# Patient Record
Sex: Male | Born: 1940 | Race: Black or African American | Hispanic: No | Marital: Single | Smoking: Former smoker
Health system: Southern US, Community
[De-identification: ages and names within clinical notes are randomized; demographics above are authoritative.]

## PROBLEM LIST (undated history)

## (undated) DIAGNOSIS — I10 Essential (primary) hypertension: Secondary | ICD-10-CM

## (undated) DIAGNOSIS — Z9049 Acquired absence of other specified parts of digestive tract: Secondary | ICD-10-CM

## (undated) DIAGNOSIS — F0280 Dementia in other diseases classified elsewhere without behavioral disturbance: Secondary | ICD-10-CM

## (undated) DIAGNOSIS — F70 Mild intellectual disabilities: Secondary | ICD-10-CM

## (undated) DIAGNOSIS — F1910 Other psychoactive substance abuse, uncomplicated: Secondary | ICD-10-CM

## (undated) DIAGNOSIS — Z8673 Personal history of transient ischemic attack (TIA), and cerebral infarction without residual deficits: Secondary | ICD-10-CM

## (undated) DIAGNOSIS — I509 Heart failure, unspecified: Secondary | ICD-10-CM

## (undated) DIAGNOSIS — C189 Malignant neoplasm of colon, unspecified: Secondary | ICD-10-CM

## (undated) DIAGNOSIS — G309 Alzheimer's disease, unspecified: Secondary | ICD-10-CM

## (undated) DIAGNOSIS — H409 Unspecified glaucoma: Secondary | ICD-10-CM

## (undated) DIAGNOSIS — F0390 Unspecified dementia without behavioral disturbance: Secondary | ICD-10-CM

## (undated) DIAGNOSIS — E785 Hyperlipidemia, unspecified: Secondary | ICD-10-CM

## (undated) DIAGNOSIS — F32A Depression, unspecified: Secondary | ICD-10-CM

## (undated) DIAGNOSIS — F329 Major depressive disorder, single episode, unspecified: Secondary | ICD-10-CM

## (undated) DIAGNOSIS — M109 Gout, unspecified: Secondary | ICD-10-CM

## (undated) HISTORY — DX: Unspecified dementia, unspecified severity, without behavioral disturbance, psychotic disturbance, mood disturbance, and anxiety: F03.90

## (undated) HISTORY — DX: Hyperlipidemia, unspecified: E78.5

## (undated) HISTORY — DX: Mild intellectual disabilities: F70

## (undated) HISTORY — DX: Malignant neoplasm of colon, unspecified: C18.9

## (undated) HISTORY — DX: Depression, unspecified: F32.A

## (undated) HISTORY — DX: Dementia in other diseases classified elsewhere, unspecified severity, without behavioral disturbance, psychotic disturbance, mood disturbance, and anxiety: F02.80

## (undated) HISTORY — DX: Acquired absence of other specified parts of digestive tract: Z90.49

## (undated) HISTORY — DX: Other psychoactive substance abuse, uncomplicated: F19.10

## (undated) HISTORY — DX: Gout, unspecified: M10.9

## (undated) HISTORY — DX: Essential (primary) hypertension: I10

## (undated) HISTORY — DX: Major depressive disorder, single episode, unspecified: F32.9

## (undated) HISTORY — DX: Unspecified glaucoma: H40.9

## (undated) HISTORY — DX: Alzheimer's disease, unspecified: G30.9

## (undated) HISTORY — DX: Heart failure, unspecified: I50.9

## (undated) HISTORY — DX: Personal history of transient ischemic attack (TIA), and cerebral infarction without residual deficits: Z86.73

---

## 1997-11-14 ENCOUNTER — Encounter (HOSPITAL_COMMUNITY): Admission: RE | Admit: 1997-11-14 | Discharge: 1998-02-12 | Payer: Self-pay | Admitting: Internal Medicine

## 1998-05-03 ENCOUNTER — Encounter: Payer: Self-pay | Admitting: Internal Medicine

## 1998-05-03 ENCOUNTER — Inpatient Hospital Stay (HOSPITAL_COMMUNITY): Admission: EM | Admit: 1998-05-03 | Discharge: 1998-05-07 | Payer: Self-pay

## 2003-10-14 ENCOUNTER — Encounter: Admission: RE | Admit: 2003-10-14 | Discharge: 2003-10-14 | Payer: Self-pay | Admitting: Internal Medicine

## 2004-02-03 ENCOUNTER — Ambulatory Visit: Payer: Self-pay | Admitting: Internal Medicine

## 2008-08-02 ENCOUNTER — Inpatient Hospital Stay (HOSPITAL_COMMUNITY): Admission: EM | Admit: 2008-08-02 | Discharge: 2008-08-18 | Payer: Self-pay | Admitting: Emergency Medicine

## 2008-08-02 ENCOUNTER — Ambulatory Visit: Payer: Self-pay | Admitting: Cardiology

## 2008-08-03 ENCOUNTER — Ambulatory Visit: Payer: Self-pay | Admitting: Gastroenterology

## 2008-08-04 ENCOUNTER — Encounter: Payer: Self-pay | Admitting: Gastroenterology

## 2008-08-05 ENCOUNTER — Encounter (INDEPENDENT_AMBULATORY_CARE_PROVIDER_SITE_OTHER): Payer: Self-pay | Admitting: Internal Medicine

## 2008-08-06 HISTORY — PX: COLECTOMY: SHX59

## 2008-08-07 ENCOUNTER — Encounter (INDEPENDENT_AMBULATORY_CARE_PROVIDER_SITE_OTHER): Payer: Self-pay | Admitting: General Surgery

## 2008-08-12 ENCOUNTER — Ambulatory Visit: Payer: Self-pay | Admitting: Oncology

## 2008-08-19 ENCOUNTER — Ambulatory Visit: Payer: Self-pay | Admitting: Oncology

## 2008-08-27 LAB — COMPREHENSIVE METABOLIC PANEL
Albumin: 3.6 g/dL (ref 3.5–5.2)
Alkaline Phosphatase: 97 U/L (ref 39–117)
CO2: 27 mEq/L (ref 19–32)
Calcium: 8.5 mg/dL (ref 8.4–10.5)
Chloride: 103 mEq/L (ref 96–112)
Glucose, Bld: 237 mg/dL — ABNORMAL HIGH (ref 70–99)
Potassium: 4 mEq/L (ref 3.5–5.3)
Sodium: 141 mEq/L (ref 135–145)
Total Protein: 7.1 g/dL (ref 6.0–8.3)

## 2008-08-27 LAB — CBC WITH DIFFERENTIAL/PLATELET
Eosinophils Absolute: 0.5 10*3/uL (ref 0.0–0.5)
HGB: 11.5 g/dL — ABNORMAL LOW (ref 13.0–17.1)
MONO#: 0.6 10*3/uL (ref 0.1–0.9)
NEUT#: 4.7 10*3/uL (ref 1.5–6.5)
RBC: 3.83 10*6/uL — ABNORMAL LOW (ref 4.20–5.82)
RDW: 16.3 % — ABNORMAL HIGH (ref 11.0–14.6)
WBC: 8.6 10*3/uL (ref 4.0–10.3)
lymph#: 2.8 10*3/uL (ref 0.9–3.3)

## 2008-09-10 ENCOUNTER — Ambulatory Visit (HOSPITAL_COMMUNITY): Admission: RE | Admit: 2008-09-10 | Discharge: 2008-09-10 | Payer: Self-pay | Admitting: Oncology

## 2008-11-19 ENCOUNTER — Emergency Department (HOSPITAL_COMMUNITY): Admission: EM | Admit: 2008-11-19 | Discharge: 2008-11-20 | Payer: Self-pay | Admitting: Emergency Medicine

## 2008-12-21 ENCOUNTER — Ambulatory Visit: Payer: Self-pay | Admitting: Oncology

## 2008-12-23 LAB — CBC WITH DIFFERENTIAL/PLATELET
BASO%: 0.5 % (ref 0.0–2.0)
Basophils Absolute: 0 10*3/uL (ref 0.0–0.1)
EOS%: 8.6 % — ABNORMAL HIGH (ref 0.0–7.0)
Eosinophils Absolute: 0.6 10*3/uL — ABNORMAL HIGH (ref 0.0–0.5)
HCT: 43.4 % (ref 38.4–49.9)
HGB: 14.1 g/dL (ref 13.0–17.1)
LYMPH%: 34.8 % (ref 14.0–49.0)
MCH: 29.7 pg (ref 27.2–33.4)
MCHC: 32.5 g/dL (ref 32.0–36.0)
MCV: 91.4 fL (ref 79.3–98.0)
MONO#: 0.5 10*3/uL (ref 0.1–0.9)
MONO%: 7.1 % (ref 0.0–14.0)
NEUT#: 3.6 10*3/uL (ref 1.5–6.5)
NEUT%: 49 % (ref 39.0–75.0)
Platelets: 325 10*3/uL (ref 140–400)
RBC: 4.75 10*6/uL (ref 4.20–5.82)
RDW: 15.7 % — ABNORMAL HIGH (ref 11.0–14.6)
WBC: 7.4 10*3/uL (ref 4.0–10.3)
lymph#: 2.6 10*3/uL (ref 0.9–3.3)

## 2008-12-23 LAB — COMPREHENSIVE METABOLIC PANEL
BUN: 11 mg/dL (ref 6–23)
CO2: 28 mEq/L (ref 19–32)
Calcium: 8.7 mg/dL (ref 8.4–10.5)
Chloride: 102 mEq/L (ref 96–112)
Creatinine, Ser: 1.01 mg/dL (ref 0.40–1.50)
Total Bilirubin: 0.4 mg/dL (ref 0.3–1.2)

## 2008-12-23 LAB — LACTATE DEHYDROGENASE: LDH: 191 U/L (ref 94–250)

## 2009-04-26 ENCOUNTER — Ambulatory Visit: Payer: Self-pay | Admitting: Oncology

## 2009-04-28 LAB — CBC WITH DIFFERENTIAL/PLATELET
BASO%: 1.2 % (ref 0.0–2.0)
EOS%: 5.7 % (ref 0.0–7.0)
HCT: 45.3 % (ref 38.4–49.9)
LYMPH%: 39.4 % (ref 14.0–49.0)
MCH: 31.3 pg (ref 27.2–33.4)
MCHC: 33.1 g/dL (ref 32.0–36.0)
MCV: 94.5 fL (ref 79.3–98.0)
MONO%: 7.3 % (ref 0.0–14.0)
NEUT%: 46.4 % (ref 39.0–75.0)
Platelets: 298 10*3/uL (ref 140–400)
RBC: 4.79 10*6/uL (ref 4.20–5.82)
WBC: 7.2 10*3/uL (ref 4.0–10.3)

## 2009-04-28 LAB — COMPREHENSIVE METABOLIC PANEL
ALT: 18 U/L (ref 0–53)
Alkaline Phosphatase: 100 U/L (ref 39–117)
CO2: 24 mEq/L (ref 19–32)
Creatinine, Ser: 1.12 mg/dL (ref 0.40–1.50)
Sodium: 142 mEq/L (ref 135–145)
Total Bilirubin: 0.5 mg/dL (ref 0.3–1.2)
Total Protein: 8 g/dL (ref 6.0–8.3)

## 2009-04-28 LAB — CEA: CEA: 1.6 ng/mL (ref 0.0–5.0)

## 2009-07-09 ENCOUNTER — Encounter: Payer: Self-pay | Admitting: Gastroenterology

## 2009-07-14 ENCOUNTER — Telehealth (INDEPENDENT_AMBULATORY_CARE_PROVIDER_SITE_OTHER): Payer: Self-pay | Admitting: *Deleted

## 2009-07-16 ENCOUNTER — Encounter (INDEPENDENT_AMBULATORY_CARE_PROVIDER_SITE_OTHER): Payer: Self-pay | Admitting: *Deleted

## 2009-09-06 ENCOUNTER — Ambulatory Visit (HOSPITAL_COMMUNITY): Admission: RE | Admit: 2009-09-06 | Discharge: 2009-09-06 | Payer: Self-pay | Admitting: Oncology

## 2009-09-13 ENCOUNTER — Ambulatory Visit: Payer: Self-pay | Admitting: Oncology

## 2009-09-14 LAB — CBC WITH DIFFERENTIAL/PLATELET
Basophils Absolute: 0 10*3/uL (ref 0.0–0.1)
EOS%: 5.7 % (ref 0.0–7.0)
Eosinophils Absolute: 0.4 10*3/uL (ref 0.0–0.5)
HCT: 42.3 % (ref 38.4–49.9)
HGB: 14.2 g/dL (ref 13.0–17.1)
MCH: 31.2 pg (ref 27.2–33.4)
MCV: 93 fL (ref 79.3–98.0)
MONO%: 8 % (ref 0.0–14.0)
NEUT#: 3.9 10*3/uL (ref 1.5–6.5)
NEUT%: 50 % (ref 39.0–75.0)
Platelets: 335 10*3/uL (ref 140–400)

## 2009-09-14 LAB — COMPREHENSIVE METABOLIC PANEL
AST: 18 U/L (ref 0–37)
Albumin: 3.8 g/dL (ref 3.5–5.2)
Alkaline Phosphatase: 120 U/L — ABNORMAL HIGH (ref 39–117)
BUN: 16 mg/dL (ref 6–23)
Calcium: 8.8 mg/dL (ref 8.4–10.5)
Chloride: 101 mEq/L (ref 96–112)
Creatinine, Ser: 1.15 mg/dL (ref 0.40–1.50)
Glucose, Bld: 138 mg/dL — ABNORMAL HIGH (ref 70–99)

## 2010-01-06 ENCOUNTER — Ambulatory Visit: Payer: Self-pay | Admitting: Oncology

## 2010-01-11 LAB — CBC WITH DIFFERENTIAL/PLATELET
BASO%: 0.5 % (ref 0.0–2.0)
EOS%: 6.7 % (ref 0.0–7.0)
HCT: 40.3 % (ref 38.4–49.9)
LYMPH%: 34.5 % (ref 14.0–49.0)
MCH: 31.1 pg (ref 27.2–33.4)
MCHC: 32.9 g/dL (ref 32.0–36.0)
MCV: 94.4 fL (ref 79.3–98.0)
MONO%: 8 % (ref 0.0–14.0)
NEUT%: 50.3 % (ref 39.0–75.0)
lymph#: 2.7 10*3/uL (ref 0.9–3.3)

## 2010-01-11 LAB — COMPREHENSIVE METABOLIC PANEL
ALT: 20 U/L (ref 0–53)
AST: 18 U/L (ref 0–37)
Alkaline Phosphatase: 123 U/L — ABNORMAL HIGH (ref 39–117)
Chloride: 99 mEq/L (ref 96–112)
Creatinine, Ser: 1.32 mg/dL (ref 0.40–1.50)
Total Bilirubin: 0.2 mg/dL — ABNORMAL LOW (ref 0.3–1.2)

## 2010-05-06 ENCOUNTER — Ambulatory Visit: Payer: Self-pay | Admitting: Oncology

## 2010-05-13 LAB — COMPREHENSIVE METABOLIC PANEL
ALT: 26 U/L (ref 0–53)
AST: 29 U/L (ref 0–37)
Albumin: 3.8 g/dL (ref 3.5–5.2)
Alkaline Phosphatase: 135 U/L — ABNORMAL HIGH (ref 39–117)
BUN: 17 mg/dL (ref 6–23)
CO2: 27 mEq/L (ref 19–32)
Calcium: 8.6 mg/dL (ref 8.4–10.5)
Chloride: 100 mEq/L (ref 96–112)
Creatinine, Ser: 1.52 mg/dL — ABNORMAL HIGH (ref 0.40–1.50)
Glucose, Bld: 322 mg/dL — ABNORMAL HIGH (ref 70–99)
Potassium: 4.3 mEq/L (ref 3.5–5.3)
Sodium: 137 mEq/L (ref 135–145)
Total Bilirubin: 0.3 mg/dL (ref 0.3–1.2)
Total Protein: 7.8 g/dL (ref 6.0–8.3)

## 2010-05-13 LAB — CBC WITH DIFFERENTIAL/PLATELET
BASO%: 0.5 % (ref 0.0–2.0)
Basophils Absolute: 0 10*3/uL (ref 0.0–0.1)
EOS%: 5.2 % (ref 0.0–7.0)
Eosinophils Absolute: 0.4 10*3/uL (ref 0.0–0.5)
HCT: 40 % (ref 38.4–49.9)
HGB: 13.2 g/dL (ref 13.0–17.1)
LYMPH%: 29.4 % (ref 14.0–49.0)
MCH: 31.5 pg (ref 27.2–33.4)
MCHC: 33 g/dL (ref 32.0–36.0)
MCV: 95.2 fL (ref 79.3–98.0)
MONO#: 0.6 10*3/uL (ref 0.1–0.9)
MONO%: 8.1 % (ref 0.0–14.0)
NEUT#: 4.2 10*3/uL (ref 1.5–6.5)
NEUT%: 56.8 % (ref 39.0–75.0)
Platelets: 296 10*3/uL (ref 140–400)
RBC: 4.21 10*6/uL (ref 4.20–5.82)
RDW: 14.1 % (ref 11.0–14.6)
WBC: 7.4 10*3/uL (ref 4.0–10.3)
lymph#: 2.2 10*3/uL (ref 0.9–3.3)

## 2010-05-13 LAB — CEA: CEA: 1.5 ng/mL (ref 0.0–5.0)

## 2010-05-28 ENCOUNTER — Other Ambulatory Visit: Payer: Self-pay | Admitting: Oncology

## 2010-05-28 DIAGNOSIS — C189 Malignant neoplasm of colon, unspecified: Secondary | ICD-10-CM

## 2010-06-07 NOTE — Progress Notes (Signed)
Summary: Colon  Phone Note Outgoing Call Call back at Home Phone [phone]   Call placed by: Chales Abrahams CMA Duncan Dull),  July 14, 2009 9:08 AM Summary of Call: called to schedule pt for repeat colon left message on machine to call back  Initial call taken by: Chales Abrahams CMA Duncan Dull),  July 14, 2009 9:08 AM  Follow-up for Phone Call        left message on machine to call back Chales Abrahams CMA Duncan Dull)  July 15, 2009 9:17 AM   left message on machine to call back letter mailed  Follow-up by: Chales Abrahams CMA Duncan Dull),  July 16, 2009 10:37 AM

## 2010-06-07 NOTE — Procedures (Signed)
Summary: Recall / [facility]  Recall / [facility]   Imported By: Lennie Odor 10/07/2009 15:42:29  _____________________________________________________________________  External Attachment:    Type:   Image     Comment:   External Document

## 2010-06-07 NOTE — Letter (Signed)
Summary: Appointment Reminder  [facility]  [address] [place], [place] [address]   Phone: [phone]  Fax: [phone]        July 16, 2009 MRN: [id number]    [name] [address] [place], [place]  [address]    Dear [name],   We have been unable to reach you by phone to schedule a follow up   Hospital Colonoscopy that was recommended for you by Dr. Christella Hartigan.  It is   important that we reach you to schedule an appointment. We hope that you  allow [place] to participate in your health care needs. Please contact us at  [phone] at your earliest convenience to schedule your appointment.     Sincerely,    Chales Abrahams CMA (AAMA)  Appended Document: Appointment Reminder letter mailed

## 2010-07-26 LAB — GLUCOSE, CAPILLARY: Glucose-Capillary: 220 mg/dL — ABNORMAL HIGH (ref 70–99)

## 2010-08-16 LAB — GLUCOSE, CAPILLARY: Glucose-Capillary: 111 mg/dL — ABNORMAL HIGH (ref 70–99)

## 2010-08-16 IMAGING — PT NM PET TUM IMG RESTAG (PS) SKULL BASE T - THIGH
6 series · 25 of 25 positions shown · non-contrast
Comparison: 09/10/2008

CLINICAL DATA: Colon cancer. Subsequent treatment strategy.

NUCLEAR MEDICINE PET/CT
TECHNIQUE: 17.9 mCi F-18 FDG was injected intravenously.  Full-
ring PET imaging was performed from the skull base through the mid-
thighs.  CT data was obtained and used for attenuation correction
and anatomic localization only.  (This was not acquired as a
diagnostic CT examination.)  Data regarding site of injection,
patient weight, post injection waiting period, and fasting blood
sugar is available in the PACS documentation.

[Series 1: pet ac · axial · 3.3mm · 4.69mm/px · z∈[-890,-20]mm · 5 of 267 slices shown]
[im 1/267]
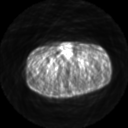
[im 67/267]
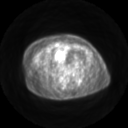
[im 134/267]
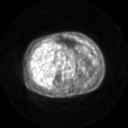
[im 200/267]
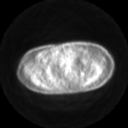
[im 267/267]
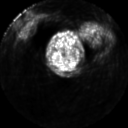

[Series 2: ct images · axial · 3.8mm · 0.98mm/px · z∈[-890,-20]mm · 5 of 263 slices shown]
[im 1/263]
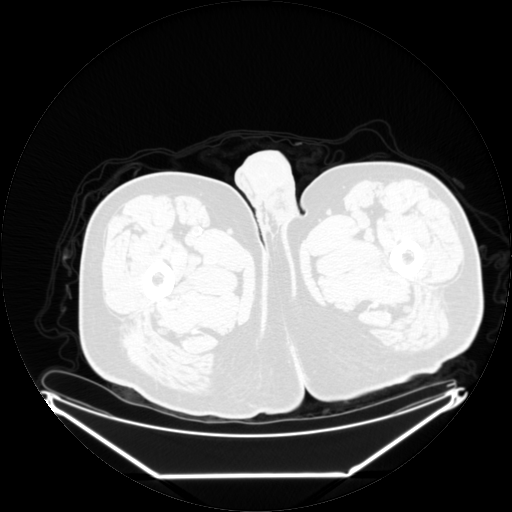
[im 66/263]
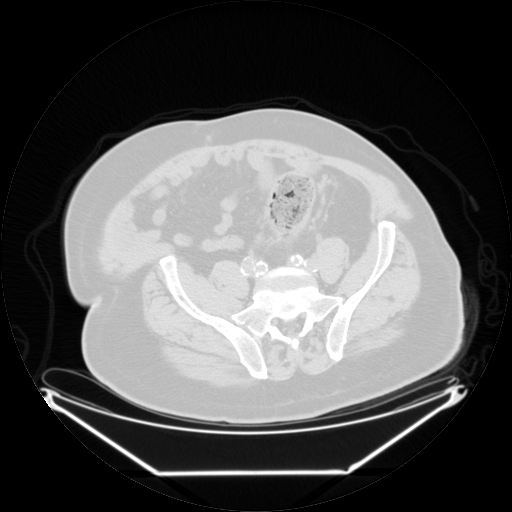
[im 132/263]
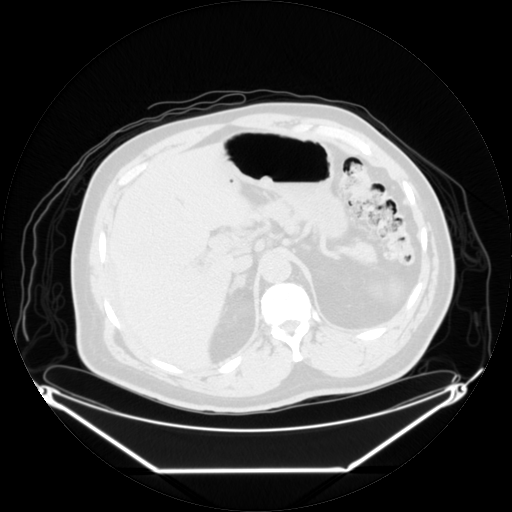
[im 197/263]
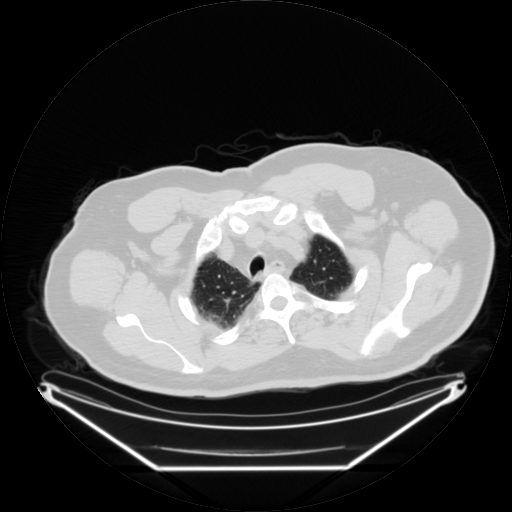
[im 263/263  brain]
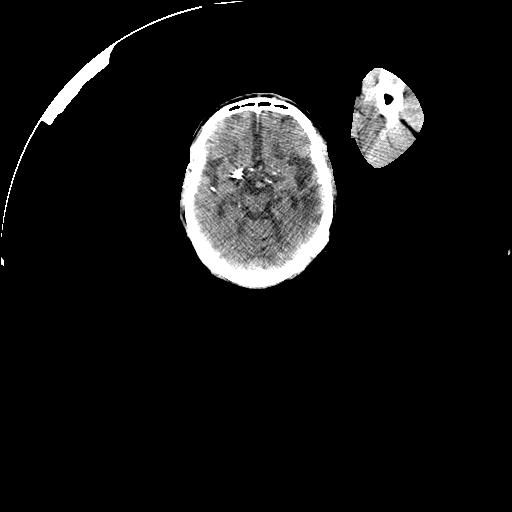

[Series 2: pet nac · axial · 3.3mm · 4.69mm/px · z∈[-890,-20]mm · 6 of 267 slices shown]
[im 1/267]
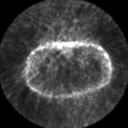
[im 54/267]
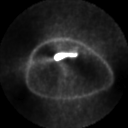
[im 107/267]
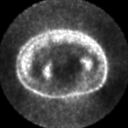
[im 160/267]
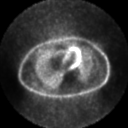
[im 213/267]
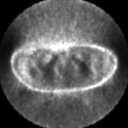
[im 267/267]
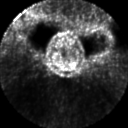

[Series 123: mip · coronal · 3.3mm · 4.69mm/px · 1 of 30 slices shown]
[im 1/30]
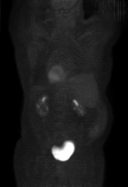

[Series 151: reformatted · axial · 3.3mm · 3.89mm/px · z∈[-890,-20]mm · 6 of 265 slices shown (1 of 2)]
[im 1/265]
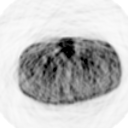
[im 53/265]
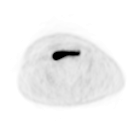
[im 106/265]
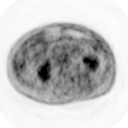
[im 159/265]
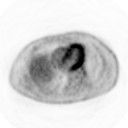
[im 212/265]
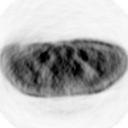
[im 265/265]
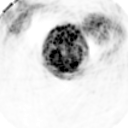

[Series 153: reformatted · coronal · 4.7mm · 6.98mm/px · 2 of 81 slices shown (2 of 2)]
[im 1/81]
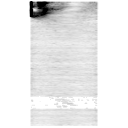
[im 81/81]
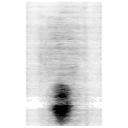

[25 of 25 positions shown; findings below may reference images not displayed]

FINDINGS: The small focus of hypermetabolic activity previously
along the patient's skin of the left back has resolved.

No suspicious hypermetabolic activity is identified in the neck,
chest, abdomen, or pelvis to suggest malignancy.  No significant
abnormal hepatic lesion is noted.  There is atherosclerosis of the
abdominal aorta.

There is evidence of chronic sphenoid and right maxillary
sinusitis.

The gallbladder appears thickened and has multiple internal large
gallstones with nitrogen gas phenomenon.  The stones measure up to
2.3 cm in diameter.

There is an unusual appearance of peripheral layered low density
along the right hepatic lobe on images 132-151 of the CT data.
Density measurement of this vicinity is approximately 20 HU as
compared to 58 HU in the adjacent normal-appearing liver.  This
probably represents geographic focal fatty infiltration of the
liver.

Small juxtadiaphragmatic lymph nodes are present but do not appear
hypermetabolic.

There is prominence of stool in the rectum, such that mild
impaction is not excluded.
IMPRESSION: 1.  No specific hypermetabolic activity to suggest metastatic
disease is identified in the neck, chest, abdomen, or pelvis.
2.  Geographic fatty infiltration of the liver peripherally in the
right hepatic lobe.
3.  Prominent gallbladder appears distended with gallstones.
4.  Prominence of stool in the rectum - cannot exclude mild
impaction.

## 2010-08-17 LAB — CBC
HCT: 31.3 % — ABNORMAL LOW (ref 39.0–52.0)
HCT: 32.9 % — ABNORMAL LOW (ref 39.0–52.0)
HCT: 34.3 % — ABNORMAL LOW (ref 39.0–52.0)
HCT: 34.4 % — ABNORMAL LOW (ref 39.0–52.0)
HCT: 34.8 % — ABNORMAL LOW (ref 39.0–52.0)
Hemoglobin: 10.6 g/dL — ABNORMAL LOW (ref 13.0–17.0)
Hemoglobin: 10.7 g/dL — ABNORMAL LOW (ref 13.0–17.0)
Hemoglobin: 11.2 g/dL — ABNORMAL LOW (ref 13.0–17.0)
Hemoglobin: 11.3 g/dL — ABNORMAL LOW (ref 13.0–17.0)
MCHC: 32.6 g/dL (ref 30.0–36.0)
MCHC: 33 g/dL (ref 30.0–36.0)
MCHC: 33.1 g/dL (ref 30.0–36.0)
MCV: 92.2 fL (ref 78.0–100.0)
MCV: 93.5 fL (ref 78.0–100.0)
MCV: 93.7 fL (ref 78.0–100.0)
MCV: 93.8 fL (ref 78.0–100.0)
Platelets: 362 10*3/uL (ref 150–400)
Platelets: 366 10*3/uL (ref 150–400)
Platelets: 399 10*3/uL (ref 150–400)
RBC: 3.41 MIL/uL — ABNORMAL LOW (ref 4.22–5.81)
RBC: 3.42 MIL/uL — ABNORMAL LOW (ref 4.22–5.81)
RBC: 3.53 MIL/uL — ABNORMAL LOW (ref 4.22–5.81)
RBC: 3.66 MIL/uL — ABNORMAL LOW (ref 4.22–5.81)
RBC: 3.67 MIL/uL — ABNORMAL LOW (ref 4.22–5.81)
RBC: 3.82 MIL/uL — ABNORMAL LOW (ref 4.22–5.81)
RDW: 16 % — ABNORMAL HIGH (ref 11.5–15.5)
RDW: 16.1 % — ABNORMAL HIGH (ref 11.5–15.5)
RDW: 16.2 % — ABNORMAL HIGH (ref 11.5–15.5)
RDW: 16.6 % — ABNORMAL HIGH (ref 11.5–15.5)
WBC: 10 10*3/uL (ref 4.0–10.5)
WBC: 7.3 10*3/uL (ref 4.0–10.5)
WBC: 9.1 10*3/uL (ref 4.0–10.5)
WBC: 9.2 10*3/uL (ref 4.0–10.5)

## 2010-08-17 LAB — COMPREHENSIVE METABOLIC PANEL
ALT: 18 U/L (ref 0–53)
ALT: 48 U/L (ref 0–53)
AST: 18 U/L (ref 0–37)
AST: 27 U/L (ref 0–37)
Albumin: 2.7 g/dL — ABNORMAL LOW (ref 3.5–5.2)
Alkaline Phosphatase: 112 U/L (ref 39–117)
BUN: 4 mg/dL — ABNORMAL LOW (ref 6–23)
CO2: 29 mEq/L (ref 19–32)
CO2: 29 mEq/L (ref 19–32)
Calcium: 8.6 mg/dL (ref 8.4–10.5)
Chloride: 103 mEq/L (ref 96–112)
Chloride: 106 mEq/L (ref 96–112)
Creatinine, Ser: 0.86 mg/dL (ref 0.4–1.5)
Creatinine, Ser: 0.97 mg/dL (ref 0.4–1.5)
GFR calc Af Amer: >60 mL/min (ref >60)
GFR calc Af Amer: >60 mL/min (ref >60)
GFR calc non Af Amer: >60 mL/min (ref >60)
GFR calc non Af Amer: >60 mL/min (ref >60)
Potassium: 4 mEq/L (ref 3.5–5.1)
Sodium: 135 mEq/L (ref 135–145)
Sodium: 140 mEq/L (ref 135–145)
Total Bilirubin: 0.4 mg/dL (ref 0.3–1.2)
Total Bilirubin: 0.6 mg/dL (ref 0.3–1.2)
Total Protein: 6.4 g/dL (ref 6.0–8.3)
Total Protein: 7.3 g/dL (ref 6.0–8.3)

## 2010-08-17 LAB — GLUCOSE, CAPILLARY
Glucose-Capillary: 128 mg/dL — ABNORMAL HIGH (ref 70–99)
Glucose-Capillary: 138 mg/dL — ABNORMAL HIGH (ref 70–99)
Glucose-Capillary: 143 mg/dL — ABNORMAL HIGH (ref 70–99)
Glucose-Capillary: 146 mg/dL — ABNORMAL HIGH (ref 70–99)
Glucose-Capillary: 149 mg/dL — ABNORMAL HIGH (ref 70–99)
Glucose-Capillary: 170 mg/dL — ABNORMAL HIGH (ref 70–99)
Glucose-Capillary: 174 mg/dL — ABNORMAL HIGH (ref 70–99)
Glucose-Capillary: 176 mg/dL — ABNORMAL HIGH (ref 70–99)
Glucose-Capillary: 176 mg/dL — ABNORMAL HIGH (ref 70–99)
Glucose-Capillary: 177 mg/dL — ABNORMAL HIGH (ref 70–99)
Glucose-Capillary: 177 mg/dL — ABNORMAL HIGH (ref 70–99)
Glucose-Capillary: 184 mg/dL — ABNORMAL HIGH (ref 70–99)
Glucose-Capillary: 189 mg/dL — ABNORMAL HIGH (ref 70–99)
Glucose-Capillary: 191 mg/dL — ABNORMAL HIGH (ref 70–99)
Glucose-Capillary: 194 mg/dL — ABNORMAL HIGH (ref 70–99)
Glucose-Capillary: 199 mg/dL — ABNORMAL HIGH (ref 70–99)
Glucose-Capillary: 202 mg/dL — ABNORMAL HIGH (ref 70–99)
Glucose-Capillary: 203 mg/dL — ABNORMAL HIGH (ref 70–99)
Glucose-Capillary: 208 mg/dL — ABNORMAL HIGH (ref 70–99)
Glucose-Capillary: 210 mg/dL — ABNORMAL HIGH (ref 70–99)
Glucose-Capillary: 211 mg/dL — ABNORMAL HIGH (ref 70–99)
Glucose-Capillary: 216 mg/dL — ABNORMAL HIGH (ref 70–99)
Glucose-Capillary: 230 mg/dL — ABNORMAL HIGH (ref 70–99)
Glucose-Capillary: 231 mg/dL — ABNORMAL HIGH (ref 70–99)
Glucose-Capillary: 237 mg/dL — ABNORMAL HIGH (ref 70–99)
Glucose-Capillary: 247 mg/dL — ABNORMAL HIGH (ref 70–99)
Glucose-Capillary: 248 mg/dL — ABNORMAL HIGH (ref 70–99)
Glucose-Capillary: 252 mg/dL — ABNORMAL HIGH (ref 70–99)
Glucose-Capillary: 255 mg/dL — ABNORMAL HIGH (ref 70–99)
Glucose-Capillary: 279 mg/dL — ABNORMAL HIGH (ref 70–99)
Glucose-Capillary: 284 mg/dL — ABNORMAL HIGH (ref 70–99)
Glucose-Capillary: 296 mg/dL — ABNORMAL HIGH (ref 70–99)
Glucose-Capillary: 305 mg/dL — ABNORMAL HIGH (ref 70–99)
Glucose-Capillary: 308 mg/dL — ABNORMAL HIGH (ref 70–99)
Glucose-Capillary: 324 mg/dL — ABNORMAL HIGH (ref 70–99)
Glucose-Capillary: 325 mg/dL — ABNORMAL HIGH (ref 70–99)
Glucose-Capillary: 364 mg/dL — ABNORMAL HIGH (ref 70–99)

## 2010-08-17 LAB — BASIC METABOLIC PANEL
BUN: 2 mg/dL — ABNORMAL LOW (ref 6–23)
BUN: 3 mg/dL — ABNORMAL LOW (ref 6–23)
BUN: 3 mg/dL — ABNORMAL LOW (ref 6–23)
CO2: 26 mEq/L (ref 19–32)
CO2: 26 mEq/L (ref 19–32)
CO2: 27 mEq/L (ref 19–32)
CO2: 29 mEq/L (ref 19–32)
CO2: 29 mEq/L (ref 19–32)
CO2: 30 mEq/L (ref 19–32)
CO2: 31 mEq/L (ref 19–32)
Calcium: 8.1 mg/dL — ABNORMAL LOW (ref 8.4–10.5)
Calcium: 8.6 mg/dL (ref 8.4–10.5)
Calcium: 8.6 mg/dL (ref 8.4–10.5)
Chloride: 101 mEq/L (ref 96–112)
Chloride: 103 mEq/L (ref 96–112)
Chloride: 103 mEq/L (ref 96–112)
Chloride: 103 mEq/L (ref 96–112)
Chloride: 104 mEq/L (ref 96–112)
Chloride: 105 mEq/L (ref 96–112)
Creatinine, Ser: 0.89 mg/dL (ref 0.4–1.5)
Creatinine, Ser: 1.06 mg/dL (ref 0.4–1.5)
GFR calc Af Amer: >60 mL/min (ref >60)
GFR calc Af Amer: >60 mL/min (ref >60)
GFR calc Af Amer: >60 mL/min (ref >60)
GFR calc Af Amer: >60 mL/min (ref >60)
GFR calc Af Amer: >60 mL/min (ref >60)
GFR calc Af Amer: >60 mL/min (ref >60)
GFR calc non Af Amer: >60 mL/min (ref >60)
GFR calc non Af Amer: >60 mL/min (ref >60)
GFR calc non Af Amer: >60 mL/min (ref >60)
GFR calc non Af Amer: >60 mL/min (ref >60)
GFR calc non Af Amer: >60 mL/min (ref >60)
Glucose, Bld: 112 mg/dL — ABNORMAL HIGH (ref 70–99)
Glucose, Bld: 182 mg/dL — ABNORMAL HIGH (ref 70–99)
Glucose, Bld: 185 mg/dL — ABNORMAL HIGH (ref 70–99)
Glucose, Bld: 234 mg/dL — ABNORMAL HIGH (ref 70–99)
Glucose, Bld: 299 mg/dL — ABNORMAL HIGH (ref 70–99)
Glucose, Bld: 323 mg/dL — ABNORMAL HIGH (ref 70–99)
Potassium: 3.4 mEq/L — ABNORMAL LOW (ref 3.5–5.1)
Potassium: 3.4 mEq/L — ABNORMAL LOW (ref 3.5–5.1)
Potassium: 3.8 mEq/L (ref 3.5–5.1)
Potassium: 3.8 mEq/L (ref 3.5–5.1)
Potassium: 3.9 mEq/L (ref 3.5–5.1)
Potassium: 4 mEq/L (ref 3.5–5.1)
Potassium: 4.2 mEq/L (ref 3.5–5.1)
Sodium: 133 mEq/L — ABNORMAL LOW (ref 135–145)
Sodium: 135 mEq/L (ref 135–145)
Sodium: 137 mEq/L (ref 135–145)
Sodium: 138 mEq/L (ref 135–145)
Sodium: 138 mEq/L (ref 135–145)
Sodium: 139 mEq/L (ref 135–145)
Sodium: 139 mEq/L (ref 135–145)

## 2010-08-17 LAB — DIFFERENTIAL
Basophils Absolute: 0 10*3/uL (ref 0.0–0.1)
Basophils Relative: 1 % (ref 0–1)
Eosinophils Absolute: 0.7 10*3/uL (ref 0.0–0.7)
Eosinophils Relative: 7 % — ABNORMAL HIGH (ref 0–5)
Lymphocytes Relative: 25 % (ref 12–46)
Lymphs Abs: 2.7 10*3/uL (ref 0.7–4.0)
Monocytes Absolute: 0.7 10*3/uL (ref 0.1–1.0)
Monocytes Relative: 9 % (ref 3–12)
Neutro Abs: 4.8 10*3/uL (ref 1.7–7.7)

## 2010-08-17 LAB — URINALYSIS, ROUTINE W REFLEX MICROSCOPIC
Protein, ur: 100 mg/dL — AB
Specific Gravity, Urine: 1.017 (ref 1.005–1.030)
Urobilinogen, UA: 0.2 mg/dL (ref 0.0–1.0)

## 2010-08-17 LAB — URINE MICROSCOPIC-ADD ON

## 2010-08-17 LAB — PHOSPHORUS: Phosphorus: 3.5 mg/dL (ref 2.3–4.6)

## 2010-08-17 LAB — CHOLESTEROL, TOTAL: Cholesterol: 116 mg/dL (ref 0–200)

## 2010-08-17 LAB — TRIGLYCERIDES: Triglycerides: 96 mg/dL (ref <150)

## 2010-08-17 LAB — PREALBUMIN: Prealbumin: 13.3 mg/dL — ABNORMAL LOW (ref 18.0–45.0)

## 2010-08-18 LAB — CBC
HCT: 32.2 % — ABNORMAL LOW (ref 39.0–52.0)
HCT: 32.8 % — ABNORMAL LOW (ref 39.0–52.0)
HCT: 37.2 % — ABNORMAL LOW (ref 39.0–52.0)
Hemoglobin: 11.8 g/dL — ABNORMAL LOW (ref 13.0–17.0)
MCHC: 31.8 g/dL (ref 30.0–36.0)
MCHC: 32.5 g/dL (ref 30.0–36.0)
MCV: 92.6 fL (ref 78.0–100.0)
Platelets: 329 10*3/uL (ref 150–400)
Platelets: 336 10*3/uL (ref 150–400)
Platelets: ADEQUATE 10*3/uL (ref 150–400)
RDW: 16 % — ABNORMAL HIGH (ref 11.5–15.5)
RDW: 16.5 % — ABNORMAL HIGH (ref 11.5–15.5)
RDW: 16.7 % — ABNORMAL HIGH (ref 11.5–15.5)
WBC: 8.6 10*3/uL (ref 4.0–10.5)

## 2010-08-18 LAB — LIPID PANEL
Triglycerides: 58 mg/dL (ref <150)
VLDL: 12 mg/dL (ref 0–40)

## 2010-08-18 LAB — PROTIME-INR
INR: 1.1 (ref 0.00–1.49)
Prothrombin Time: 14.6 seconds (ref 11.6–15.2)

## 2010-08-18 LAB — DIFFERENTIAL
Basophils Absolute: 0.1 10*3/uL (ref 0.0–0.1)
Eosinophils Absolute: 0.6 10*3/uL (ref 0.0–0.7)
Lymphs Abs: 2.6 10*3/uL (ref 0.7–4.0)
Neutro Abs: 5.6 10*3/uL (ref 1.7–7.7)

## 2010-08-18 LAB — BASIC METABOLIC PANEL
BUN: 12 mg/dL (ref 6–23)
BUN: 3 mg/dL — ABNORMAL LOW (ref 6–23)
CO2: 27 mEq/L (ref 19–32)
CO2: 27 mEq/L (ref 19–32)
CO2: 30 mEq/L (ref 19–32)
Calcium: 8.3 mg/dL — ABNORMAL LOW (ref 8.4–10.5)
Chloride: 104 mEq/L (ref 96–112)
Creatinine, Ser: 0.94 mg/dL (ref 0.4–1.5)
GFR calc Af Amer: >60 mL/min (ref >60)
Glucose, Bld: 143 mg/dL — ABNORMAL HIGH (ref 70–99)
Glucose, Bld: 173 mg/dL — ABNORMAL HIGH (ref 70–99)
Glucose, Bld: 357 mg/dL — ABNORMAL HIGH (ref 70–99)
Potassium: 3.8 mEq/L (ref 3.5–5.1)
Potassium: 4.2 mEq/L (ref 3.5–5.1)
Sodium: 135 mEq/L (ref 135–145)

## 2010-08-18 LAB — IRON AND TIBC
Iron: 40 ug/dL — ABNORMAL LOW (ref 42–135)
TIBC: 272 ug/dL (ref 215–435)

## 2010-08-18 LAB — HEPATIC FUNCTION PANEL
ALT: 14 U/L (ref 0–53)
AST: 15 U/L (ref 0–37)
Alkaline Phosphatase: 90 U/L (ref 39–117)
Bilirubin, Direct: 0.1 mg/dL (ref 0.0–0.3)
Total Bilirubin: 0.5 mg/dL (ref 0.3–1.2)

## 2010-08-18 LAB — CEA: CEA: 35.5 ng/mL — ABNORMAL HIGH (ref 0.0–5.0)

## 2010-08-18 LAB — VITAMIN B12: Vitamin B-12: 223 pg/mL (ref 211–911)

## 2010-08-18 LAB — GLUCOSE, CAPILLARY
Glucose-Capillary: 167 mg/dL — ABNORMAL HIGH (ref 70–99)
Glucose-Capillary: 174 mg/dL — ABNORMAL HIGH (ref 70–99)
Glucose-Capillary: 189 mg/dL — ABNORMAL HIGH (ref 70–99)
Glucose-Capillary: 224 mg/dL — ABNORMAL HIGH (ref 70–99)
Glucose-Capillary: 242 mg/dL — ABNORMAL HIGH (ref 70–99)

## 2010-08-18 LAB — RETICULOCYTES: Retic Count, Absolute: 64.6 10*3/uL (ref 19.0–186.0)

## 2010-08-18 LAB — TSH: TSH: 1.897 u[IU]/mL (ref 0.350–4.500)

## 2010-09-19 ENCOUNTER — Inpatient Hospital Stay (HOSPITAL_COMMUNITY): Admission: RE | Admit: 2010-09-19 | Payer: Self-pay | Source: Ambulatory Visit

## 2010-09-20 NOTE — Discharge Summary (Signed)
[name], [name]             ACCOUNT NO.:  [id number]   MEDICAL RECORD NO.:  [id number]          PATIENT TYPE:  INP   LOCATION:  1336                         FACILITY:  [facility]   PHYSICIAN:  Isidor Holts, M.D.  DATE OF BIRTH:  Jun 03, 1940   DATE OF ADMISSION:  08/02/2008  DATE OF DISCHARGE:                               DISCHARGE SUMMARY   ADDENDUM:   DATE OF DISCHARGE:  To be determined.   DISCHARGE DIAGNOSES:  1. T3 N1 sigmoid colon carcinoma, status post sigmoidectomy with      primary anastomosis August 07, 2008.  2. Postoperative ileus.  3. Hypertension.  4. History of congenital heart disease.  5. History of cerebrovascular disease.  6. Type 2 diabetes mellitus, insulin-requiring.  7. Gout.  8. Dyslipidemia.  9. Dementia/depression.  10.Mental retardation.  11.Glaucoma.  12.History of polycystic kidneys.  13.Urinary tract infection.   DISCHARGE MEDICATIONS:  1. Aspirin 81 mg p.o. daily.  2. Zyloprim 100 mg p.o. daily (was on 100 mg p.o. alternate days).  3. Altace 2.5 mg p.o. daily.  4. Namenda 10 mg p.o. b.i.d.  5. Aricept 10 mg p.o. nightly.  6. Travatan 0.004% ophthalmic solution 1 drop each eye nightly.  7. Betoptic S 0.25% ophthalmic drops 1 drop each eye 2 times daily.  8. Zocor 40 mg p.o. nightly.  9. Levaquin 500 mg p.o. daily, to be completed on August 23, 2008.  10.Lantus insulin 50 units subcutaneously every q.12 hourly (was on 36      units subcutaneously every morning and 66 units subcutaneously      every evening).  11.NovoLog insulin per slining scale(was on NovoLog 5 units      subcutaneously b.i.d).  ie CBG 70-120, No insulin; CBG 121-150, 3 units; CBG 151-200, 4 units;  CBG 201-250, 7 units; CBG 251-300, 11 units; 301-350, 15 units; 351-400,  20 units.   Note:  Potassium chloride, Maxzide, Iron sulfate and Adalat have been  discontinued, until reviewed by Dr. Frederik Pear, primary MD.   This medication list may, of course, be  updated/modified at the time of  actual discharge.   CLINICAL COURSE:  For details of admission history, refer to admission  notes of August 02, 2008, dictated by Dr. Vania Rea.  Also refer  to consultation notes of  August 04, 2008, dictated by Dr. Claud Kelp  and of August 06, 2008, dictated by Dr. Antonietta Breach.  For details of  surgical procedure, refer to operative note of August 07, 2008, dictated  by Dr. Claud Kelp.  For details of subsequent clinical course, refer  to interim summary dictated August 11, 2008, by Dr. Theodosia Paling.  For  the period, however, from August 12, 2008 to August 16, 2008, i.e. the date  of this dictation, the following are pertinent:   The patient did develop postoperative ileus following his surgery.  He  was, therefore, continued on NG tube suction, intravenous fluids and  parenteral TNA.  By August 14, 2008, however, postoperative ileus had  resolved and patient was able to tolerate diet, which was subsequently  advanced and TNA was discontinued on  August 15, 2008.  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  Oncology consultation was kindly provided by Dr. Eli Hose.  For  details of that consultation, refer to consultation notes of August 12, 2008.  Dr. Clelia Croft recommended continued supportive care, but will see  the patient on an outpatient basis and will arrange PET/CT scan for  restaging workup.  The decision as to whether or not patient will  benefit from chemotherapy will depend on his staging CT scans, as well  as discussion with his family regarding risks and benefits, although he  is felt not to be a great candidate for systemic chemotherapy, because  of his poor performance status and multiple comorbidities, as well as  diminished capacity and limited quality of life.  Hypertension remained  controlled during the course of patient's hospitalization.  There were  no problems  referable to gout.  He continues on pre-admission doses of  statin and has remained stable from the point of view of his dementia.  Diabetes mellitus has proven somewhat difficult to control, particularly  during the period of time with patient was on parenteral TNA.  However,  with discontinuation of TNA and titration of patient's scheduled Lantus  insulin as well as sliding-scale insulin coverage, it is anticipated  that over the next few days this will come satisfactorily under control.  The patient was noted to have a positive urinary sediment following  urinalysis on August 15, 2008, with many bacteria.  He, however, has  urinary tract symptoms.  Foley catheter was discontinued on August 15, 2008.  This is, therefore, asymptomatic bacteriuria.  However, given the  fact that patient is diabetic, we have elected to treat with a 7-day  course of Levaquin.   PROCEDURES:  1. Abdominal x-ray dated August 10, 2008.  This showed postoperative      ileus without evidence for free intraperitoneal air.  2. Chest x-ray dated August 11, 2008.  This showed right upper extremity      PICC line in satisfactory position, tip in distal SVC, no      pneumothorax identified.  3. Chest x-ray dated August 15, 2008.  This showed PICC line now      terminating in mid right subclavian vein, low lung volumes without      focal airspace disease.   DISPOSITION:  The patient on August 16, 2008, was clearly nearing  discharge.  Ileus had resolved.  The patient was asymptomatic and was  ambulating with assistance.  The only issue is of uncontrolled diabetes  mellitus, but as mentioned above, insulin regimen is being titrated  accordingly.  It is anticipated that in the next few days the patient  will recover sufficiently clinically and provided he is cleared by the  surgical team, he will be discharged.  This is anticipated to occur on  or about August 18, 2008.   DIET:  Heart-healthy/carbohydrate modified.    ACTIVITY:  As tolerated, otherwise per PT/OT.   WOUND CARE:  Per surgical recommendations.   FOLLOW-UP INSTRUCTIONS:  The patient is to follow up with his primary  MD, Dr. Frederik Pear, [facility], per prior scheduled  appointment, routinely.  He is to follow up with Dr. Eli Hose,  oncologist, on a date to be scheduled, at the [facility],  telephone number [phone].  In addition, he is to follow up with Dr.  Claud Kelp, general surgeon, on a date to be  determined.  Appropriate information has been provided.      Isidor Holts, M.D.  Electronically Signed     CO/MEDQ  D:  08/16/2008  T:  08/16/2008  Job:  [id number]   cc:   [name]. [name], M.D.  Fax: [phone]   Blenda Nicely. [place]  Fax: [phone]   Angelia Mould. Derrell Lolling, M.D.  [address], [address]  [place]  [place] [address]

## 2010-09-20 NOTE — H&P (Signed)
NAMEHURLEY, SOBEL NO.:  [id number]   MEDICAL RECORD NO.:  [id number]          PATIENT TYPE:  EMS   LOCATION:  ED                           [facility]ACILITY:  [facility]   PHYSICIAN:  Vania Rea, M.D. DATE OF BIRTH:  Oct 13, 1940   DATE OF ADMISSION:  08/02/2008  DATE OF DISCHARGE:                              HISTORY & PHYSICAL   CHIEF COMPLAINT:  Bloody stool.   HISTORY OF PRESENT ILLNESS:  This is a 70 year old gentleman with a  history of Alzheimer's dementia, diabetes, stroke, polycystic kidney  among other multiple medical problems who lives at [facility] assisted  living facility and was brought to the emergency room because of an  incidental finding of maroon-colored stool and blood clots in his  diapers.   The patient has no complaints.  There is no history of chest pains or  shortness breath.  There is no history of headaches.  There is no  history of syncope.  He does ambulate with the assistance of a walker.  The patient's code status is do not resuscitate, but the a nursing home  and family indicate that they want everything done to evaluate the  patient as to the cause of his bleeding.   PAST MEDICAL HISTORY:  1. Hypertension.  2. Diabetes.  3. Cerebrovascular disease.  4. History of congenital heart disease.  5. Alzheimer's dementia.  6. Depression.  7. Glaucoma.  8. Gout.  9. Hyperlipidemia.  10.History of polycystic kidney.   MEDICATIONS:  1. Lantus 36 units each morning and 66 units each afternoon.  2. NovoLog 5 units twice daily.  3. Zyloprim 100 mg every other day.  4. Aspirin 81 mg daily.  5. Potassium chloride 10 mEq daily.  6. Maxzide 25/325 daily.  7. Iron sulfate 325 mg daily.  8. Altace 2.5 mg daily.  9. Adalat 60 mg daily.  10.Namenda 10 mg twice daily.  11.Aricept 10 mg at bedtime.  12.Zocor 40 mg at bedtime.  13.Travatan 0.004% eye drops 1 drop in each eye at bedtime.  14.Betoptic 0.25% eye drops 1 drop each eye twice  daily.   ALLERGIES:  NO KNOWN DRUG ALLERGIES.   SOCIAL HISTORY:  He is a nursing home resident.  Otherwise unable to  obtain because of the patient's status and this information is not  listed in the patient's medical records.   FAMILY HISTORY:  Unable to obtain.   REVIEW OF SYSTEMS:  Other than noted above, unable to obtain.   PHYSICAL EXAMINATION:  GENERAL:  This is a pleasant, elderly African  American gentleman, reclining in the office stretcher, in no acute  distress.  He is not oriented to place or time but he is oriented to  person.  He has no idea why he is here or where he is.  VITAL SIGNS:  His temperature is 97.  His pulse 90, respirations 20,  blood pressure 123/74.  He is saturating 99% room air.  HEENT:  His pupils are round and equal.  Mucous membranes pink.  Anicteric.  NECK:  No cervical lymphadenopathy or thyromegaly.  No jugular venous  distention.  No oral lesions.  No carotid bruit.  CHEST:  Clear to auscultation bilaterally.  CARDIOVASCULAR: Distant heart sounds but regular rhythm.  ABDOMEN:  Quite obese and distended by fat.  No tenderness.  No masses  felt.  EXTREMITIES:  Without edema, has 2+ dorsalis pedis pulses bilaterally  and they are equal.  CENTRAL NERVOUS SYSTEM:  At rest he has a slight left facial droop but  it disappears with smiling.  No cranial nerve deficits noted.  His power  is grade 5 throughout.  No focal neurologic signs noted.   LABORATORY DATA:  His white count is 10.2, hemoglobin 11.8, MCV 92.6,  platelets 194,000.  His sodium is 135, potassium 4.2, chloride 100, CO2  27, BUN 18, creatinine 1.31, calcium 8.7.  His glucose is 357.   ASSESSMENT:  1. Acute gastrointestinal bleeding, lower versus upper.  2. Diabetes type 2, uncontrolled.  3. Chronic renal insufficiency.  4. History of hypertension, controlled.  5. History of gout.  6. History of hyperlipidemia.   PLAN:  Will admit this gentleman for monitoring  serial hemoglobins  and  will request evaluation by gastrointestinal service with a view to  eliciting the source of his bleeding and applying the appropriate  remedy.  Will continue general medical support for the patient's other  medical problems.  Other plans as per orders.      Vania Rea, M.D.  Electronically Signed    LC/MEDQ  D:  08/02/2008  T:  08/03/2008  Job:  [id number]   cc:   [name] [name], M.D.  Fax: [phone]

## 2010-09-20 NOTE — Consult Note (Signed)
NAMEJARIS, [name]             ACCOUNT NO.:  [id number]   MEDICAL RECORD NO.:  [id number]          PATIENT TYPE:  INP   LOCATION:  1427                         FACILITY:  [facility]   PHYSICIAN:  Angelia Mould. Derrell Lolling, M.D.DATE OF BIRTH:  17-Nov-1940   DATE OF CONSULTATION:  08/04/2008  DATE OF DISCHARGE:                                 CONSULTATION   REASON FOR CONSULTATION:  Evaluate malignant-appearing sigmoid colon  mass.   HISTORY:  This is a 70 year old African American gentleman with a  history of disabling Alzheimer's dementia, insulin-dependent diabetes,  stroke, polycystic disease, hypertension, and some type of congenital  heart disease.  He lives at [facility] facility.  He  has been in a nursing facility of some type for 15 years since his  stroke. He was brought to the emergency room because of a finding of  maroon-colored stool and blood clots in his diapers.  Apparently there  was not any abdominal pain, nausea or vomiting.  He does take aspirin.   He was admitted on August 02, 2008, by the [facility] hospitalist team.  They noted no history of chest pain or shortness of breath or syncope.  He does apparently ambulate with a walker.  His code status is do not  resuscitate, but the nursing home and family indicated they wanted  everything done to evaluate the patient as to the cause of his bleeding.  He is stable at this time.   The patient was evaluated by Dr. Wendall Papa and underwent a colonoscopy  today.  The colonoscopy shows a bulky malignant-appearing mass in the  sigmoid colon starting at about 20 cm.  He also had some other colon  polyps which Dr. Christella Hartigan biopsied and feels are most likely benign.  The  patient is stable at this time post endoscopy and awake.   PAST MEDICAL HISTORY:  1. Alzheimer's dementia.  2. Hypertension.  3. Insulin-dependent diabetes.  4. Cerebrovascular disease with history of stroke.  5. History of congenital heart  disease, specifics unknown.  6. Depression.  7. Glaucoma.  8. Gout.  9. Hyperlipidemia.  10.History of polycystic kidney.   SURGERY:  I do not have any history of any surgical procedures in his  records.   CURRENT MEDICATIONS:  1. Lantus insulin 36 units in the morning and 66 units in the      afternoon.  2. NovoLog 5 units twice daily.  3. Zyloprim 100 mg every other day.  4. Aspirin 81 mg daily.  5. Potassium chloride 10 mEq daily.  6. Maxzide 25/325 mg daily.  7. Iron sulfate 325 mg daily.  8. Altace 2.5 mg daily.  9. Adalat 60 mg daily.  10.Namenda 10 mg twice daily.  11.Aricept 10 mg at bedtime.  12.Zocor 40 mg at bedtime.  13.Travatan 0.004% eye drops 1 drop each eye at bedtime.  14.Betoptic 0.25% eye drops in each eye 1 drop twice daily.   ALLERGIES:  NONE KNOWN.   SOCIAL HISTORY:  He is a nursing-home resident.  I do not have any  history of smoking or alcohol use.   FAMILY HISTORY:  Not able to obtain.  The patient has poor memory.   REVIEW OF SYSTEMS:  Other than noted above unable to obtain.   PHYSICAL EXAMINATION:  GENERAL:  A pleasant older African American  gentleman who is overweight, in bed and in no distress whatsoever.  MOST RECENT VITAL SIGNS:  Temperature 98.1, blood pressure 126/77, pulse  88, respiratory rate 20, oxygen saturation 96% on room air.  HEENT:  Eyes:  Sclerae are clear.  Extraocular movements intact.  Anicteric.  Ear, nose, mouth and throat, nose, lips, tongue and  oropharynx are without gross lesions.  NECK:  No adenopathy.  No thyromegaly.  No jugular venous distention.  No bruits.  LUNGS:  Clear to auscultation bilaterally.  No chest wall tenderness.  HEART:  Distant heart sounds, regular rhythm.  No ectopy.  ABDOMEN:  Obese, soft, nontender.  I do not see any scars.  I do not  feel any hernias.  I do not feel any masses.  Liver and spleen not  enlarged.  EXTREMITIES:  No edema.  Warm and dry.  NEUROLOGIC:  He has a slight left  facial droop which disappears with  smiling.  No cranial nerve defects.  No focal motor or sensory  abnormalities of the extremities.   ADMISSION DATA:  Hemoglobin 11.8, white blood cell count 10,200.  Sodium  135, creatinine 1.31, BUN 18, glucose 357.   ASSESSMENT:  1. Suspect locally advanced cancer of mid sigmoid colon.  He is at      risk for further bleeding and obstruction in the future.  2. Insulin-dependent diabetes mellitus, poorly controlled.  3. Chronic renal insufficiency.  4. History of polycystic kidney disease.  5. Alzheimer's dementia.  6. Hypertension.  7. History of cerebrovascular disease and stroke.  8. Gout.  9. Daughter states that the patient does not accept blood products.  10.Congestive heart failure   PLAN:  1. The patient will most likely require a sigmoid colectomy to      palliate bleeding and impending obstruction.  He is not symptomatic      at this moment, however.  He is obviously high risk for surgery.  2. I will have a conference with his family and power-of-attorney      sometime within the next 24 hours to outline      risks and indications for surgery.  3. He will go ahead with a CT scan of the abdomen and pelvis tomorrow      and will get a CEA at the same time.  4. We will need medical clearance for general anesthesia.      Angelia Mould. Derrell Lolling, M.D.  Electronically Signed     HMI/MEDQ  D:  08/04/2008  T:  08/04/2008  Job:  [id number]   cc:   [name]. [name], M.D.  Fax: [phone]   Rachael Fee, MD  [address]  [place], [place] [address]   Vania Rea, M.D.

## 2010-09-20 NOTE — Consult Note (Signed)
NAMEANJEL, [name] Gamble             ACCOUNT NO.:  [id number]   MEDICAL RECORD NO.:  [id number]          PATIENT TYPE:  INP   LOCATION:  1427                         FACILITY:  [facility]   PHYSICIAN:  Antonietta Breach, M.D.  DATE OF BIRTH:  1941/03/19   DATE OF CONSULTATION:  08/06/2008  DATE OF DISCHARGE:                                 CONSULTATION   REASON FOR CONSULTATION:  Mental status impairment, evaluate capacity  for informed consent.   HISTORY OF PRESENT ILLNESS:  [name] Gamble is a 70 year old male  admitted to the [facility] on March 28 with a gastrointestinal  bleed.   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  He will be entering surgery in order to palliate  bleeding and impending obstruction.   [name] Gamble has a pleasant mood.  He has impaired short-term recall.  He also has time impairment.  Please see the mental status exam.  He is  not agitated or combative.  There is no evidence of hallucinations or  delusions.  He is cooperative with bedside care.  He does have impaired  reasoning.  Please see the discussion below.   PAST PSYCHIATRIC HISTORY:  In review of the medical record, [name] does have dementia listed.  He has been treated with Aricept  10 mg daily and Namenda 10 mg b.i.d.   FAMILY PSYCHIATRIC HISTORY:  None known.   SOCIAL HISTORY:  [name] Gamble has a long-term history of intellectual  challenge.  He does have a healthcare power attorney.  He resides in a  nursing home.  He does not use alcohol or illegal drugs.  He is  medically disabled and unemployed.   PAST MEDICAL HISTORY:  Hypertension, diabetes, history of polycystic  kidney, hyperlipidemia, glaucoma, gout, history of congenital heart  disease, and cerebrovascular disease.  Please see the history of present  illness.   MEDICATIONS:  MAR is reviewed.  Psychotropics include Aricept 10 mg  q.h.s. and Namenda 10 mg  b.i.d.   He also has Ambien 5 mg q.h.s. p.r.n.   ALLERGIES:  NO KNOWN DRUG ALLERGIES.   LABORATORY DATA:  Sodium 140, BUN 3, creatinine 1.06, glucose 161.  WBC  9.1, hemoglobin 11.6, platelet count 366.  CEA was high at 35.5.  TSH  normal.   REVIEW OF SYSTEMS:  [name] Gamble cannot provide this.  The review of  systems is gleaned from the staff, the medical record, and the  electronic medical record.   REVIEW OF SYSTEMS:  Constitutional, head, eyes, ears, nose, throat,  mouth, neurologic, psychiatric, cardiovascular, respiratory,  gastrointestinal, genitourinary, skin, musculoskeletal,  hematologic/lymphatic, endocrine/metabolic all unremarkable.   EXAMINATION:  VITAL SIGNS:  Temperature 97.7, pulse 74, respiratory rate  20, blood pressure 131/84, O2 saturation on 2 L 100%.  GENERAL APPEARANCE:  [name] Gamble is an elderly male lying in a supine  position in his hospital bed with no abnormal involuntary movements.   MENTAL STATUS EXAM:  [name] Gamble is alert.  His eye contact is good.  His affect involves an occasional inappropriate smile.  His mood is  within normal limits.  His attention span is his normal, however, his  concentration is decreased and correlated with his impaired memory.  On  orientation testing, he does not know the year or the month.  He does  know that he is in a hospital in [place].  He is oriented to person.  Memory testing:  3/3 words immediate, 0/3 words on recall.  Also, he  will initially retain basic discussion of anatomy but then on recall  discussion, he is not able to react reiterate or recall the previous  discussion.  His fund of knowledge and intelligence are below average.  His speech involves slight dysarthria.  However, there is normal  prosody.  Thought process involves illogia.  He makes statements after  discussing a surgical procedure in very basic terms such as, the doctor  puts that end me and then I put that in him and smiles.   Thought  content:  No thoughts of harming himself or others.  No delusions or  hallucinations.  Insight is poor.  He does not have the ability to  appreciate his general medical condition nor his risk of morbidity and  mortality.  He cannot understand the potential risk of morbidity and  mortality that can arise with a surgical procedure.   His judgment is impaired.   ASSESSMENT:  AXIS I:  1. 294.9 unspecified persistent mental disorder NOS.  2. Dementia not otherwise specified.  AXIS II:  Deferred.  AXIS III:  See past medical history.  AXIS IV:  General medical.  AXIS V:  30.   [name] Gamble demonstrates critical impairments in memory, reasoning,  and the ability to appreciate risks of morbidity and mortality regarding  cancer, as well as surgery.   He does not have the capacity for informed consent.   Regarding his occasional inappropriate affect, this does not require  treatment and is assessed to be a manifestation of his level of mental  functioning due to his dementia and intellectually challenged condition.   Regarding his Aricept trial, would monitor for any loose stools or other  cholinergic adverse effects.      Antonietta Breach, M.D.  Electronically Signed     JW/MEDQ  D:  08/06/2008  T:  08/06/2008  Job:  [id number]

## 2010-09-20 NOTE — Discharge Summary (Signed)
[name], [name]             ACCOUNT NO.:  [id number]   MEDICAL RECORD NO.:  [id number]          PATIENT TYPE:  INP   LOCATION:  1336                         FACILITY:  [facility]   PHYSICIAN:  Theodosia Paling, MD    DATE OF BIRTH:  Sep 10, 1940   DATE OF ADMISSION:  08/02/2008  DATE OF DISCHARGE:                               DISCHARGE SUMMARY   Date of discharge is to be determined by the discharging physician.   PRIMARY CARE PHYSICIAN:  [name]. [name], M.D. at [facility].   ADMITTING HISTORY:  Please refer to the Dr. Blair Dolphin excellent  admission note under the history of present illness.   DISCHARGE DIAGNOSES:  1. Rectosigmoid mass.  2. Paralytic ileus.  3. Hypertension.  4. History of diabetes.  5. History of mental retardation and dementia.   DISCHARGE MEDICATIONS:  To be determined by the discharging physician.   CONSULTATIONS PERFORMED:  1. Angelia Mould. Derrell Lolling, M.D. dated August 04, 2008, for malignant      appearing sigmoid colon mass.  2. Consultation by Antonietta Breach, M.D. on August 06, 2008, for      capacity assessment.   PROCEDURE PERFORMED:  1. Exploratory laparotomy.  2. Sigmoid colectomy with primary anastomosis dated August 07, 2008.   IMAGING PERFORMED:  1. CT of the abdomen and pelvis with contrast showing sigmoid colon      mass, 7.1 x 22.7 cm with a single pathologically large mediastinal      lymph node.  2. Chest x-ray performed on August 03, 2008, showing no acute disease.  3. Chest x-ray done on August 07, 2008, showing placement of central      line.  4. On August 07, 2008, abdominal x-ray showing prececal ileus without      evidence of free intraperitoneal air.   HOSPITAL COURSE:  The following issues were addressed during the  hospitalization:  1. Rectal bleeding/rectal mass lesion.  The patient initially      presented with rectal bleeding.  The CT scan revealed a 7 cm rectal      mass.  Angelia Mould. Derrell Lolling, M.D. was consulted from  general surgery      who performed sigmoid colon resection and end-to-end anastomosis on      August 07, 2008.  The patient tolerated the procedure very well.      There was an issue regarding the blood transfusion, as the      patient's sister is a Scientist, product/process development.  However, because the      patient was incapacitated to make a decision, so according to the      sister, a blood transfusion if needed to be withheld.  However, the      patient did not have any postoperative anemia or any need of      hemoglobin so issue of blood transfusion did not arise.  The      patient tolerated the surgery very well.  2. Paralytic ileus.  Postoperatively the patient developed abdominal      distention and x-ray showed paralytic ileus.  Narcotics were  stopped.  The patient is receiving bowel rest and IV fluids at this      time.  TPN is getting started to enable to provide nutrition while      the patient is receiving bowel rest.  3. Diabetes.  The patient's glucose is currently uncontrolled.      However, the resumption of with the start of TPN the insulin will      need to be adjusted.  4. Hypertension.  The patient's blood pressure is currently fairly      controlled.  We will titrate medications accordingly.   DISPOSITION:  To be determined as the patient still has several acute  issues ongoing.      Theodosia Paling, MD  Electronically Signed     NP/MEDQ  D:  08/11/2008  T:  08/11/2008  Job:  [id number]

## 2010-09-20 NOTE — Consult Note (Signed)
[name], [name]             ACCOUNT NO.:  [id number]   MEDICAL RECORD NO.:  [id number]          PATIENT TYPE:  INP   LOCATION:  1336                         FACILITY:  [facility]   PHYSICIAN:  Firas N. Shadad        DATE OF BIRTH:  [birth year]   DATE OF CONSULTATION:  08/12/2008  DATE OF DISCHARGE:                                 CONSULTATION   REASON FOR CONSULTATION:  New diagnosis of colon cancer.   HISTORY OF PRESENT ILLNESS:  This 70 year old gentleman with history of  Alzheimer's dementia, diabetes as well as a few other medical conditions  including a question of mental retardation and diminished mental  capacity currently is currently residing at the [facility].  Patient presented on August 02, 2008, with blood in the stool and at that  time, he was felt to be mildly anemic with a hemoglobin of 11.8.  His  workup revealed a rectosigmoid mass.  He underwent a CT scan imaging of  chest, abdomen and pelvis which revealed as mentioned 7.1 x 3 cm mass in  the sigmoid colon with intraluminary filling and possibly calcified,  minimally enlarged lymph node.  Several hepatic lesions were noted, but  most likely fatty involvement.  There is also a 15 mm short-axis  diameter precarinal lymph node which at that time was suspicious for  metastasis, but for palliative purposes, patient underwent an operation  by Dr. Claud Kelp and underwent exploratory laparotomy, sigmoid  colectomy with primary anastomosis.  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  Of note, on  August 04, 2008, patient did develop a paralytic ileus postoperatively  and is currently on clears, also has TNA instituted.   REVIEW OF SYSTEMS:  Upon interviewing [name], he is relatively  asymptomatic, although he is  not able to give much history without any  abdominal pain, distention or any diarrhea.  Rest of review of systems  was unable to be obtained at this point.   PAST MEDICAL HISTORY:  Significant for Alzheimer's dementia,  hypertension, diabetes, CVA, history of congenital heart disease,  depression, glaucoma, gout, hyperlipidemia, also history of polycystic  kidney disease.   MEDICATIONS:  Include Lantus, NovoLog, __________, aspirin, potassium,  Maxzide, iron sulfate, Altace, Adalat, Namenda, Aricept, Zocor.   ALLERGIES:  None.   SOCIAL HISTORY:  Again, he lives in nursing home.  He ambulates with  some help, uses a walker, and is deemed to not have the capacity of  informed consent by Dr. Jeanie Sewer from psychiatry.   PHYSICAL EXAMINATION:  GENERAL:  Alert, awake gentleman appeared in no  distress.  VITAL SIGNS:  Blood pressure 137/94, pulse 97, respirations 22,  temperature 97.6.  He is saturating 95% on room air.  HEENT:  Head is normocephalic, atraumatic.  Pupils are equal, round and  reactive to light.  Oral mucosa is moist and pink.  NECK:  Supple, no lymphadenopathy.  HEART:  Regular rate and rhythm,  S1, S2.  LUNGS:  Clear.  ABDOMEN:  Soft.  EXTREMITIES:  Had no edema.   LABORATORY DATA:  Discussed and reviewed today.  This showed hemoglobin  of 10.4, white cell count of 9.2, platelet count of 402.  Chemistries  showed a potassium of 3.8, creatinine 0.97, AST of 18, ALT of 18, total  protein 6.5, albumin of 2.7, calcium of 8.3, bilirubin of 0.6, magnesium  1.8, phosphorus of 2.9.  As mentioned, he is currently on TNA.   IMPRESSION:  1. This is a 70 year old gentleman with a new diagnosis of at least T3      N1 colon cancer.  There are a couple of questionable findings on      his CT scan for staging purposes.  There is a slightly enlarged      precarinal mediastinal lymph node, but otherwise no clear cut      evidence of any metastasis.  His preoperative CEA was 35.5.   2. Postoperative paralytic ileus.  3. Dementia and diminished mental capacity, not a great candidate for      systemic chemotherapy.   RECOMMENDATIONS:  1. My recommendation will be at this point to continue supportive care      and postoperative recovery.  2. I will obtain a CEA with routine blood work to assess whether his      CEA has responded to his surgical operation and whether he has a      residually increased CEA.  In addition, I would like to obtain a      PET CT scan, will defer to that outpatient purposes for restaging      workup and followup at the [facility] for outpatient      purposes.  Again, discussion whether he needs chemotherapy would be      depending on his staging CT scans as well as discussion with his      family regarding risks and benefits.  Again, he is not a great      candidate for systemic chemotherapy because of his poor performance      status, multiple comorbid conditions as well as diminished capacity      and limited quality of life.  Nonetheless, we will have discussion      with patient and family as an outpatient purposes once he recovers      from this postoperative period.   Thank you for allowing me to participate in Mr. [name] care.           ______________________________  Blenda Nicely. [facility]  Electronically Signed     FNS/MEDQ  D:  08/12/2008  T:  08/12/2008  Job:  [id number]   cc:   [name]. [name], M.D.  Fax: [phone]

## 2010-09-20 NOTE — Op Note (Signed)
[name], [name]             ACCOUNT NO.:  [id number]   MEDICAL RECORD NO.:  [id number]          PATIENT TYPE:  INP   LOCATION:  1427                         FACILITY:  [facility]   PHYSICIAN:  Angelia Mould. 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   OPERATION PERFORMED:  Exploratory laparotomy, sigmoid colectomy, with  primary anastomosis.   SURGEON:  Angelia Mould. Derrell Lolling, M.D.   FIRST ASSISTANT:  Currie Paris, M.D.   OPERATIVE INDICATIONS:  This is a 70 year old African American gentleman  who has a 15-year history of stroke and dementia.  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  He ambulates with a walker but has  very poor recent and long-term memory and is incapable of the capacity  to understand his medical problems.  His family speaks for him.  He was  admitted with painless hematochezia.  Colonoscopy showed a bulky  malignant-appearing mass in the sigmoid colon starting at about 20 cm,  and biopsy showed adenocarcinoma.  CT scan shows a bulky tumor in the  sigmoid, and there is a question of some fatty density lesions in the  liver, but it is not clear whether this represents tumor or a benign  process.  There are just two or three of these.  CEA is elevated to 35.  I have counseled the patient as best as possible but primarily discussed  this with his family members.  They are in favor of him undergoing  sigmoid colon resection to palliate his bleeding and impending  obstruction.  He was able to tolerate a bowel prep and is brought to the  operating room electively.   OPERATIVE FINDINGS:  The patient had a bulky, softball-sized tumor in  the midsigmoid colon.  There were two or three and palpably  enlarged  lymph nodes in the mesentery, most of which were resected.  I did not  feel any mass elsewhere in the colon.  There was no ascites.  There were  no peritoneal implants that I could detect.  The right lobe and the left  lobe of the liver felt normal.  I could not feel any obvious malignant  masses anywhere.  He has gallstones.  There were no signs of  obstruction.   OPERATIVE TECHNIQUE:  Following the induction of general endotracheal  anesthesia, an orogastric tube was placed.  A Foley catheter had been  placed previously.  The abdomen was prepped and draped in a sterile  fashion.  Intravenous antibiotics were given.  The patient was  identified as the correct patient and correct procedure.  A lower  midline incision was made.  The fascia was incised in the midline and  the abdominal cavity entered and explored, with findings as described  above.  A self-retaining retractor was placed.   It was notable that the tumor was in the midsigmoid  colon, and the  sigmoid was quite mobile and had lots of redundancy, and fortunately I  did not have to mobilize the colon at all from the lateral peritoneal  attachments.   After thorough exploration, I decided to perform a resection and  anastomosis using a stapling technique.  I cleaned off the mesentery  about 4 cm proximal to the tumor and about 4 cm distal to the tumor, and  then transected the colon proximally and distally with a GIA-75 stapler.  I scored the mesentery with electrocautery.  I approached this case  mostly as a palliative procedure, but I still resected the mesentery to  get the  mesenteric lymph nodes.  The mesentery was divided using the  LigaSure device, and the specimen was removed, marking the proximal  margin with a silk suture.  I then oversewed the mesentery with a few  figure-of-eight sutures of 2-0 silk, and we had excellent hemostasis.  An anastomosis was created between the proximal and distal segments of   colon using a GIA stapling device.  The lumen of the colon was examined.  There was no active bleeding from the staple line anywhere.  There was a  little bit of dark old blood from previous bleeding.  The defect in the  bowel wall was closed with TA-60 stapling device.  There was a little  bit of bleeding from the TA-60 stapling line, which was oversewn with  figure-of-eight sutures of 3-0 silk.  We had excellent hemostasis at  this point.  We placed a few other sutures of 3-0 silk to reinforce the  staple line at critical points.  We closed the mesentery with multiple  interrupted sutures of 2-0 silk.   At this point, we changed our gloves and instruments.  We irrigated out  the abdomen and pelvis.  We inspected the anastomosis.  It looked  healthy.  There was good vascularity to both ends.  There was no sign of  any defect, and the mesentery was closed well.  The colon and small  bowel and omentum were returned to their anatomic positions.  The  midline fascia was closed with a running suture of #1 double-stranded  PDS, and the skin was closed with skin staples.  Clean bandages were  placed, and the patient was taken to the recovery room in stable  condition.   ESTIMATED BLOOD LOSS:  About 100 mL.   COMPLICATIONS:  None.   SPONGE, NEEDLE, AND INSTRUMENT COUNTS:  Correct.      Angelia Mould. Derrell Lolling, M.D.  Electronically Signed     HMI/MEDQ  D:  08/07/2008  T:  08/07/2008  Job:  [id number]   cc:   Rachael Fee, MD  [address]  [place], [place] [address]   [name]. [name], M.D.  Fax: [phone]

## 2010-09-22 ENCOUNTER — Other Ambulatory Visit: Payer: Self-pay | Admitting: Oncology

## 2010-09-22 ENCOUNTER — Encounter (HOSPITAL_BASED_OUTPATIENT_CLINIC_OR_DEPARTMENT_OTHER): Payer: Medicare Other | Admitting: Oncology

## 2010-09-22 DIAGNOSIS — C183 Malignant neoplasm of hepatic flexure: Secondary | ICD-10-CM

## 2010-09-22 DIAGNOSIS — C19 Malignant neoplasm of rectosigmoid junction: Secondary | ICD-10-CM

## 2010-09-22 LAB — CBC WITH DIFFERENTIAL/PLATELET
BASO%: 0.4 % (ref 0.0–2.0)
EOS%: 5.1 % (ref 0.0–7.0)
LYMPH%: 31.6 % (ref 14.0–49.0)
MCH: 31.7 pg (ref 27.2–33.4)
MCHC: 33.2 g/dL (ref 32.0–36.0)
MONO#: 0.7 10*3/uL (ref 0.1–0.9)
Platelets: 255 10*3/uL (ref 140–400)
RBC: 4.31 10*6/uL (ref 4.20–5.82)
WBC: 8.1 10*3/uL (ref 4.0–10.3)
lymph#: 2.6 10*3/uL (ref 0.9–3.3)

## 2010-09-22 LAB — COMPREHENSIVE METABOLIC PANEL
ALT: 22 U/L (ref 0–53)
AST: 25 U/L (ref 0–37)
CO2: 25 mEq/L (ref 19–32)
Creatinine, Ser: 1.16 mg/dL (ref 0.40–1.50)
Sodium: 139 mEq/L (ref 135–145)
Total Bilirubin: 0.3 mg/dL (ref 0.3–1.2)
Total Protein: 7.6 g/dL (ref 6.0–8.3)

## 2011-02-15 ENCOUNTER — Encounter: Payer: Self-pay | Admitting: *Deleted

## 2011-03-25 ENCOUNTER — Telehealth: Payer: Self-pay | Admitting: Oncology

## 2011-03-25 NOTE — Telephone Encounter (Signed)
S/w pt's sister Delray Alt, advised pt's new appt is 05/24/11 @ 10am. Delray Alt says she will inform the nursing home where pt lives and if they need to r/s the nursing home will call us.

## 2011-05-23 ENCOUNTER — Encounter: Payer: Self-pay | Admitting: *Deleted

## 2011-05-24 ENCOUNTER — Ambulatory Visit: Payer: Medicare Other | Admitting: Oncology

## 2011-05-24 ENCOUNTER — Other Ambulatory Visit: Payer: Medicare Other | Admitting: Lab

## 2011-07-28 ENCOUNTER — Inpatient Hospital Stay (HOSPITAL_COMMUNITY)
Admission: EM | Admit: 2011-07-28 | Discharge: 2011-08-07 | DRG: 640 | Disposition: A | Discharge status: Skilled Nursing Facility | Payer: Medicare Other | Attending: Internal Medicine | Admitting: Internal Medicine

## 2011-07-28 ENCOUNTER — Emergency Department (HOSPITAL_COMMUNITY): Payer: Medicare Other

## 2011-07-28 ENCOUNTER — Encounter (HOSPITAL_COMMUNITY): Payer: Self-pay | Admitting: *Deleted

## 2011-07-28 ENCOUNTER — Other Ambulatory Visit: Payer: Self-pay

## 2011-07-28 DIAGNOSIS — G9340 Encephalopathy, unspecified: Secondary | ICD-10-CM | POA: Diagnosis present

## 2011-07-28 DIAGNOSIS — R1310 Dysphagia, unspecified: Secondary | ICD-10-CM | POA: Diagnosis present

## 2011-07-28 DIAGNOSIS — J690 Pneumonitis due to inhalation of food and vomit: Secondary | ICD-10-CM

## 2011-07-28 DIAGNOSIS — Z66 Do not resuscitate: Secondary | ICD-10-CM | POA: Diagnosis present

## 2011-07-28 DIAGNOSIS — N182 Chronic kidney disease, stage 2 (mild): Secondary | ICD-10-CM

## 2011-07-28 DIAGNOSIS — I129 Hypertensive chronic kidney disease with stage 1 through stage 4 chronic kidney disease, or unspecified chronic kidney disease: Secondary | ICD-10-CM | POA: Diagnosis present

## 2011-07-28 DIAGNOSIS — E870 Hyperosmolality and hypernatremia: Principal | ICD-10-CM

## 2011-07-28 DIAGNOSIS — N179 Acute kidney failure, unspecified: Secondary | ICD-10-CM

## 2011-07-28 DIAGNOSIS — N289 Disorder of kidney and ureter, unspecified: Secondary | ICD-10-CM

## 2011-07-28 DIAGNOSIS — E119 Type 2 diabetes mellitus without complications: Secondary | ICD-10-CM | POA: Diagnosis present

## 2011-07-28 DIAGNOSIS — R4182 Altered mental status, unspecified: Secondary | ICD-10-CM

## 2011-07-28 DIAGNOSIS — F0390 Unspecified dementia without behavioral disturbance: Secondary | ICD-10-CM | POA: Diagnosis present

## 2011-07-28 LAB — DIFFERENTIAL
Basophils Absolute: 0 10*3/uL (ref 0.0–0.1)
Eosinophils Absolute: 0.1 10*3/uL (ref 0.0–0.7)
Eosinophils Relative: 2 % (ref 0–5)
Lymphocytes Relative: 33 % (ref 12–46)
Monocytes Absolute: 0.6 10*3/uL (ref 0.1–1.0)

## 2011-07-28 LAB — POCT I-STAT, CHEM 8
BUN: 40 mg/dL — ABNORMAL HIGH (ref 6–23)
Hemoglobin: 16.7 g/dL (ref 13.0–17.0)
Sodium: 162 mEq/L (ref 135–145)
TCO2: 27 mmol/L (ref 0–100)

## 2011-07-28 LAB — COMPREHENSIVE METABOLIC PANEL
ALT: 24 U/L (ref 0–53)
AST: 41 U/L — ABNORMAL HIGH (ref 0–37)
Albumin: 2.9 g/dL — ABNORMAL LOW (ref 3.5–5.2)
Alkaline Phosphatase: 106 U/L (ref 39–117)
Potassium: 4.5 mEq/L (ref 3.5–5.1)
Sodium: 156 mEq/L — ABNORMAL HIGH (ref 135–145)
Total Protein: 9.1 g/dL — ABNORMAL HIGH (ref 6.0–8.3)

## 2011-07-28 LAB — CBC
HCT: 50.4 % (ref 39.0–52.0)
MCH: 32 pg (ref 26.0–34.0)
MCHC: 32.1 g/dL (ref 30.0–36.0)
MCV: 99.6 fL (ref 78.0–100.0)
RDW: 14.6 % (ref 11.5–15.5)
WBC: 8 10*3/uL (ref 4.0–10.5)

## 2011-07-28 LAB — LACTIC ACID, PLASMA: Lactic Acid, Venous: 2.2 mmol/L (ref 0.5–2.2)

## 2011-07-28 LAB — TROPONIN I: Troponin I: <0.3 ng/mL (ref <0.30)

## 2011-07-28 MED ORDER — VANCOMYCIN HCL IN DEXTROSE 1-5 GM/200ML-% IV SOLN
1000.0000 mg | Freq: Once | INTRAVENOUS | Status: AC
  Administered 2011-07-28: 1000 mg via INTRAVENOUS
  Filled 2011-07-28: qty 200

## 2011-07-28 MED ORDER — SODIUM CHLORIDE 0.9 % IV BOLUS (SEPSIS)
2000.0000 mL | Freq: Once | INTRAVENOUS | Status: AC
  Administered 2011-07-28: 2000 mL via INTRAVENOUS

## 2011-07-28 MED ORDER — METHYLPREDNISOLONE SODIUM SUCC 125 MG IJ SOLR
125.0000 mg | Freq: Once | INTRAMUSCULAR | Status: AC
  Administered 2011-07-28: 125 mg via INTRAVENOUS
  Filled 2011-07-28: qty 2

## 2011-07-28 MED ORDER — SODIUM CHLORIDE 0.9 % IV BOLUS (SEPSIS)
1000.0000 mL | Freq: Once | INTRAVENOUS | Status: AC
  Administered 2011-07-28: 1000 mL via INTRAVENOUS

## 2011-07-28 MED ORDER — SODIUM CHLORIDE 0.9 % IV SOLN
INTRAVENOUS | Status: DC
  Administered 2011-07-28: 23:00:00 via INTRAVENOUS

## 2011-07-28 MED ORDER — PIPERACILLIN-TAZOBACTAM 3.375 G IVPB 30 MIN
3.3750 g | Freq: Once | INTRAVENOUS | Status: AC
  Administered 2011-07-28: 3.375 g via INTRAVENOUS
  Filled 2011-07-28: qty 50

## 2011-07-28 MED ORDER — IPRATROPIUM BROMIDE 0.02 % IN SOLN
0.5000 mg | Freq: Once | RESPIRATORY_TRACT | Status: AC
  Administered 2011-07-28: 0.5 mg via RESPIRATORY_TRACT
  Filled 2011-07-28: qty 2.5

## 2011-07-28 MED ORDER — ALBUTEROL SULFATE (5 MG/ML) 0.5% IN NEBU
5.0000 mg | INHALATION_SOLUTION | Freq: Once | RESPIRATORY_TRACT | Status: AC
  Administered 2011-07-28: 5 mg via RESPIRATORY_TRACT
  Filled 2011-07-28: qty 0.5

## 2011-07-28 NOTE — ED Provider Notes (Signed)
History     CSN: [id number]  Arrival date & time 07/28/11  2020   First MD Initiated Contact with Patient 07/28/11 2140      Chief Complaint  Patient presents with  . Altered Mental Status    (Consider location/radiation/quality/duration/timing/severity/associated sxs/prior treatment) HPI This 71 year old male is a nursing home patient with risk for aspiration pneumonia according to family who provides the history. The patient is nonverbal and cannot provide a history. The family states the patient has dementia and is usually pleasantly confused and able to carry on a conversation walk. The family states the last 2-3 weeks the patient has not been able to walk and over the last week has had fever apparently had a daily basis with coughing and food stuck in his mouth with suspected aspiration. He has now become essentially nonverbal and able to say his name but not follow simple commands or talk anymore or sit up or walk anymore. He has been coughing, possibly short of breath, more confused than usual and less responsive than usual and unable to walk for the last several days of fever apparently for several days. He has had what appears to be thrush with a white coated tongue for several days now. The family states the patient is a DO NOT RESUSCITATE, DO NOT INTUBATE, and no blood transfusions patient. Past Medical History  Diagnosis Date  . Colon cancer   . Dementia   . CHF (congestive heart failure)   . Hypertension   . Hyperlipemia   . Diabetes mellitus   . History of CVA (cerebrovascular accident)   . History of colectomy   . Mild mental retardation     Past Surgical History  Procedure Date  . Colectomy 08/2008    History reviewed. No pertinent family history.  History  Substance Use Topics  . Smoking status: Not on file  . Smokeless tobacco: Not on file  . Alcohol Use: No      Review of Systems  Unable to perform ROS: Mental status change    Allergies  Review of  patient's allergies indicates no known allergies.  Home Medications   No current outpatient prescriptions on file.  BP 174/95  Pulse 87  Temp(Src) 97.7 F (36.5 C) (Oral)  Resp 18  Ht 6' (1.829 m)  Wt 214 lb 8.1 oz (97.3 kg)  BMI 29.09 kg/m2  SpO2 92%  Physical Exam  Nursing note and vitals reviewed. Constitutional:       Awake, and will open eyes and attempt to give a one-word answer occasionally to questions which is essentially an incomprehensible word and does not follow simple commands  HENT:  Head: Atraumatic.  Eyes: Right eye exhibits no discharge. Left eye exhibits no discharge.  Neck: Neck supple.  Cardiovascular: Normal rate and regular rhythm.   No murmur heard. Pulmonary/Chest: Effort normal. No respiratory distress. He has wheezes. He has rales. He exhibits no tenderness.       Scattered mild diffuse wheezes or rhonchi and crackles at the bases  Abdominal: Soft. There is no tenderness. There is no rebound.  Musculoskeletal: He exhibits no tenderness.       Baseline ROM, no obvious new focal weakness.  Neurological:       Mental status and motor strength appears generally weaker and less responsive than usual and not following simple commands  Skin: No rash noted.  Psychiatric: He has a normal mood and affect.    ED Course  Procedures (including critical care time) ECG:  Sugar 85, normal axis, left ventricular hypertrophy, no significant change noted compared with March 2010 Labs Reviewed  URINALYSIS, ROUTINE W REFLEX MICROSCOPIC - Abnormal; Notable for the following:    APPearance CLOUDY (*)    Specific Gravity, Urine 1.032 (*)    Hgb urine dipstick MODERATE (*)    Protein, ur >300 (*)    All other components within normal limits  POCT I-STAT, CHEM 8 - Abnormal; Notable for the following:    Sodium 162 (*)    Chloride 123 (*)    BUN 40 (*)    Creatinine, Ser 1.70 (*)    Glucose, Bld 148 (*)    Calcium, Ion 1.08 (*)    All other components within  normal limits  COMPREHENSIVE METABOLIC PANEL - Abnormal; Notable for the following:    Sodium 156 (*)    Chloride 117 (*)    Glucose, Bld 141 (*)    BUN 37 (*)    Creatinine, Ser 1.73 (*)    Total Protein 9.1 (*)    Albumin 2.9 (*)    AST 41 (*) NO VISIBLE HEMOLYSIS   GFR calc non Af Amer 38 (*)    GFR calc Af Amer 44 (*)    All other components within normal limits  URINE MICROSCOPIC-ADD ON - Abnormal; Notable for the following:    Casts GRANULAR CAST (*)    All other components within normal limits  CBC - Abnormal; Notable for the following:    RBC 4.13 (*)    Hemoglobin 12.8 (*)    MCV 100.7 (*)    All other components within normal limits  COMPREHENSIVE METABOLIC PANEL - Abnormal; Notable for the following:    Sodium 156 (*)    Chloride 121 (*)    Glucose, Bld 165 (*)    BUN 35 (*)    Creatinine, Ser 1.67 (*)    Calcium 7.6 (*)    Albumin 2.5 (*)    GFR calc non Af Amer 40 (*)    GFR calc Af Amer 46 (*)    All other components within normal limits  DIFFERENTIAL - Abnormal; Notable for the following:    Neutrophils Relative 79 (*)    Monocytes Relative 2 (*)    All other components within normal limits  APTT - Abnormal; Notable for the following:    aPTT 40 (*)    All other components within normal limits  PROTIME-INR - Abnormal; Notable for the following:    Prothrombin Time 16.6 (*)    All other components within normal limits  PRO B NATRIURETIC PEPTIDE - Abnormal; Notable for the following:    Pro B Natriuretic peptide (BNP) 396.6 (*)    All other components within normal limits  CARDIAC PANEL(CRET KIN+CKTOT+MB+TROPI) - Abnormal; Notable for the following:    Total CK 514 (*)    All other components within normal limits  CARDIAC PANEL(CRET KIN+CKTOT+MB+TROPI) - Abnormal; Notable for the following:    Total CK 587 (*)    All other components within normal limits  HEMOGLOBIN A1C - Abnormal; Notable for the following:    Hemoglobin A1C 6.3 (*)    Mean Plasma  Glucose 134 (*)    All other components within normal limits  COMPREHENSIVE METABOLIC PANEL - Abnormal; Notable for the following:    Sodium 155 (*)    Chloride 121 (*)    Glucose, Bld 191 (*)    BUN 36 (*)    Creatinine, Ser 1.60 (*)  Calcium 7.8 (*)    Albumin 2.6 (*)    GFR calc non Af Amer 42 (*)    GFR calc Af Amer 48 (*)    All other components within normal limits  CBC - Abnormal; Notable for the following:    RBC 4.16 (*)    All other components within normal limits  GLUCOSE, CAPILLARY - Abnormal; Notable for the following:    Glucose-Capillary 169 (*)    All other components within normal limits  GLUCOSE, CAPILLARY - Abnormal; Notable for the following:    Glucose-Capillary 195 (*)    All other components within normal limits  GLUCOSE, CAPILLARY - Abnormal; Notable for the following:    Glucose-Capillary 166 (*)    All other components within normal limits  CBC  DIFFERENTIAL  LACTIC ACID, PLASMA  PROCALCITONIN  TROPONIN I  MAGNESIUM  PHOSPHORUS  TSH  MRSA PCR SCREENING  URINE CULTURE  CULTURE, BLOOD (ROUTINE X 2)  CULTURE, BLOOD (ROUTINE X 2)  CARDIAC PANEL(CRET KIN+CKTOT+MB+TROPI)  OSMOLALITY, URINE  SODIUM, URINE, RANDOM  CREATININE, URINE, 24 HOUR  BASIC METABOLIC PANEL  BASIC METABOLIC PANEL   Dg Chest 1 View  07/28/2011  *RADIOLOGY REPORT*  Clinical Data: Fever, altered mental status, possible fall, history carcinoma of the colon, dementia, hypertension, diabetes, CHF, stroke  CHEST - 1 VIEW  Comparison: 08/15/2008  Findings: Minimal enlargement of cardiac silhouette. Tortuous aorta. Prior vascular congestion. Minimal right basilar atelectasis. Slight accentuation of perihilar interstitial markings since previous exam could reflect minimal failure. No segmental consolidation or pleural effusion. No pneumothorax. Bones unremarkable.  IMPRESSION: Enlargement of cardiac silhouette with pulmonary vascular congestion. Minimal right basilar atelectasis. Cannot  exclude minimal failure.  Original Report Authenticated By: Lollie Marrow, M.D.   Dg Pelvis 1-2 Views  07/28/2011  *RADIOLOGY REPORT*  Clinical Data: Altered mental status  PELVIS - 1-2 VIEW  Comparison: None  Findings: Osseous demineralization. Minimal narrowing of hip joints bilaterally. SI joints symmetric preserved. No acute fracture, dislocation, or bone destruction. Scattered atherosclerotic calcifications of numerous pelvic phleboliths. Mild gaseous distention of colon through the rectum with a stool ball in rectum.  IMPRESSION: Osseous demineralization with minimal degenerative changes of the hip joints. No acute bony abnormalities.  Original Report Authenticated By: Lollie Marrow, M.D.   Dg Shoulder Right  07/28/2011  *RADIOLOGY REPORT*  Clinical Data: Fever, possible fall, altered mental status  RIGHT SHOULDER - 2+ VIEW  Comparison: None  Findings: Osseous demineralization. AC joint alignment normal. No acute fracture, dislocation, or bone destruction. Visualized right ribs intact.  IMPRESSION: No acute bony abnormalities.  Original Report Authenticated By: Lollie Marrow, M.D.   Ct Head Wo Contrast  07/28/2011  *RADIOLOGY REPORT*  Clinical Data:  Multiple falls.  Head and neck injury.  Altered mental status. Colon cancer.  CT HEAD WITHOUT CONTRAST CT CERVICAL SPINE WITHOUT CONTRAST  Technique:  Multidetector CT imaging of the head and cervical spine was performed following the standard protocol without intravenous contrast.  Multiplanar CT image reconstructions of the cervical spine were also generated.  Comparison:  Head CT on 11/19/2008  CT HEAD  Findings: There is no evidence of intracranial hemorrhage, brain edema or other signs of acute infarction.  There is no evidence of intracranial mass lesion or mass effect.  No abnormal extra-axial fluid collections are identified.  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  Multiple lacunar infarcts and  chronic small vessel disease are again demonstrated  as well as an old left cerebellar infarct.  Moderate cerebral atrophy is also stable. Heavy intracranial atherosclerotic calcification is noted.  No evidence of skull fracture or other bone abnormality.  IMPRESSION:  1.  No acute intracranial abnormality. 2.  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  CT CERVICAL SPINE  Findings: No evidence of cervical spine fracture or subluxation. Severe degenerative disc disease is seen at C4-5 and C6-7.  Mild facet DJD is seen bilaterally.  Atlantoaxial degenerative changes also noted.  IMPRESSION:  1.  No evidence of cervical spine fracture or subluxation. 2.  Degenerative cervical spondylosis.  Original Report Authenticated By: Danae Orleans, M.D.   Ct Cervical Spine Wo Contrast  07/28/2011  *RADIOLOGY REPORT*  Clinical Data:  Multiple falls.  Head and neck injury.  Altered mental status. Colon cancer.  CT HEAD WITHOUT CONTRAST CT CERVICAL SPINE WITHOUT CONTRAST  Technique:  Multidetector CT imaging of the head and cervical spine was performed following the standard protocol without intravenous contrast.  Multiplanar CT image reconstructions of the cervical spine were also generated.  Comparison:  Head CT on 11/19/2008  CT HEAD  Findings: There is no evidence of intracranial hemorrhage, brain edema or other signs of acute infarction.  There is no evidence of intracranial mass lesion or mass effect.  No abnormal extra-axial fluid collections are identified.  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  Multiple lacunar infarcts and chronic small vessel disease are again demonstrated as well as an old left cerebellar infarct.  Moderate cerebral atrophy is also stable. Heavy intracranial atherosclerotic calcification is noted.  No evidence of skull fracture or other bone abnormality.  IMPRESSION:   1.  No acute intracranial abnormality. 2.  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  CT CERVICAL SPINE  Findings: No evidence of cervical spine fracture or subluxation. Severe degenerative disc disease is seen at C4-5 and C6-7.  Mild facet DJD is seen bilaterally.  Atlantoaxial degenerative changes also noted.  IMPRESSION:  1.  No evidence of cervical spine fracture or subluxation. 2.  Degenerative cervical spondylosis.  Original Report Authenticated By: Danae Orleans, M.D.     1. Altered mental status   2. Hypernatremia   3. Aspiration pneumonia   4. Renal insufficiency       MDM  The patient appears reasonably stabilized for admission considering the current resources, flow, and capabilities available in the ED at this time, and I doubt any other [facility] requiring further screening and/or treatment in the ED prior to admission.        Hurman Horn, MD 07/29/11 [phone]

## 2011-07-28 NOTE — ED Notes (Signed)
Per EMS: pt coming from [facility] with c/o of ALOC. Pt c/o of right shoulder pain. Nursing staff reported thrush. Pt is hot to the touch. ems reports rhonchi in upper fields. Pt has a hx of UTI and dementia. Pt is lethargic.

## 2011-07-28 NOTE — ED Notes (Signed)
ZOX:[id number]<AV> Expected date:<BR> Expected time: 8:19 PM<BR> Means of arrival:Ambulance<BR> Comments:<BR> M140 -- Decreased LOC

## 2011-07-28 NOTE — ED Notes (Signed)
Completed In/Out Cath but had no urine return. RN Minerva Areola notified.

## 2011-07-29 ENCOUNTER — Encounter (HOSPITAL_COMMUNITY): Payer: Self-pay | Admitting: *Deleted

## 2011-07-29 DIAGNOSIS — N179 Acute kidney failure, unspecified: Secondary | ICD-10-CM | POA: Diagnosis present

## 2011-07-29 DIAGNOSIS — J690 Pneumonitis due to inhalation of food and vomit: Secondary | ICD-10-CM | POA: Diagnosis present

## 2011-07-29 DIAGNOSIS — R4182 Altered mental status, unspecified: Secondary | ICD-10-CM | POA: Diagnosis present

## 2011-07-29 DIAGNOSIS — E870 Hyperosmolality and hypernatremia: Secondary | ICD-10-CM | POA: Diagnosis present

## 2011-07-29 LAB — COMPREHENSIVE METABOLIC PANEL
ALT: 26 U/L (ref 0–53)
AST: 29 U/L (ref 0–37)
Albumin: 2.5 g/dL — ABNORMAL LOW (ref 3.5–5.2)
Alkaline Phosphatase: 93 U/L (ref 39–117)
BUN: 35 mg/dL — ABNORMAL HIGH (ref 6–23)
CO2: 25 mEq/L (ref 19–32)
Calcium: 7.6 mg/dL — ABNORMAL LOW (ref 8.4–10.5)
Calcium: 7.8 mg/dL — ABNORMAL LOW (ref 8.4–10.5)
GFR calc Af Amer: 46 mL/min — ABNORMAL LOW (ref >90)
GFR calc non Af Amer: 42 mL/min — ABNORMAL LOW (ref >90)
Glucose, Bld: 165 mg/dL — ABNORMAL HIGH (ref 70–99)
Potassium: 3.8 mEq/L (ref 3.5–5.1)
Potassium: 3.9 mEq/L (ref 3.5–5.1)
Sodium: 156 mEq/L — ABNORMAL HIGH (ref 135–145)
Sodium: 155 mEq/L — ABNORMAL HIGH (ref 135–145)
Total Protein: 8 g/dL (ref 6.0–8.3)

## 2011-07-29 LAB — CBC
HCT: 41.6 % (ref 39.0–52.0)
HCT: 41.6 % (ref 39.0–52.0)
Hemoglobin: 12.8 g/dL — ABNORMAL LOW (ref 13.0–17.0)
Hemoglobin: 13.1 g/dL (ref 13.0–17.0)
MCH: 31 pg (ref 26.0–34.0)
MCH: 31.5 pg (ref 26.0–34.0)
MCHC: 30.8 g/dL (ref 30.0–36.0)
MCHC: 31.5 g/dL (ref 30.0–36.0)
RDW: 14.6 % (ref 11.5–15.5)
RDW: 14.4 % (ref 11.5–15.5)

## 2011-07-29 LAB — BASIC METABOLIC PANEL
BUN: 34 mg/dL — ABNORMAL HIGH (ref 6–23)
BUN: 39 mg/dL — ABNORMAL HIGH (ref 6–23)
Calcium: 7.9 mg/dL — ABNORMAL LOW (ref 8.4–10.5)
Calcium: 7.8 mg/dL — ABNORMAL LOW (ref 8.4–10.5)
Creatinine, Ser: 1.75 mg/dL — ABNORMAL HIGH (ref 0.50–1.35)
GFR calc Af Amer: 43 mL/min — ABNORMAL LOW (ref >90)
GFR calc non Af Amer: 44 mL/min — ABNORMAL LOW (ref >90)
GFR calc non Af Amer: 37 mL/min — ABNORMAL LOW (ref >90)
Glucose, Bld: 196 mg/dL — ABNORMAL HIGH (ref 70–99)

## 2011-07-29 LAB — DIFFERENTIAL
Basophils Absolute: 0 10*3/uL (ref 0.0–0.1)
Eosinophils Absolute: 0.1 10*3/uL (ref 0.0–0.7)
Lymphocytes Relative: 18 % (ref 12–46)
Monocytes Relative: 2 % — ABNORMAL LOW (ref 3–12)
Neutrophils Relative %: 79 % — ABNORMAL HIGH (ref 43–77)

## 2011-07-29 LAB — GLUCOSE, CAPILLARY
Glucose-Capillary: 169 mg/dL — ABNORMAL HIGH (ref 70–99)
Glucose-Capillary: 195 mg/dL — ABNORMAL HIGH (ref 70–99)
Glucose-Capillary: 251 mg/dL — ABNORMAL HIGH (ref 70–99)

## 2011-07-29 LAB — MAGNESIUM: Magnesium: 2.1 mg/dL (ref 1.5–2.5)

## 2011-07-29 LAB — CARDIAC PANEL(CRET KIN+CKTOT+MB+TROPI)
CK, MB: 2.8 ng/mL (ref 0.3–4.0)
Relative Index: 0.4 (ref 0.0–2.5)
Relative Index: 0.5 (ref 0.0–2.5)
Total CK: 587 U/L — ABNORMAL HIGH (ref 7–232)
Troponin I: <0.3 ng/mL (ref <0.30)
Troponin I: <0.3 ng/mL (ref <0.30)
Troponin I: <0.3 ng/mL (ref <0.30)

## 2011-07-29 LAB — PROTIME-INR: Prothrombin Time: 16.6 seconds — ABNORMAL HIGH (ref 11.6–15.2)

## 2011-07-29 LAB — PRO B NATRIURETIC PEPTIDE: Pro B Natriuretic peptide (BNP): 396.6 pg/mL — ABNORMAL HIGH (ref 0–125)

## 2011-07-29 LAB — URINALYSIS, ROUTINE W REFLEX MICROSCOPIC
Bilirubin Urine: NEGATIVE
Ketones, ur: NEGATIVE mg/dL
Nitrite: NEGATIVE
Protein, ur: >300 mg/dL — AB
pH: 5.5 (ref 5.0–8.0)

## 2011-07-29 LAB — OSMOLALITY, URINE: Osmolality, Ur: 647 mOsm/kg (ref 390–1090)

## 2011-07-29 LAB — HEMOGLOBIN A1C: Mean Plasma Glucose: 134 mg/dL — ABNORMAL HIGH (ref <117)

## 2011-07-29 LAB — APTT: aPTT: 40 seconds — ABNORMAL HIGH (ref 24–37)

## 2011-07-29 LAB — URINE MICROSCOPIC-ADD ON

## 2011-07-29 MED ORDER — METHYLPREDNISOLONE SODIUM SUCC 125 MG IJ SOLR
60.0000 mg | Freq: Two times a day (BID) | INTRAMUSCULAR | Status: DC
  Administered 2011-07-29 – 2011-07-30 (×4): 60 mg via INTRAVENOUS
  Filled 2011-07-29 (×7): qty 0.96

## 2011-07-29 MED ORDER — FLUOXETINE HCL 20 MG PO CAPS
20.0000 mg | ORAL_CAPSULE | Freq: Every day | ORAL | Status: DC
  Administered 2011-07-29 – 2011-08-07 (×10): 20 mg via ORAL
  Filled 2011-07-29 (×11): qty 1

## 2011-07-29 MED ORDER — BETAXOLOL HCL 0.25 % OP SUSP
1.0000 [drp] | Freq: Two times a day (BID) | OPHTHALMIC | Status: DC
  Administered 2011-07-29 – 2011-08-07 (×19): 1 [drp] via OPHTHALMIC
  Filled 2011-07-29: qty 10

## 2011-07-29 MED ORDER — TOLTERODINE TARTRATE ER 4 MG PO CP24
4.0000 mg | ORAL_CAPSULE | Freq: Every day | ORAL | Status: DC
  Administered 2011-07-29 – 2011-08-07 (×10): 4 mg via ORAL
  Filled 2011-07-29 (×11): qty 1

## 2011-07-29 MED ORDER — CLONIDINE HCL 0.2 MG PO TABS
0.2000 mg | ORAL_TABLET | Freq: Three times a day (TID) | ORAL | Status: DC
  Administered 2011-07-29 – 2011-08-07 (×27): 0.2 mg via ORAL
  Filled 2011-07-29 (×33): qty 1

## 2011-07-29 MED ORDER — ENOXAPARIN SODIUM 30 MG/0.3ML ~~LOC~~ SOLN
30.0000 mg | SUBCUTANEOUS | Status: DC
  Administered 2011-07-29: 30 mg via SUBCUTANEOUS
  Filled 2011-07-29: qty 0.3

## 2011-07-29 MED ORDER — AMLODIPINE BESYLATE 10 MG PO TABS
10.0000 mg | ORAL_TABLET | Freq: Every evening | ORAL | Status: DC
  Administered 2011-07-29 – 2011-08-06 (×8): 10 mg via ORAL
  Filled 2011-07-29 (×11): qty 1

## 2011-07-29 MED ORDER — ATORVASTATIN CALCIUM 10 MG PO TABS
20.0000 mg | ORAL_TABLET | Freq: Every day | ORAL | Status: DC
  Administered 2011-07-29 – 2011-08-06 (×9): 20 mg via ORAL
  Filled 2011-07-29 (×11): qty 2
  Filled 2011-07-29: qty 1

## 2011-07-29 MED ORDER — SIMVASTATIN 40 MG PO TABS
40.0000 mg | ORAL_TABLET | Freq: Every day | ORAL | Status: DC
  Filled 2011-07-29: qty 1

## 2011-07-29 MED ORDER — INSULIN ASPART 100 UNIT/ML ~~LOC~~ SOLN
0.0000 [IU] | Freq: Every day | SUBCUTANEOUS | Status: DC
  Administered 2011-07-29: 4 [IU] via SUBCUTANEOUS
  Administered 2011-07-30 – 2011-07-31 (×2): 5 [IU] via SUBCUTANEOUS
  Administered 2011-08-01: 3 [IU] via SUBCUTANEOUS
  Administered 2011-08-02 – 2011-08-06 (×5): 4 [IU] via SUBCUTANEOUS

## 2011-07-29 MED ORDER — ACETAMINOPHEN 325 MG PO TABS: 650.0000 mg | ORAL_TABLET | ORAL | Status: DC | PRN

## 2011-07-29 MED ORDER — FOOD THICKENER (THICKENUP CLEAR)
ORAL | Status: DC | PRN
  Filled 2011-07-29: qty 120

## 2011-07-29 MED ORDER — CARBAMAZEPINE 100 MG PO CHEW
100.0000 mg | CHEWABLE_TABLET | Freq: Every day | ORAL | Status: DC
  Administered 2011-07-29 – 2011-08-07 (×10): 100 mg via ORAL
  Filled 2011-07-29 (×11): qty 1

## 2011-07-29 MED ORDER — CARBAMAZEPINE 100 MG PO CHEW
200.0000 mg | CHEWABLE_TABLET | Freq: Every day | ORAL | Status: DC
  Administered 2011-07-29 – 2011-08-06 (×9): 200 mg via ORAL
  Filled 2011-07-29 (×12): qty 2

## 2011-07-29 MED ORDER — VANCOMYCIN HCL IN DEXTROSE 1-5 GM/200ML-% IV SOLN
1000.0000 mg | Freq: Once | INTRAVENOUS | Status: DC
  Filled 2011-07-29: qty 200

## 2011-07-29 MED ORDER — ATENOLOL 50 MG PO TABS
50.0000 mg | ORAL_TABLET | Freq: Every day | ORAL | Status: DC
  Administered 2011-07-29 – 2011-08-07 (×10): 50 mg via ORAL
  Filled 2011-07-29 (×11): qty 1

## 2011-07-29 MED ORDER — TRAVOPROST (BAK FREE) 0.004 % OP SOLN
1.0000 [drp] | Freq: Every day | OPHTHALMIC | Status: DC
  Administered 2011-07-29 – 2011-08-06 (×9): 1 [drp] via OPHTHALMIC
  Filled 2011-07-29: qty 2.5

## 2011-07-29 MED ORDER — PIPERACILLIN-TAZOBACTAM 3.375 G IVPB
3.3750 g | Freq: Three times a day (TID) | INTRAVENOUS | Status: DC
  Administered 2011-07-29 – 2011-08-02 (×13): 3.375 g via INTRAVENOUS
  Filled 2011-07-29 (×16): qty 50

## 2011-07-29 MED ORDER — LISINOPRIL 40 MG PO TABS
40.0000 mg | ORAL_TABLET | Freq: Every day | ORAL | Status: DC
  Administered 2011-07-29 – 2011-08-07 (×10): 40 mg via ORAL
  Filled 2011-07-29 (×11): qty 1

## 2011-07-29 MED ORDER — ALLOPURINOL 100 MG PO TABS
100.0000 mg | ORAL_TABLET | Freq: Every day | ORAL | Status: DC
  Administered 2011-07-29 – 2011-08-07 (×10): 100 mg via ORAL
  Filled 2011-07-29 (×11): qty 1

## 2011-07-29 MED ORDER — DEXTROSE-NACL 5-0.45 % IV SOLN
INTRAVENOUS | Status: DC
  Administered 2011-07-29: 12:00:00 via INTRAVENOUS

## 2011-07-29 MED ORDER — ENOXAPARIN SODIUM 40 MG/0.4ML ~~LOC~~ SOLN
40.0000 mg | SUBCUTANEOUS | Status: DC
  Administered 2011-07-30 – 2011-08-07 (×9): 40 mg via SUBCUTANEOUS
  Filled 2011-07-29 (×10): qty 0.4

## 2011-07-29 MED ORDER — SODIUM CHLORIDE 0.9 % IV SOLN: INTRAVENOUS | Status: DC

## 2011-07-29 MED ORDER — MEMANTINE HCL 10 MG PO TABS
10.0000 mg | ORAL_TABLET | Freq: Two times a day (BID) | ORAL | Status: DC
  Administered 2011-07-29 – 2011-08-07 (×19): 10 mg via ORAL
  Filled 2011-07-29 (×24): qty 1

## 2011-07-29 MED ORDER — VANCOMYCIN HCL 1000 MG IV SOLR
750.0000 mg | Freq: Once | INTRAVENOUS | Status: AC
  Administered 2011-07-29: 750 mg via INTRAVENOUS
  Filled 2011-07-29: qty 750

## 2011-07-29 MED ORDER — DONEPEZIL HCL 10 MG PO TABS
10.0000 mg | ORAL_TABLET | Freq: Every day | ORAL | Status: DC
  Administered 2011-07-29 – 2011-08-06 (×9): 10 mg via ORAL
  Filled 2011-07-29 (×11): qty 1

## 2011-07-29 MED ORDER — INSULIN ASPART 100 UNIT/ML ~~LOC~~ SOLN
0.0000 [IU] | Freq: Three times a day (TID) | SUBCUTANEOUS | Status: DC
  Administered 2011-07-29: 3 [IU] via SUBCUTANEOUS
  Administered 2011-07-29 – 2011-07-30 (×3): 8 [IU] via SUBCUTANEOUS
  Administered 2011-07-30: 5 [IU] via SUBCUTANEOUS
  Administered 2011-07-31: 11 [IU] via SUBCUTANEOUS
  Administered 2011-07-31: 8 [IU] via SUBCUTANEOUS

## 2011-07-29 MED ORDER — VITAMIN C 500 MG PO TABS
500.0000 mg | ORAL_TABLET | Freq: Two times a day (BID) | ORAL | Status: DC
  Administered 2011-07-29 – 2011-08-07 (×19): 500 mg via ORAL
  Filled 2011-07-29 (×23): qty 1

## 2011-07-29 MED ORDER — RISPERIDONE 0.25 MG PO TABS
0.2500 mg | ORAL_TABLET | Freq: Two times a day (BID) | ORAL | Status: DC
  Administered 2011-07-29 – 2011-08-07 (×19): 0.25 mg via ORAL
  Filled 2011-07-29 (×22): qty 1

## 2011-07-29 MED ORDER — IPRATROPIUM BROMIDE 0.02 % IN SOLN
0.5000 mg | Freq: Four times a day (QID) | RESPIRATORY_TRACT | Status: DC | PRN
  Administered 2011-07-29 – 2011-08-01 (×2): 0.5 mg via RESPIRATORY_TRACT
  Filled 2011-07-29 (×2): qty 2.5

## 2011-07-29 MED ORDER — SODIUM CHLORIDE 0.9 % IJ SOLN
3.0000 mL | Freq: Two times a day (BID) | INTRAMUSCULAR | Status: DC
  Administered 2011-07-29 – 2011-08-06 (×4): 3 mL via INTRAVENOUS

## 2011-07-29 MED ORDER — SENNOSIDES-DOCUSATE SODIUM 8.6-50 MG PO TABS
2.0000 | ORAL_TABLET | Freq: Every day | ORAL | Status: DC
  Administered 2011-07-29 – 2011-08-06 (×8): 2 via ORAL
  Filled 2011-07-29 (×11): qty 2

## 2011-07-29 MED ORDER — ASPIRIN EC 81 MG PO TBEC
81.0000 mg | DELAYED_RELEASE_TABLET | Freq: Every day | ORAL | Status: DC
  Administered 2011-07-29 – 2011-08-07 (×10): 81 mg via ORAL
  Filled 2011-07-29 (×11): qty 1

## 2011-07-29 MED ORDER — LORAZEPAM 0.5 MG PO TABS: 0.5000 mg | ORAL_TABLET | Freq: Every day | ORAL | Status: DC | PRN

## 2011-07-29 MED ORDER — ALBUTEROL SULFATE (5 MG/ML) 0.5% IN NEBU
5.0000 mg | INHALATION_SOLUTION | Freq: Four times a day (QID) | RESPIRATORY_TRACT | Status: DC | PRN
  Administered 2011-07-29: 2.5 mg via RESPIRATORY_TRACT
  Administered 2011-08-01: 5 mg via RESPIRATORY_TRACT
  Filled 2011-07-29: qty 1
  Filled 2011-07-29: qty 0.5

## 2011-07-29 NOTE — Progress Notes (Signed)
Social work consult added

## 2011-07-29 NOTE — Progress Notes (Signed)
ANTIBIOTIC CONSULT NOTE - INITIAL  Pharmacy Consult for Vancomycin and Zosyn  Indication: PNA  No Known Allergies  Patient Measurements: Height: 6' (182.9 cm) Weight: 215 lb 6.2 oz (97.7 kg) IBW/kg (Calculated) : 77.6  Adjusted Body Weight:   Vital Signs: Temp: 97.6 F (36.4 C) (03/23 0152) Temp src: Oral (03/23 0152) BP: 159/92 mmHg (03/23 0152) Pulse Rate: 80  (03/23 0152) Intake/Output from previous day:   Intake/Output from this shift:    Labs:  Basename 07/29/11 0125 07/28/11 2118 07/28/11 2100  WBC 8.6 -- 8.0  HGB 12.8* 16.7 16.2  PLT 305 -- 311  LABCREA -- -- --  CREATININE 1.67* 1.70* 1.73*   Estimated Creatinine Clearance: 49.1 ml/min (by C-G formula based on Cr of 1.67). No results found for this basename: VANCOTROUGH:2,VANCOPEAK:2,VANCORANDOM:2,GENTTROUGH:2,GENTPEAK:2,GENTRANDOM:2,TOBRATROUGH:2,TOBRAPEAK:2,TOBRARND:2,AMIKACINPEAK:2,AMIKACINTROU:2,AMIKACIN:2, in the last 72 hours   Microbiology: No results found for this or any previous visit (from the past 720 hour(s)).  Medical History: Past Medical History  Diagnosis Date  . Colon cancer   . Dementia   . CHF (congestive heart failure)   . Hypertension   . Hyperlipemia   . Diabetes mellitus   . History of CVA (cerebrovascular accident)   . History of colectomy   . Mild mental retardation     Medications:  Anti-infectives     Start     Dose/Rate Route Frequency Ordered Stop   07/29/11 1000   vancomycin (VANCOCIN) 750 mg in sodium chloride 0.9 % 150 mL IVPB        750 mg 150 mL/hr over 60 Minutes Intravenous  Once 07/29/11 0359     07/29/11 0600  piperacillin-tazobactam (ZOSYN) IVPB 3.375 g       3.375 g 12.5 mL/hr over 240 Minutes Intravenous 3 times per day 07/29/11 0124     07/29/11 0130   vancomycin (VANCOCIN) IVPB 1000 mg/200 mL premix  Status:  Discontinued        1,000 mg 200 mL/hr over 60 Minutes Intravenous  Once 07/29/11 0124 07/29/11 0238   07/28/11 2200   vancomycin (VANCOCIN)  IVPB 1000 mg/200 mL premix        1,000 mg 200 mL/hr over 60 Minutes Intravenous  Once 07/28/11 2155 07/29/11 0035   07/28/11 2200  piperacillin-tazobactam (ZOSYN) IVPB 3.375 g       3.375 g 100 mL/hr over 30 Minutes Intravenous  Once 07/28/11 2155 07/28/11 2321         Assessment: Patient with PNA.  First dose of antibiotics already given in ED.  Goal of Therapy:  Vancomycin trough level 15-20 mcg/ml Zosyn based on renal function   Plan:  Measure antibiotic drug levels at steady state Follow up culture results Vancomycin 750mg  iv q12hr Zosyn 3.375g IV Q8H infused over 4hrs.   [name], [name] 07/29/2011,4:00 AM

## 2011-07-29 NOTE — Progress Notes (Signed)
Subjective:  no new changes.   Objective: Weight change:   Intake/Output Summary (Last 24 hours) at 07/29/11 1113 Last data filed at 07/29/11 0620  Gross per 24 hour  Intake    300 ml  Output    450 ml  Net   -150 ml    Filed Vitals:   07/29/11 0618  BP: 174/95  Pulse: 87  Temp: 97.7 F (36.5 C)  Resp: 18   On exam he is comfortable and in no acute distress. CVS S1S2 HEARD Lungs scattered exp wheezing. Abdomen" soft non tender bowel sounds heard Extremities: no pedal edema.  Lab Results: Results for orders placed during the hospital encounter of 07/28/11 (from the past 24 hour(s))  CBC     Status: Normal   Collection Time   07/28/11  9:00 PM      Component Value Range   WBC 8.0  4.0 - 10.5 (K/uL)   RBC 5.06  4.22 - 5.81 (MIL/uL)   Hemoglobin 16.2  13.0 - 17.0 (g/dL)   HCT 81.1  91.4 - 78.2 (%)   MCV 99.6  78.0 - 100.0 (fL)   MCH 32.0  26.0 - 34.0 (pg)   MCHC 32.1  30.0 - 36.0 (g/dL)   RDW 95.6  21.3 - 08.6 (%)   Platelets 311  150 - 400 (K/uL)  DIFFERENTIAL     Status: Normal   Collection Time   07/28/11  9:00 PM      Component Value Range   Neutrophils Relative 58  43 - 77 (%)   Neutro Abs 4.7  1.7 - 7.7 (K/uL)   Lymphocytes Relative 33  12 - 46 (%)   Lymphs Abs 2.6  0.7 - 4.0 (K/uL)   Monocytes Relative 7  3 - 12 (%)   Monocytes Absolute 0.6  0.1 - 1.0 (K/uL)   Eosinophils Relative 2  0 - 5 (%)   Eosinophils Absolute 0.1  0.0 - 0.7 (K/uL)   Basophils Relative 1  0 - 1 (%)   Basophils Absolute 0.0  0.0 - 0.1 (K/uL)  COMPREHENSIVE METABOLIC PANEL     Status: Abnormal   Collection Time   07/28/11  9:00 PM      Component Value Range   Sodium 156 (*) 135 - 145 (mEq/L)   Potassium 4.5  3.5 - 5.1 (mEq/L)   Chloride 117 (*) 96 - 112 (mEq/L)   CO2 26  19 - 32 (mEq/L)   Glucose, Bld 141 (*) 70 - 99 (mg/dL)   BUN 37 (*) 6 - 23 (mg/dL)   Creatinine, Ser 5.78 (*) 0.50 - 1.35 (mg/dL)   Calcium 8.9  8.4 - 46.9 (mg/dL)   Total Protein 9.1 (*) 6.0 - 8.3 (g/dL)   Albumin 2.9 (*) 3.5 - 5.2 (g/dL)   AST 41 (*) 0 - 37 (U/L)   ALT 24  0 - 53 (U/L)   Alkaline Phosphatase 106  39 - 117 (U/L)   Total Bilirubin 0.3  0.3 - 1.2 (mg/dL)   GFR calc non Af Amer 38 (*) >90 (mL/min)   GFR calc Af Amer 44 (*) >90 (mL/min)  LACTIC ACID, PLASMA     Status: Normal   Collection Time   07/28/11  9:00 PM      Component Value Range   Lactic Acid, Venous 2.2  0.5 - 2.2 (mmol/L)  PROCALCITONIN     Status: Normal   Collection Time   07/28/11  9:00 PM      Component Value  Range   Procalcitonin <0.10    POCT I-STAT, CHEM 8     Status: Abnormal   Collection Time   07/28/11  9:18 PM      Component Value Range   Sodium 162 (*) 135 - 145 (mEq/L)   Potassium 3.9  3.5 - 5.1 (mEq/L)   Chloride 123 (*) 96 - 112 (mEq/L)   BUN 40 (*) 6 - 23 (mg/dL)   Creatinine, Ser 1.61 (*) 0.50 - 1.35 (mg/dL)   Glucose, Bld 096 (*) 70 - 99 (mg/dL)   Calcium, Ion 0.45 (*) 1.12 - 1.32 (mmol/L)   TCO2 27  0 - 100 (mmol/L)   Hemoglobin 16.7  13.0 - 17.0 (g/dL)   HCT 40.9  81.1 - 91.4 (%)   Comment NOTIFIED PHYSICIAN    TROPONIN I     Status: Normal   Collection Time   07/28/11 10:26 PM      Component Value Range   Troponin I <0.30  <0.30 (ng/mL)  URINALYSIS, ROUTINE W REFLEX MICROSCOPIC     Status: Abnormal   Collection Time   07/28/11 11:23 PM      Component Value Range   Color, Urine YELLOW  YELLOW    APPearance CLOUDY (*) CLEAR    Specific Gravity, Urine 1.032 (*) 1.005 - 1.030    pH 5.5  5.0 - 8.0    Glucose, UA NEGATIVE  NEGATIVE (mg/dL)   Hgb urine dipstick MODERATE (*) NEGATIVE    Bilirubin Urine NEGATIVE  NEGATIVE    Ketones, ur NEGATIVE  NEGATIVE (mg/dL)   Protein, ur >782 (*) NEGATIVE (mg/dL)   Urobilinogen, UA 1.0  0.0 - 1.0 (mg/dL)   Nitrite NEGATIVE  NEGATIVE    Leukocytes, UA NEGATIVE  NEGATIVE   URINE MICROSCOPIC-ADD ON     Status: Abnormal   Collection Time   07/28/11 11:23 PM      Component Value Range   Squamous Epithelial / LPF RARE  RARE    WBC, UA 0-2  <3  (WBC/hpf)   Casts GRANULAR CAST (*) NEGATIVE    Urine-Other AMORPHOUS URATES/PHOSPHATES    CBC     Status: Abnormal   Collection Time   07/29/11  1:25 AM      Component Value Range   WBC 8.6  4.0 - 10.5 (K/uL)   RBC 4.13 (*) 4.22 - 5.81 (MIL/uL)   Hemoglobin 12.8 (*) 13.0 - 17.0 (g/dL)   HCT 95.6  21.3 - 08.6 (%)   MCV 100.7 (*) 78.0 - 100.0 (fL)   MCH 31.0  26.0 - 34.0 (pg)   MCHC 30.8  30.0 - 36.0 (g/dL)   RDW 57.8  46.9 - 62.9 (%)   Platelets 305  150 - 400 (K/uL)  COMPREHENSIVE METABOLIC PANEL     Status: Abnormal   Collection Time   07/29/11  1:25 AM      Component Value Range   Sodium 156 (*) 135 - 145 (mEq/L)   Potassium 3.8  3.5 - 5.1 (mEq/L)   Chloride 121 (*) 96 - 112 (mEq/L)   CO2 25  19 - 32 (mEq/L)   Glucose, Bld 165 (*) 70 - 99 (mg/dL)   BUN 35 (*) 6 - 23 (mg/dL)   Creatinine, Ser 5.28 (*) 0.50 - 1.35 (mg/dL)   Calcium 7.6 (*) 8.4 - 10.5 (mg/dL)   Total Protein 8.0  6.0 - 8.3 (g/dL)   Albumin 2.5 (*) 3.5 - 5.2 (g/dL)   AST 27  0 - 37 (U/L)  ALT 24  0 - 53 (U/L)   Alkaline Phosphatase 88  39 - 117 (U/L)   Total Bilirubin 0.4  0.3 - 1.2 (mg/dL)   GFR calc non Af Amer 40 (*) >90 (mL/min)   GFR calc Af Amer 46 (*) >90 (mL/min)  MAGNESIUM     Status: Normal   Collection Time   07/29/11  1:25 AM      Component Value Range   Magnesium 2.1  1.5 - 2.5 (mg/dL)  PHOSPHORUS     Status: Normal   Collection Time   07/29/11  1:25 AM      Component Value Range   Phosphorus 4.0  2.3 - 4.6 (mg/dL)  DIFFERENTIAL     Status: Abnormal   Collection Time   07/29/11  1:25 AM      Component Value Range   Neutrophils Relative 79 (*) 43 - 77 (%)   Lymphocytes Relative 18  12 - 46 (%)   Monocytes Relative 2 (*) 3 - 12 (%)   Eosinophils Relative 1  0 - 5 (%)   Basophils Relative 0  0 - 1 (%)   Neutro Abs 6.8  1.7 - 7.7 (K/uL)   Lymphs Abs 1.5  0.7 - 4.0 (K/uL)   Monocytes Absolute 0.2  0.1 - 1.0 (K/uL)   Eosinophils Absolute 0.1  0.0 - 0.7 (K/uL)   Basophils Absolute 0.0  0.0  - 0.1 (K/uL)   RBC Morphology POLYCHROMASIA PRESENT     WBC Morphology TOXIC GRANULATION    APTT     Status: Abnormal   Collection Time   07/29/11  1:25 AM      Component Value Range   aPTT 40 (*) 24 - 37 (seconds)  PROTIME-INR     Status: Abnormal   Collection Time   07/29/11  1:25 AM      Component Value Range   Prothrombin Time 16.6 (*) 11.6 - 15.2 (seconds)   INR 1.32  0.00 - 1.49   TSH     Status: Normal   Collection Time   07/29/11  1:25 AM      Component Value Range   TSH 1.339  0.350 - 4.500 (uIU/mL)  PRO B NATRIURETIC PEPTIDE     Status: Abnormal   Collection Time   07/29/11  1:25 AM      Component Value Range   Pro B Natriuretic peptide (BNP) 396.6 (*) 0 - 125 (pg/mL)  CARDIAC PANEL(CRET KIN+CKTOT+MB+TROPI)     Status: Abnormal   Collection Time   07/29/11  1:25 AM      Component Value Range   Total CK 514 (*) 7 - 232 (U/L)   CK, MB 2.3  0.3 - 4.0 (ng/mL)   Troponin I <0.30  <0.30 (ng/mL)   Relative Index 0.4  0.0 - 2.5   HEMOGLOBIN A1C     Status: Abnormal   Collection Time   07/29/11  1:25 AM      Component Value Range   Hemoglobin A1C 6.3 (*) <5.7 (%)   Mean Plasma Glucose 134 (*) <117 (mg/dL)  MRSA PCR SCREENING     Status: Normal   Collection Time   07/29/11  2:18 AM      Component Value Range   MRSA by PCR NEGATIVE  NEGATIVE   GLUCOSE, CAPILLARY     Status: Abnormal   Collection Time   07/29/11  2:39 AM      Component Value Range   Glucose-Capillary 169 (*) 70 - 99 (  mg/dL)  COMPREHENSIVE METABOLIC PANEL     Status: Abnormal   Collection Time   07/29/11  5:33 AM      Component Value Range   Sodium 155 (*) 135 - 145 (mEq/L)   Potassium 3.9  3.5 - 5.1 (mEq/L)   Chloride 121 (*) 96 - 112 (mEq/L)   CO2 25  19 - 32 (mEq/L)   Glucose, Bld 191 (*) 70 - 99 (mg/dL)   BUN 36 (*) 6 - 23 (mg/dL)   Creatinine, Ser 7.82 (*) 0.50 - 1.35 (mg/dL)   Calcium 7.8 (*) 8.4 - 10.5 (mg/dL)   Total Protein 8.3  6.0 - 8.3 (g/dL)   Albumin 2.6 (*) 3.5 - 5.2 (g/dL)   AST 29  0  - 37 (U/L)   ALT 26  0 - 53 (U/L)   Alkaline Phosphatase 93  39 - 117 (U/L)   Total Bilirubin 0.4  0.3 - 1.2 (mg/dL)   GFR calc non Af Amer 42 (*) >90 (mL/min)   GFR calc Af Amer 48 (*) >90 (mL/min)  CBC     Status: Abnormal   Collection Time   07/29/11  5:33 AM      Component Value Range   WBC 7.5  4.0 - 10.5 (K/uL)   RBC 4.16 (*) 4.22 - 5.81 (MIL/uL)   Hemoglobin 13.1  13.0 - 17.0 (g/dL)   HCT 95.6  21.3 - 08.6 (%)   MCV 100.0  78.0 - 100.0 (fL)   MCH 31.5  26.0 - 34.0 (pg)   MCHC 31.5  30.0 - 36.0 (g/dL)   RDW 57.8  46.9 - 62.9 (%)   Platelets 284  150 - 400 (K/uL)  GLUCOSE, CAPILLARY     Status: Abnormal   Collection Time   07/29/11  7:25 AM      Component Value Range   Glucose-Capillary 195 (*) 70 - 99 (mg/dL)   Comment 1 Notify RN     Comment 2 Documented in Chart    CARDIAC PANEL(CRET KIN+CKTOT+MB+TROPI)     Status: Abnormal   Collection Time   07/29/11  8:20 AM      Component Value Range   Total CK 587 (*) 7 - 232 (U/L)   CK, MB 2.8  0.3 - 4.0 (ng/mL)   Troponin I <0.30  <0.30 (ng/mL)   Relative Index 0.5  0.0 - 2.5      Micro Results: Recent Results (from the past 240 hour(s))  MRSA PCR SCREENING     Status: Normal   Collection Time   07/29/11  2:18 AM      Component Value Range Status Comment   MRSA by PCR NEGATIVE  NEGATIVE  Final     Studies/Results: Dg Chest 1 View  07/28/2011  *RADIOLOGY REPORT*  Clinical Data: Fever, altered mental status, possible fall, history carcinoma of the colon, dementia, hypertension, diabetes, CHF, stroke  CHEST - 1 VIEW  Comparison: 08/15/2008  Findings: Minimal enlargement of cardiac silhouette. Tortuous aorta. Prior vascular congestion. Minimal right basilar atelectasis. Slight accentuation of perihilar interstitial markings since previous exam could reflect minimal failure. No segmental consolidation or pleural effusion. No pneumothorax. Bones unremarkable.  IMPRESSION: Enlargement of cardiac silhouette with pulmonary vascular  congestion. Minimal right basilar atelectasis. Cannot exclude minimal failure.  Original Report Authenticated By: Lollie Marrow, M.D.   Dg Pelvis 1-2 Views  07/28/2011  *RADIOLOGY REPORT*  Clinical Data: Altered mental status  PELVIS - 1-2 VIEW  Comparison: None  Findings: Osseous demineralization. Minimal narrowing  of hip joints bilaterally. SI joints symmetric preserved. No acute fracture, dislocation, or bone destruction. Scattered atherosclerotic calcifications of numerous pelvic phleboliths. Mild gaseous distention of colon through the rectum with a stool ball in rectum.  IMPRESSION: Osseous demineralization with minimal degenerative changes of the hip joints. No acute bony abnormalities.  Original Report Authenticated By: Lollie Marrow, M.D.   Dg Shoulder Right  07/28/2011  *RADIOLOGY REPORT*  Clinical Data: Fever, possible fall, altered mental status  RIGHT SHOULDER - 2+ VIEW  Comparison: None  Findings: Osseous demineralization. AC joint alignment normal. No acute fracture, dislocation, or bone destruction. Visualized right ribs intact.  IMPRESSION: No acute bony abnormalities.  Original Report Authenticated By: Lollie Marrow, M.D.   Ct Head Wo Contrast  07/28/2011  *RADIOLOGY REPORT*  Clinical Data:  Multiple falls.  Head and neck injury.  Altered mental status. Colon cancer.  CT HEAD WITHOUT CONTRAST CT CERVICAL SPINE WITHOUT CONTRAST  Technique:  Multidetector CT imaging of the head and cervical spine was performed following the standard protocol without intravenous contrast.  Multiplanar CT image reconstructions of the cervical spine were also generated.  Comparison:  Head CT on 11/19/2008  CT HEAD  Findings: There is no evidence of intracranial hemorrhage, brain edema or other signs of acute infarction.  There is no evidence of intracranial mass lesion or mass effect.  No abnormal extra-axial fluid collections are identified.  Old left parietal infarct is again seen with ex vacuo dilatation of  the lateral ventricle.  Multiple lacunar infarcts and chronic small vessel disease are again demonstrated as well as an old left cerebellar infarct.  Moderate cerebral atrophy is also stable. Heavy intracranial atherosclerotic calcification is noted.  No evidence of skull fracture or other bone abnormality.  IMPRESSION:  1.  No acute intracranial abnormality. 2.  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  CT CERVICAL SPINE  Findings: No evidence of cervical spine fracture or subluxation. Severe degenerative disc disease is seen at C4-5 and C6-7.  Mild facet DJD is seen bilaterally.  Atlantoaxial degenerative changes also noted.  IMPRESSION:  1.  No evidence of cervical spine fracture or subluxation. 2.  Degenerative cervical spondylosis.  Original Report Authenticated By: Danae Orleans, M.D.   Ct Cervical Spine Wo Contrast  07/28/2011  *RADIOLOGY REPORT*  Clinical Data:  Multiple falls.  Head and neck injury.  Altered mental status. Colon cancer.  CT HEAD WITHOUT CONTRAST CT CERVICAL SPINE WITHOUT CONTRAST  Technique:  Multidetector CT imaging of the head and cervical spine was performed following the standard protocol without intravenous contrast.  Multiplanar CT image reconstructions of the cervical spine were also generated.  Comparison:  Head CT on 11/19/2008  CT HEAD  Findings: There is no evidence of intracranial hemorrhage, brain edema or other signs of acute infarction.  There is no evidence of intracranial mass lesion or mass effect.  No abnormal extra-axial fluid collections are identified.  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  Multiple lacunar infarcts and chronic small vessel disease are again demonstrated as well as an old left cerebellar infarct.  Moderate cerebral atrophy is also stable. Heavy intracranial atherosclerotic calcification is noted.  No evidence of  skull fracture or other bone abnormality.  IMPRESSION:  1.  No acute intracranial abnormality. 2.  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  CT CERVICAL SPINE  Findings: No evidence of cervical spine fracture or subluxation. Severe degenerative disc disease is seen at C4-5 and C6-7.  Mild facet DJD is seen bilaterally.  Atlantoaxial degenerative changes also noted.  IMPRESSION:  1.  No evidence of cervical spine fracture or subluxation. 2.  Degenerative cervical spondylosis.  Original Report Authenticated By: Danae Orleans, M.D.   Medications: Scheduled Meds:   . albuterol  5 mg Nebulization Once  . allopurinol  100 mg Oral Daily  . amLODipine  10 mg Oral QPM  . aspirin EC  81 mg Oral Daily  . atenolol  50 mg Oral Daily  . betaxolol  1 drop Both Eyes BID  . carbamazepine  100 mg Oral Daily  . carbamazepine  200 mg Oral QHS  . cloNIDine  0.2 mg Oral TID  . donepezil  10 mg Oral QHS  . enoxaparin  30 mg Subcutaneous Q24H  . FLUoxetine  20 mg Oral Daily  . insulin aspart  0-15 Units Subcutaneous TID WC  . insulin aspart  0-5 Units Subcutaneous QHS  . ipratropium  0.5 mg Nebulization Once  . lisinopril  40 mg Oral Daily  . memantine  10 mg Oral BID  . methylPREDNISolone (SOLU-MEDROL) injection  125 mg Intravenous Once  . methylPREDNISolone (SOLU-MEDROL) injection  60 mg Intravenous Q12H  . piperacillin-tazobactam  3.375 g Intravenous Once  . piperacillin-tazobactam (ZOSYN)  IV  3.375 g Intravenous Q8H  . risperiDONE  0.25 mg Oral BID  . senna-docusate  2 tablet Oral QHS  . simvastatin  40 mg Oral QHS  . sodium chloride  1,000 mL Intravenous Once  . sodium chloride  2,000 mL Intravenous Once  . sodium chloride  3 mL Intravenous Q12H  . tolterodine  4 mg Oral Daily  . Travoprost (BAK Free)  1 drop Both Eyes QHS  . vancomycin  750 mg Intravenous Once  . vancomycin  1,000 mg Intravenous Once  . vitamin C   500 mg Oral BID  . DISCONTD: vancomycin  1,000 mg Intravenous Once   Continuous Infusions:   . dextrose 5 % and 0.45% NaCl    . DISCONTD: sodium chloride Stopped (07/29/11 0113)  . DISCONTD: sodium chloride 75 mL/hr at 07/29/11 0110   PRN Meds:.albuterol, ipratropium  Assessment/Plan: Patient Active Hospital Problem List: Hypernatremia  Improving. - start IV fluids dextrose 1/2 NS @75  cc/hr  - check urin electrolytes  - follow up BMP Q12hr.   Acute kidney injury  - likely prerenal secondary to dehydration  - provide IV fluids, d5 1/2 @ 75 cc/hr  - follow up BMP in am  Aspiration pneumonia   vancomycin and zosyn per pharmacy  - speech and swallow evaluation recommended pureed diet with honey thick liq with full assist.  - aspiration precaution  - nebulizers as needed for shortness of breath  - solumedrol 60 mg Q 12 hours (patient had some crackles on physical exam and audible wheezing)  Hypertension: controlled.  Dementia: on aricept and namenda  DVT Prophylaxis - lovenox sub q  Code Status - DNR    LOS: 1 day   [name] 07/29/2011, 11:13 AM

## 2011-07-29 NOTE — Evaluation (Signed)
Clinical/Bedside Swallow Evaluation Patient Details  Name: [name] MRN: [id number] DOB: [birth year] Today's Date: 07/29/2011  Past Medical History:  Past Medical History  Diagnosis Date  . Colon cancer   . Dementia   . CHF (congestive heart failure)   . Hypertension   . Hyperlipemia   . Diabetes mellitus   . History of CVA (cerebrovascular accident)   . History of colectomy   . Mild mental retardation    Past Surgical History:  Past Surgical History  Procedure Date  . Colectomy 08/2008   HPI:  71 y/o male admitted to [facility] ED from nursing home with AMS with concern for aspiration.   CT scan completed indicates no acute intracranial findings .  PMH significant for demnital, MR, CVA, CHF, and colon cancer.  No prior reports of dysphagia in e-chart. Treating SLP contacted  nursing home via phone  for current diet.   Per nursing  diet was recently changed to puree with nectar thick liquids.     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 Patient required verbal cues to initiate swallow for each trial.  Patient's lingual area noted to be coated.   Moderate pharyngeal dysphagia marked by delay in initiation with immediate s/s of aspiration s/p swallow of thin liquids and nectar thick liquids.  Patient with history of dementia and recent admission for AMS but   able to follow simple verbal  directives  ( swallow strategies to decrease s/s of aspiration)) with moderate assist.  Recommend ST treatment in acute care setting for diet tolerance and possible diet advancement.   Completion of objective assessment of MBS to be determined.   Other Related Risk Factors: Decreased respiratory status;Previous CVA;Cognitive impairment;Lethargy  Swallow Evaluation Recommendations Solid Consistency: Dysphagia 1 (Puree) Liquid Consistency:  Honey Liquid Administration via: Cup Medication Administration: Crushed with puree Compensations: Slow rate;Small sips/bites;Clear throat intermittently Postural Changes and/or Swallow Maneuvers: Seated upright 90 degrees;Upright 30-60 min after meal Oral Care Recommendations: Oral care QID Other Recommendations: Order thickener from pharmacy;Prohibited food (jello, ice cream, thin soups);Clarify dietary restrictions Follow up Recommendations: Skilled Nursing facility  Treatment Plan Speech Therapy Frequency: min 2x/week Treatment Duration: 2 weeks Interventions: Aspiration precaution training;Trials of upgraded texture/liquids;Diet toleration management by SLP;Compensatory techniques;Patient/family education  Prognosis Prognosis for Safe Diet Advancement: Fair Barriers to Reach Goals: Cognitive deficits  Individuals Consulted Consulted and Agree with Results and Recommendations: RN;Patient  Swallowing Goals  SLP Swallowing Goals Patient will consume recommended diet without observed clinical signs of aspiration with: Moderate assistance Patient will utilize recommended strategies during swallow to increase swallowing safety with: Moderate assistance    General  Date of Onset: 07/28/11 HPI: 71 y/o male admitted to [facility] ED from nursing home with AMS and  concern for aspiration.   CT scan completed indicates no acute intracranial findings .  PMH significant for demnita, MR, CVA, CHF, and colon cancer.  No prior reports of dysphagia in e-chart. Treating SLP contacted  nursing home,where patient was admitted , via phone  for current diet consistency.   Per nursing  diet was recently changed to puree consistency with nectar thick liquids.   Type of Study: Bedside swallow evaluation Diet Prior to this Study: NPO Respiratory Status: Supplemental O2 delivered via (comment) History of Intubation: No Behavior/Cognition: Alert;Cooperative;Pleasant mood;Confused;Requires cueing Oral Cavity -  Dentition: Edentulous Vision: Impaired for self-feeding Patient Positioning: Upright in bed  Baseline Vocal Quality: Hoarse;Low vocal intensity Volitional Cough: Weak Volitional Swallow: Able to elicit  Oral Motor/Sensory Function  Overall Oral Motor/Sensory Function: Appears within functional limits for tasks assessed  Consistency Results  Ice Chips Ice chips: Not tested  Thin Liquid Thin Liquid: Impaired Presentation: Spoon;Cup Oral Phase Impairments: Reduced labial seal;Reduced lingual movement/coordination;Impaired anterior to posterior transit;Poor awareness of bolus Oral Phase Functional Implications: Prolonged oral transit;Oral holding Pharyngeal  Phase Impairments: Delayed Swallow;Decreased hyoid-laryngeal movement;Cough - Immediate;Wet Vocal Quality Other Comments: cough immediately after sip of thin water by cup  Nectar Thick Liquid Nectar Thick Liquid: Impaired Presentation: Cup Oral Phase Impairments: Poor awareness of bolus;Reduced lingual movement/coordination;Impaired anterior to posterior transit Oral phase functional implications: Oral holding;Prolonged oral transit Pharyngeal Phase Impairments: Decreased hyoid-laryngeal movement  Honey Thick Liquid Honey Thick Liquid: Impaired Presentation: Cup Oral Phase Functional Implications: Oral holding;Prolonged oral transit Pharyngeal Phase Impairments: Delayed Swallow;Decreased hyoid-laryngeal movement  Puree Puree: Impaired Presentation: Spoon Oral Phase Impairments: Impaired anterior to posterior transit;Poor awareness of bolus Oral Phase Functional Implications: Oral holding;Prolonged oral transit Pharyngeal Phase Impairments: Delayed Swallow;Decreased hyoid-laryngeal movement  Solid Solid: Not tested Moreen Fowler M.S., CCC-SLP [phone]  [facility] 07/29/2011,11:18 AM

## 2011-07-29 NOTE — Progress Notes (Signed)
The order for simvastatin(Zocor) was changed to an equivalent dose of atorvastatin(Lipitor) due to the potential drug interaction with amlodipine.  When taken in combination with medications that inhibit its metabolism, simvastatin can accumulate which increases the risk of liver toxicity, myopathy, or rhabdomyolysis.  Simvastatin dose should not exceed 10mg /day in patients taking verapamil, diltiazem, fibrates, or niacin >or= 1g/day.   Simvastatin dose should not exceed 20mg /day in patients taking amlodipine, ranolazine or amiodarone.   Please consider this potential interaction at discharge.  [name] 07/29/2011 12:19 PM

## 2011-07-29 NOTE — H&P (Signed)
PCP:  No primary provider on file.   DOA:  07/28/2011  8:24 PM  Chief Complaint:  Altered mental status  HPI: 71 year old male from NH with multiple co morbidities who presented with altered mental status as per family member. Due to patient's mental status unable to obtain history from him and no family is present at bedside so history was obtained from ED chart and ED physician. As per ED MD patient appeared to be increasingly confused over last few days with occasional coughs and family was concerned that he may have aspirated because on occasion there would be food coming from his mouth. In regards to patient's confusion he appeared to be disoriented and although he does have an underlying dementia he was somewhat verbal but over last few days his speech has deteriorated. There was no fever or chills reported in NH, no abdominal pain, no nausea or vomiting.  Assessment/Plan  Principal Problem:   *Altered mental status - perhaps secondary to progressively worsening dementia versus aspiration pneumonia versus hypernatremia - CT head with no significant acute intracranial findings - PT evaluation - SLP evaluation - please do not start home medications until swallow evaluation completed  Active Problems:   Hypernatremia - start IV fluids NS @75  cc/hr - check urin electrolytes - follow up BMP in am   Acute kidney injury - likely prerenal secondary to dehydration - provide IV fluids, NS @ 75 cc/hr - follow up BMP in am   Aspiration pneumonia - start vancomycin and zosyn per pharmacy - speech and swallow evaluation - aspiration precaution - nebulizers as needed for shortness of breath - solumedrol 60 mg Q 12 hours (patient had some crackles on physical exam and audible wheezing)  DVT Prophylaxis - lovenox sub q  Code Status - DNR   Allergies: No Known Allergies  Prior to Admission medications   Medication Sig Start Date End Date Taking? Authorizing Provider  allopurinol  (ZYLOPRIM) 100 MG tablet Take 100 mg by mouth daily.    Yes Historical Provider, MD  amLODipine (NORVASC) 10 MG tablet Take 10 mg by mouth every evening. Hold for SBP<110   Yes Historical Provider, MD  aspirin 81 MG tablet Take 81 mg by mouth daily.     Yes Historical Provider, MD  atenolol (TENORMIN) 50 MG tablet Take 50 mg by mouth daily.   Yes Historical Provider, MD  AZO-CRANBERRY PO Take 475 mg by mouth 2 (two) times daily.     Yes Historical Provider, MD  betaxolol (BETOPTIC-S) 0.25 % ophthalmic suspension Place 1 drop into both eyes 2 (two) times daily.     Yes Historical Provider, MD  carbamazepine (TEGRETOL) 100 MG chewable tablet Chew 100-200 mg by mouth 2 (two) times daily. Takes 100 mg in the am and 200 mg every evening   Yes Historical Provider, MD  cloNIDine (CATAPRES) 0.2 MG tablet Take 0.2 mg by mouth 3 (three) times daily. Hold for SBP<110   Yes Historical Provider, MD  donepezil (ARICEPT) 10 MG tablet Take 10 mg by mouth at bedtime.    Yes Historical Provider, MD  FLUoxetine (PROZAC) 20 MG tablet Take 20 mg by mouth daily.     Yes Historical Provider, MD  insulin aspart (NOVOLOG) 100 UNIT/ML injection Inject into the skin 3 (three) times daily before meals. Sliding scale 70-120 no insulin 121-150=3 units 151-200=4 units 201-250=7 units 251-300=11 units 351-400=20 units   Yes Historical Provider, MD  insulin glargine (LANTUS) 100 UNIT/ML injection Inject 65-70 Units into  the skin 2 (two) times daily. 70 units every morning and 65 units at bedtime   Yes Historical Provider, MD  lisinopril (PRINIVIL,ZESTRIL) 40 MG tablet Take 40 mg by mouth daily.     Yes Historical Provider, MD  memantine (NAMENDA) 10 MG tablet Take 10 mg by mouth 2 (two) times daily.     Yes Historical Provider, MD  metFORMIN (GLUCOPHAGE) 1000 MG tablet Take 1,000 mg by mouth 2 (two) times daily with a meal.   Yes Historical Provider, MD  risperiDONE (RISPERDAL) 0.25 MG tablet Take 0.25 mg by mouth 2 (two) times  daily.   Yes Historical Provider, MD  sennosides-docusate sodium (SENOKOT-S) 8.6-50 MG tablet Take 2 tablets by mouth at bedtime.    Yes Historical Provider, MD  simvastatin (ZOCOR) 40 MG tablet Take 40 mg by mouth at bedtime.     Yes Historical Provider, MD  tolterodine (DETROL LA) 4 MG 24 hr capsule Take 4 mg by mouth daily.   Yes Historical Provider, MD  travoprost, benzalkonium, (TRAVATAN) 0.004 % ophthalmic solution Place 1 drop into both eyes at bedtime.     Yes Historical Provider, MD  vitamin C (ASCORBIC ACID) 500 MG tablet Take 500 mg by mouth 2 (two) times daily.    Yes Historical Provider, MD    Past Medical History  Diagnosis Date  . Colon cancer   . Dementia   . CHF (congestive heart failure)   . Hypertension   . Hyperlipemia   . Diabetes mellitus   . History of CVA (cerebrovascular accident)   . History of colectomy   . Mild mental retardation     Past Surgical History  Procedure Date  . Colectomy 08/2008    Social History:  does not have a smoking history on file. He does not have any smokeless tobacco history on file. He reports that he does not drink alcohol. His drug history not on file.  History reviewed. No pertinent family history.  Review of Systems:  Unable to obtain due to patient mental status  Physical Exam:  Filed Vitals:   07/28/11 2315 07/28/11 2330 07/28/11 2345 07/29/11 0000  BP: 149/93 136/85 133/85 137/87  Pulse: 80 78 77 76  Temp:  98.8 F (37.1 C) 99.4 F (37.4 C) 99.5 F (37.5 C)  TempSrc:      Resp: 31 26 28 27   SpO2: 93% 93% 93% 92%    Constitutional: Vital signs reviewed.  Patient is in no acute distress; minimally verbal Head: Normocephalic and atraumatic Ear: TM normal bilaterally Mouth: no erythema or exudates, dry mucus membranes Eyes: PERRL, conjunctivae normal, No scleral icterus.  Neck: Supple, Trachea midline normal ROM, No JVD, mass, thyromegaly, or carotid bruit present.  Cardiovascular: RRR, S1 normal, S2 normal,  no MRG, pulses symmetric and intact bilaterally Pulmonary/Chest: expiratory wheezing and inspiratory wheezing Abdominal: Soft. Non-tender, distended, bowel sounds are normal, no masses, organomegaly, or guarding present.  GU: no CVA tenderness Musculoskeletal: No joint deformities, erythema, or stiffness, ROM full and no nontender Ext: no edema and no cyanosis, pulses palpable bilaterally (DP and PT) Hematology: no cervical, inginal, or axillary adenopathy.  Neurological: A&O x3, Strenght is normal and symmetric bilaterally, cranial nerve II-XII are grossly intact, no focal motor deficit, sensory intact to light touch bilaterally.  Skin: Warm, dry and intact. No rash, cyanosis, or clubbing.  Psychiatric: Normal mood and affect. speech and behavior is normal. Judgment and thought content normal. Cognition and memory are normal.   Labs on Admission:  Results for orders placed during the hospital encounter of 07/28/11 (from the past 48 hour(s))  CBC     Status: Normal   Collection Time   07/28/11  9:00 PM      Component Value Range Comment   WBC 8.0  4.0 - 10.5 (K/uL)    RBC 5.06  4.22 - 5.81 (MIL/uL)    Hemoglobin 16.2  13.0 - 17.0 (g/dL)    HCT 14.7  82.9 - 56.2 (%)    MCV 99.6  78.0 - 100.0 (fL)    MCH 32.0  26.0 - 34.0 (pg)    MCHC 32.1  30.0 - 36.0 (g/dL)    RDW 13.0  86.5 - 78.4 (%)    Platelets 311  150 - 400 (K/uL)   DIFFERENTIAL     Status: Normal   Collection Time   07/28/11  9:00 PM      Component Value Range Comment   Neutrophils Relative 58  43 - 77 (%)    Neutro Abs 4.7  1.7 - 7.7 (K/uL)    Lymphocytes Relative 33  12 - 46 (%)    Lymphs Abs 2.6  0.7 - 4.0 (K/uL)    Monocytes Relative 7  3 - 12 (%)    Monocytes Absolute 0.6  0.1 - 1.0 (K/uL)    Eosinophils Relative 2  0 - 5 (%)    Eosinophils Absolute 0.1  0.0 - 0.7 (K/uL)    Basophils Relative 1  0 - 1 (%)    Basophils Absolute 0.0  0.0 - 0.1 (K/uL)   COMPREHENSIVE METABOLIC PANEL     Status: Abnormal   Collection  Time   07/28/11  9:00 PM      Component Value Range Comment   Sodium 156 (*) 135 - 145 (mEq/L)    Potassium 4.5  3.5 - 5.1 (mEq/L)    Chloride 117 (*) 96 - 112 (mEq/L)    CO2 26  19 - 32 (mEq/L)    Glucose, Bld 141 (*) 70 - 99 (mg/dL)    BUN 37 (*) 6 - 23 (mg/dL)    Creatinine, Ser 6.96 (*) 0.50 - 1.35 (mg/dL)    Calcium 8.9  8.4 - 10.5 (mg/dL)    Total Protein 9.1 (*) 6.0 - 8.3 (g/dL)    Albumin 2.9 (*) 3.5 - 5.2 (g/dL)    AST 41 (*) 0 - 37 (U/L) NO VISIBLE HEMOLYSIS   ALT 24  0 - 53 (U/L)    Alkaline Phosphatase 106  39 - 117 (U/L)    Total Bilirubin 0.3  0.3 - 1.2 (mg/dL)    GFR calc non Af Amer 38 (*) >90 (mL/min)    GFR calc Af Amer 44 (*) >90 (mL/min)   LACTIC ACID, PLASMA     Status: Normal   Collection Time   07/28/11  9:00 PM      Component Value Range Comment   Lactic Acid, Venous 2.2  0.5 - 2.2 (mmol/L)   PROCALCITONIN     Status: Normal   Collection Time   07/28/11  9:00 PM      Component Value Range Comment   Procalcitonin <0.10     POCT I-STAT, CHEM 8     Status: Abnormal   Collection Time   07/28/11  9:18 PM      Component Value Range Comment   Sodium 162 (*) 135 - 145 (mEq/L)    Potassium 3.9  3.5 - 5.1 (mEq/L)    Chloride 123 (*) 96 -  112 (mEq/L)    BUN 40 (*) 6 - 23 (mg/dL)    Creatinine, Ser 1.61 (*) 0.50 - 1.35 (mg/dL)    Glucose, Bld 096 (*) 70 - 99 (mg/dL)    Calcium, Ion 0.45 (*) 1.12 - 1.32 (mmol/L)    TCO2 27  0 - 100 (mmol/L)    Hemoglobin 16.7  13.0 - 17.0 (g/dL)    HCT 40.9  81.1 - 91.4 (%)    Comment NOTIFIED PHYSICIAN     TROPONIN I     Status: Normal   Collection Time   07/28/11 10:26 PM      Component Value Range Comment   Troponin I <0.30  <0.30 (ng/mL)   URINALYSIS, ROUTINE W REFLEX MICROSCOPIC     Status: Abnormal   Collection Time   07/28/11 11:23 PM      Component Value Range Comment   Color, Urine YELLOW  YELLOW     APPearance CLOUDY (*) CLEAR     Specific Gravity, Urine 1.032 (*) 1.005 - 1.030     pH 5.5  5.0 - 8.0     Glucose,  UA NEGATIVE  NEGATIVE (mg/dL)    Hgb urine dipstick MODERATE (*) NEGATIVE     Bilirubin Urine NEGATIVE  NEGATIVE     Ketones, ur NEGATIVE  NEGATIVE (mg/dL)    Protein, ur >782 (*) NEGATIVE (mg/dL)    Urobilinogen, UA 1.0  0.0 - 1.0 (mg/dL)    Nitrite NEGATIVE  NEGATIVE     Leukocytes, UA NEGATIVE  NEGATIVE    URINE MICROSCOPIC-ADD ON     Status: Abnormal   Collection Time   07/28/11 11:23 PM      Component Value Range Comment   Squamous Epithelial / LPF RARE  RARE     WBC, UA 0-2  <3 (WBC/hpf)    Casts GRANULAR CAST (*) NEGATIVE     Urine-Other AMORPHOUS URATES/PHOSPHATES       Time Spent on Admission: Over 30 minutes  Kasiyah Platter 07/29/2011, 12:54 AM  [facility] Pager # [phone] Main Office # [phone]

## 2011-07-30 LAB — GLUCOSE, CAPILLARY
Glucose-Capillary: 256 mg/dL — ABNORMAL HIGH (ref 70–99)
Glucose-Capillary: 279 mg/dL — ABNORMAL HIGH (ref 70–99)
Glucose-Capillary: 384 mg/dL — ABNORMAL HIGH (ref 70–99)

## 2011-07-30 LAB — BASIC METABOLIC PANEL
BUN: 37 mg/dL — ABNORMAL HIGH (ref 6–23)
Chloride: 121 mEq/L — ABNORMAL HIGH (ref 96–112)
Glucose, Bld: 280 mg/dL — ABNORMAL HIGH (ref 70–99)
Potassium: 3.6 mEq/L (ref 3.5–5.1)
Sodium: 156 mEq/L — ABNORMAL HIGH (ref 135–145)

## 2011-07-30 LAB — CBC
Hemoglobin: 12 g/dL — ABNORMAL LOW (ref 13.0–17.0)
MCH: 31.2 pg (ref 26.0–34.0)
MCV: 99.5 fL (ref 78.0–100.0)
Platelets: 288 10*3/uL (ref 150–400)
RBC: 3.85 MIL/uL — ABNORMAL LOW (ref 4.22–5.81)
WBC: 8.8 10*3/uL (ref 4.0–10.5)

## 2011-07-30 LAB — CREATININE, URINE, 24 HOUR
Creatinine, 24H Ur: 2148 mg/d — ABNORMAL HIGH (ref 800–2000)
Creatinine, Urine: 128.3 mg/dL

## 2011-07-30 LAB — URINE CULTURE
Colony Count: NO GROWTH
Culture  Setup Time: 201303230611

## 2011-07-30 MED ORDER — METHYLPREDNISOLONE SODIUM SUCC 40 MG IJ SOLR
40.0000 mg | Freq: Two times a day (BID) | INTRAMUSCULAR | Status: DC
Start: 2011-07-31 — End: 2011-07-31
  Administered 2011-07-31 (×2): 40 mg via INTRAVENOUS
  Filled 2011-07-30 (×5): qty 1

## 2011-07-30 MED ORDER — DEXTROSE 5 % IV SOLN
INTRAVENOUS | Status: AC
  Administered 2011-07-30: 1000 mL via INTRAVENOUS

## 2011-07-30 NOTE — Progress Notes (Signed)
Subjective:  pt is more awake and alert.   Objective: Weight change: -1.1 kg (-2 lb 6.8 oz)  Intake/Output Summary (Last 24 hours) at 07/30/11 1650 Last data filed at 07/30/11 1645  Gross per 24 hour  Intake 2881.25 ml  Output   2225 ml  Net 656.25 ml    Filed Vitals:   07/30/11 1412  BP: 163/89  Pulse: 67  Temp: 98.9 F (37.2 C)  Resp: 18   On exam he is comfortable and in no acute distress.  CVS S1S2 HEARD  Lungs scattered exp wheezing.  Abdomen" soft non tender bowel sounds heard  Extremities: no pedal edema.    Lab Results: Results for orders placed during the hospital encounter of 07/28/11 (from the past 24 hour(s))  CARDIAC PANEL(CRET KIN+CKTOT+MB+TROPI)     Status: Abnormal   Collection Time   07/29/11  4:53 PM      Component Value Range   Total CK 601 (*) 7 - 232 (U/L)   CK, MB 2.9  0.3 - 4.0 (ng/mL)   Troponin I <0.30  <0.30 (ng/mL)   Relative Index 0.5  0.0 - 2.5   GLUCOSE, CAPILLARY     Status: Abnormal   Collection Time   07/29/11  5:11 PM      Component Value Range   Glucose-Capillary 251 (*) 70 - 99 (mg/dL)   Comment 1 Notify RN     Comment 2 Documented in Chart    GLUCOSE, CAPILLARY     Status: Abnormal   Collection Time   07/29/11  9:25 PM      Component Value Range   Glucose-Capillary 301 (*) 70 - 99 (mg/dL)  BASIC METABOLIC PANEL     Status: Abnormal   Collection Time   07/29/11 11:26 PM      Component Value Range   Sodium 155 (*) 135 - 145 (mEq/L)   Potassium 3.5  3.5 - 5.1 (mEq/L)   Chloride 122 (*) 96 - 112 (mEq/L)   CO2 27  19 - 32 (mEq/L)   Glucose, Bld 276 (*) 70 - 99 (mg/dL)   BUN 39 (*) 6 - 23 (mg/dL)   Creatinine, Ser 0.27 (*) 0.50 - 1.35 (mg/dL)   Calcium 7.8 (*) 8.4 - 10.5 (mg/dL)   GFR calc non Af Amer 37 (*) >90 (mL/min)   GFR calc Af Amer 43 (*) >90 (mL/min)  CBC     Status: Abnormal   Collection Time   07/30/11  5:50 AM      Component Value Range   WBC 8.8  4.0 - 10.5 (K/uL)   RBC 3.85 (*) 4.22 - 5.81 (MIL/uL)   Hemoglobin 12.0 (*) 13.0 - 17.0 (g/dL)   HCT 25.3 (*) 66.4 - 52.0 (%)   MCV 99.5  78.0 - 100.0 (fL)   MCH 31.2  26.0 - 34.0 (pg)   MCHC 31.3  30.0 - 36.0 (g/dL)   RDW 40.3  47.4 - 25.9 (%)   Platelets 288  150 - 400 (K/uL)  GLUCOSE, CAPILLARY     Status: Abnormal   Collection Time   07/30/11  7:52 AM      Component Value Range   Glucose-Capillary 256 (*) 70 - 99 (mg/dL)  CREATININE, URINE, 24 HOUR     Status: Abnormal   Collection Time   07/30/11  9:44 AM      Component Value Range   Urine Total Volume-UCRE24 1675     Collection Interval-UCRE24 24     Creatinine, Urine 128.3  Creatinine, 24H Ur 2148 (*) 800 - 2000 (mg/day)  GLUCOSE, CAPILLARY     Status: Abnormal   Collection Time   07/30/11 11:27 AM      Component Value Range   Glucose-Capillary 279 (*) 70 - 99 (mg/dL)  BASIC METABOLIC PANEL     Status: Abnormal   Collection Time   07/30/11 11:45 AM      Component Value Range   Sodium 156 (*) 135 - 145 (mEq/L)   Potassium 3.6  3.5 - 5.1 (mEq/L)   Chloride 121 (*) 96 - 112 (mEq/L)   CO2 28  19 - 32 (mEq/L)   Glucose, Bld 280 (*) 70 - 99 (mg/dL)   BUN 37 (*) 6 - 23 (mg/dL)   Creatinine, Ser 6.21 (*) 0.50 - 1.35 (mg/dL)   Calcium 7.7 (*) 8.4 - 10.5 (mg/dL)   GFR calc non Af Amer 39 (*) >90 (mL/min)   GFR calc Af Amer 46 (*) >90 (mL/min)     Micro Results: Recent Results (from the past 240 hour(s))  CULTURE, BLOOD (ROUTINE X 2)     Status: Normal (Preliminary result)   Collection Time   07/28/11 10:40 PM      Component Value Range Status Comment   Specimen Description BLOOD LEFT ANTECUBITAL   Final    Special Requests     Final    Value: BOTTLES DRAWN AEROBIC AND ANAEROBIC AERO 6CC ANA 4 CC   Culture  Setup Time [id number]   Final    Culture     Final    Value:        BLOOD CULTURE RECEIVED NO GROWTH TO DATE CULTURE WILL BE HELD FOR 5 DAYS BEFORE ISSUING A FINAL NEGATIVE REPORT   Report Status PENDING   Incomplete   CULTURE, BLOOD (ROUTINE X 2)     Status: Normal  (Preliminary result)   Collection Time   07/28/11 10:45 PM      Component Value Range Status Comment   Specimen Description BLOOD LEFT HAND   Final    Special Requests     Final    Value: BOTTLES DRAWN AEROBIC AND ANAEROBIC AERO 6 CC ANA 4 CC   Culture  Setup Time [id number]   Final    Culture     Final    Value:        BLOOD CULTURE RECEIVED NO GROWTH TO DATE CULTURE WILL BE HELD FOR 5 DAYS BEFORE ISSUING A FINAL NEGATIVE REPORT   Report Status PENDING   Incomplete   URINE CULTURE     Status: Normal   Collection Time   07/28/11 11:23 PM      Component Value Range Status Comment   Specimen Description URINE, CATHETERIZED   Final    Special Requests NONE   Final    Culture  Setup Time [id number]   Final    Colony Count NO GROWTH   Final    Culture NO GROWTH   Final    Report Status 07/30/2011 FINAL   Final   MRSA PCR SCREENING     Status: Normal   Collection Time   07/29/11  2:18 AM      Component Value Range Status Comment   MRSA by PCR NEGATIVE  NEGATIVE  Final     Studies/Results: Dg Chest 1 View  07/28/2011  *RADIOLOGY REPORT*  Clinical Data: Fever, altered mental status, possible fall, history carcinoma of the colon, dementia, hypertension, diabetes, CHF, stroke  CHEST - 1 VIEW  Comparison:  08/15/2008  Findings: Minimal enlargement of cardiac silhouette. Tortuous aorta. Prior vascular congestion. Minimal right basilar atelectasis. Slight accentuation of perihilar interstitial markings since previous exam could reflect minimal failure. No segmental consolidation or pleural effusion. No pneumothorax. Bones unremarkable.  IMPRESSION: Enlargement of cardiac silhouette with pulmonary vascular congestion. Minimal right basilar atelectasis. Cannot exclude minimal failure.  Original Report Authenticated By: Lollie Marrow, M.D.   Dg Pelvis 1-2 Views  07/28/2011  *RADIOLOGY REPORT*  Clinical Data: Altered mental status  PELVIS - 1-2 VIEW  Comparison: None  Findings: Osseous  demineralization. Minimal narrowing of hip joints bilaterally. SI joints symmetric preserved. No acute fracture, dislocation, or bone destruction. Scattered atherosclerotic calcifications of numerous pelvic phleboliths. Mild gaseous distention of colon through the rectum with a stool ball in rectum.  IMPRESSION: Osseous demineralization with minimal degenerative changes of the hip joints. No acute bony abnormalities.  Original Report Authenticated By: Lollie Marrow, M.D.   Dg Shoulder Right  07/28/2011  *RADIOLOGY REPORT*  Clinical Data: Fever, possible fall, altered mental status  RIGHT SHOULDER - 2+ VIEW  Comparison: None  Findings: Osseous demineralization. AC joint alignment normal. No acute fracture, dislocation, or bone destruction. Visualized right ribs intact.  IMPRESSION: No acute bony abnormalities.  Original Report Authenticated By: Lollie Marrow, M.D.   Ct Head Wo Contrast  07/28/2011  *RADIOLOGY REPORT*  Clinical Data:  Multiple falls.  Head and neck injury.  Altered mental status. Colon cancer.  CT HEAD WITHOUT CONTRAST CT CERVICAL SPINE WITHOUT CONTRAST  Technique:  Multidetector CT imaging of the head and cervical spine was performed following the standard protocol without intravenous contrast.  Multiplanar CT image reconstructions of the cervical spine were also generated.  Comparison:  Head CT on 11/19/2008  CT HEAD  Findings: There is no evidence of intracranial hemorrhage, brain edema or other signs of acute infarction.  There is no evidence of intracranial mass lesion or mass effect.  No abnormal extra-axial fluid collections are identified.  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  Multiple lacunar infarcts and chronic small vessel disease are again demonstrated as well as an old left cerebellar infarct.  Moderate cerebral atrophy is also stable. Heavy intracranial atherosclerotic calcification is noted.  No evidence of skull fracture or  other bone abnormality.  IMPRESSION:  1.  No acute intracranial abnormality. 2.  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  CT CERVICAL SPINE  Findings: No evidence of cervical spine fracture or subluxation. Severe degenerative disc disease is seen at C4-5 and C6-7.  Mild facet DJD is seen bilaterally.  Atlantoaxial degenerative changes also noted.  IMPRESSION:  1.  No evidence of cervical spine fracture or subluxation. 2.  Degenerative cervical spondylosis.  Original Report Authenticated By: Danae Orleans, M.D.   Ct Cervical Spine Wo Contrast  07/28/2011  *RADIOLOGY REPORT*  Clinical Data:  Multiple falls.  Head and neck injury.  Altered mental status. Colon cancer.  CT HEAD WITHOUT CONTRAST CT CERVICAL SPINE WITHOUT CONTRAST  Technique:  Multidetector CT imaging of the head and cervical spine was performed following the standard protocol without intravenous contrast.  Multiplanar CT image reconstructions of the cervical spine were also generated.  Comparison:  Head CT on 11/19/2008  CT HEAD  Findings: There is no evidence of intracranial hemorrhage, brain edema or other signs of acute infarction.  There is no evidence of intracranial mass lesion  or mass effect.  No abnormal extra-axial fluid collections are identified.  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  Multiple lacunar infarcts and chronic small vessel disease are again demonstrated as well as an old left cerebellar infarct.  Moderate cerebral atrophy is also stable. Heavy intracranial atherosclerotic calcification is noted.  No evidence of skull fracture or other bone abnormality.  IMPRESSION:  1.  No acute intracranial abnormality. 2.  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  CT CERVICAL SPINE  Findings: No evidence of cervical spine  fracture or subluxation. Severe degenerative disc disease is seen at C4-5 and C6-7.  Mild facet DJD is seen bilaterally.  Atlantoaxial degenerative changes also noted.  IMPRESSION:  1.  No evidence of cervical spine fracture or subluxation. 2.  Degenerative cervical spondylosis.  Original Report Authenticated By: Danae Orleans, M.D.   Medications: Scheduled Meds:   . allopurinol  100 mg Oral Daily  . amLODipine  10 mg Oral QPM  . aspirin EC  81 mg Oral Daily  . atenolol  50 mg Oral Daily  . atorvastatin  20 mg Oral QHS  . betaxolol  1 drop Both Eyes BID  . carbamazepine  100 mg Oral Daily  . carbamazepine  200 mg Oral QHS  . cloNIDine  0.2 mg Oral TID  . donepezil  10 mg Oral QHS  . enoxaparin (LOVENOX) injection  40 mg Subcutaneous Q24H  . FLUoxetine  20 mg Oral Daily  . insulin aspart  0-15 Units Subcutaneous TID WC  . insulin aspart  0-5 Units Subcutaneous QHS  . lisinopril  40 mg Oral Daily  . memantine  10 mg Oral BID  . methylPREDNISolone (SOLU-MEDROL) injection  60 mg Intravenous Q12H  . piperacillin-tazobactam (ZOSYN)  IV  3.375 g Intravenous Q8H  . risperiDONE  0.25 mg Oral BID  . senna-docusate  2 tablet Oral QHS  . sodium chloride  3 mL Intravenous Q12H  . tolterodine  4 mg Oral Daily  . Travoprost (BAK Free)  1 drop Both Eyes QHS  . vitamin C  500 mg Oral BID   Continuous Infusions:   . dextrose    . DISCONTD: dextrose 5 % and 0.45% NaCl 75 mL/hr at 07/29/11 1900   PRN Meds:.acetaminophen, albuterol, food thickener, ipratropium, LORazepam  Assessment/Plan:  LOS: 2 days  Hypernatremia  Improving.  - start IV fluids dextrose 5% @75  cc/hr  - check urin electrolytes  - follow up BMP Q12hr.  Acute kidney injury  - likely prerenal secondary to dehydration  - provide IV fluids, d5  @ 75 cc/hr  - follow up BMP in am  Aspiration pneumonia  vancomycin and zosyn per pharmacy  - speech and swallow evaluation recommended pureed diet with honey thick liq with full  assist.  - aspiration precaution  - nebulizers as needed for shortness of breath  - decrease  solumedrol 40 mg Q 12 hours (patient had some crackles on physical exam and audible wheezing)  Hypertension: controlled.  Dementia: on aricept and namenda  DVT Prophylaxis - lovenox sub q  Code Status - DNR  Nargis Abrams 07/30/2011, 4:50 PM

## 2011-07-30 NOTE — Evaluation (Signed)
Physical Therapy Evaluation Patient Details Name: [name] MRN: [id number] DOB: 06/07/1940 Today's Date: 07/30/2011  Problem List:  Patient Active Problem List  Diagnoses  . Hypernatremia  . Altered mental status  . Acute kidney injury  . Aspiration pneumonia    Past Medical History:  Past Medical History  Diagnosis Date  . Colon cancer   . Dementia   . CHF (congestive heart failure)   . Hypertension   . Hyperlipemia   . Diabetes mellitus   . History of CVA (cerebrovascular accident)   . History of colectomy   . Mild mental retardation    Past Surgical History:  Past Surgical History  Procedure Date  . Colectomy 08/2008    PT Assessment/Plan/Recommendation PT Assessment Clinical Impression Statement: Patient admitted with aspiration pneumonia and acute kidney injury with history of CVA's (lt. pariietal and lt. cerebellar) and current nursing home resident at [facility].  He will benefit from skilled PT during acute stay to address LE weakness, decreased mobility and limited activity tolerance with decrease balance. PT Recommendation/Assessment: Patient will need skilled PT in the acute care venue PT Problem List: Decreased strength;Decreased activity tolerance;Decreased balance;Decreased mobility PT Therapy Diagnosis : Generalized weakness PT Plan PT Frequency: Min 2X/week PT Treatment/Interventions: DME instruction;Functional mobility training;Therapeutic activities;Therapeutic exercise;Balance training;Patient/family education;Wheelchair mobility training PT Recommendation Follow Up Recommendations: Skilled nursing facility Equipment Recommended: Defer to next venue PT Goals  Acute Rehab PT Goals PT Goal Formulation: Patient unable to participate in goal setting Time For Goal Achievement: 2 weeks Pt will Roll Supine to Right Side: with min assist;with rail;with cues (comment type and amount) PT Goal: Rolling Supine to Right Side - Progress: Goal set  today Pt will Roll Supine to Left Side: with min assist;with rail;with cues (comment type and amount) PT Goal: Rolling Supine to Left Side - Progress: Goal set today Pt will go Supine/Side to Sit: with mod assist;with rail;with cues (comment type and amount) PT Goal: Supine/Side to Sit - Progress: Goal set today Pt will go Sit to Stand: with mod assist;with upper extremity assist PT Goal: Sit to Stand - Progress: Goal set today Pt will go Stand to Sit: with supervision PT Goal: Stand to Sit - Progress: Goal set today Pt will Transfer Bed to Chair/Chair to Bed: with min assist PT Transfer Goal: Bed to Chair/Chair to Bed - Progress: Goal set today Pt will Propel Wheelchair: 10 - 50 feet;with +2 total assist (pt=50%) PT Goal: Propel Wheelchair - Progress: Goal set today  PT Evaluation Precautions/Restrictions  Precautions Precautions: Fall Prior Functioning  Home Living Type of Home: Skilled Nursing Facility (from [facility]) Additional Comments: patient unable to state home enviornment and no family available Prior Function Comments: patient unable to state prior level of function Cognition Cognition Arousal/Alertness: Lethargic Overall Cognitive Status: History of cognitive impairments Cognition - Other Comments: asked pt what hospital he was in, stated "[facility]", could not state  nursing facility he was from Sensation/Coordination   Extremity Assessment RLE Assessment RLE Assessment: Exceptions to [facility] RLE AROM (degrees) RLE Overall AROM Comments: AAROM WFL RLE Strength RLE Overall Strength Comments: hip flexion 2+/5; knee extension 4-/5; flexion 3-/5, ankle dorsiflexion 3+/5 LLE Assessment LLE Assessment: Exceptions to WFL LLE AROM (degrees) LLE Overall AROM Comments: AAROM WFL LLE Strength LLE Overall Strength Comments: hip flexion 2+/5; knee extension 4-/5; flexion 3-/5, ankle dorsiflexion 3+/5 Mobility (including Balance) Bed Mobility Bed Mobility: Yes Supine to Sit:  1: +2 Total assist Supine to Sit Details (indicate cue  type and reason): pt=30%; assist for feet over to edge and to lift shoulders  Sit to Supine: 1: +2 Total assist Sit to Supine - Details (indicate cue type and reason): pt=15%; assist for feet into bed and to lower shoulders  Transfers Transfers: Yes Sit to Stand: 1: +2 Total assist;From bed;With upper extremity assist;From elevated surface Sit to Stand Details (indicate cue type and reason): pt=40% increased time required and cues for upright posture into standing; performed x 3 reps for strength/mobility Stand to Sit: 1: +2 Total assist;To bed Stand to Sit Details: pt=50% cues for controlled lowering Ambulation/Gait Ambulation/Gait: Yes Ambulation/Gait Assistance: 1: +2 Total assist Ambulation/Gait Assistance Details (indicate cue type and reason): pt=50% short shuffling/scooting steps to side to get close to [facility], with RW and cues x 3 reps to get to [facility] Ambulation Distance (Feet): 2 Feet Assistive device: Rolling walker  Posture/Postural Control Posture/Postural Control: Postural limitations Postural Limitations: keeps head down; corrects momentarily with verbal and tactile cues, but falls back down after few seconds upright. Balance Balance Assessed: Yes Static Sitting Balance Static Sitting - Balance Support: Bilateral upper extremity supported;Feet supported Static Sitting - Level of Assistance: 4: Min assist;3: Mod assist Static Sitting - Comment/# of Minutes: x ~3 minutes with mod verbal cues for correcting posterior bias sitting edge of bed Exercise    End of Session PT - End of Session Equipment Utilized During Treatment: Gait belt Activity Tolerance: Patient limited by fatigue Patient left: in bed;with call bell in reach Nurse Communication: Mobility status for transfers General Behavior During Session: Lethargic Cognition: Impaired, at baseline  [facility] 07/30/2011, 2:46 PM

## 2011-07-31 LAB — BASIC METABOLIC PANEL
BUN: 36 mg/dL — ABNORMAL HIGH (ref 6–23)
BUN: 34 mg/dL — ABNORMAL HIGH (ref 6–23)
CO2: 27 mEq/L (ref 19–32)
Calcium: 7.8 mg/dL — ABNORMAL LOW (ref 8.4–10.5)
Calcium: 7.9 mg/dL — ABNORMAL LOW (ref 8.4–10.5)
Chloride: 120 mEq/L — ABNORMAL HIGH (ref 96–112)
Chloride: 121 mEq/L — ABNORMAL HIGH (ref 96–112)
Creatinine, Ser: 1.63 mg/dL — ABNORMAL HIGH (ref 0.50–1.35)
Creatinine, Ser: 1.74 mg/dL — ABNORMAL HIGH (ref 0.50–1.35)
Creatinine, Ser: 1.75 mg/dL — ABNORMAL HIGH (ref 0.50–1.35)
GFR calc Af Amer: 47 mL/min — ABNORMAL LOW (ref >90)
GFR calc Af Amer: 43 mL/min — ABNORMAL LOW (ref >90)
GFR calc non Af Amer: 41 mL/min — ABNORMAL LOW (ref >90)
GFR calc non Af Amer: 37 mL/min — ABNORMAL LOW (ref >90)
Glucose, Bld: 327 mg/dL — ABNORMAL HIGH (ref 70–99)
Sodium: 155 mEq/L — ABNORMAL HIGH (ref 135–145)

## 2011-07-31 LAB — GLUCOSE, CAPILLARY
Glucose-Capillary: 283 mg/dL — ABNORMAL HIGH (ref 70–99)
Glucose-Capillary: 309 mg/dL — ABNORMAL HIGH (ref 70–99)

## 2011-07-31 MED ORDER — HYDRALAZINE HCL 20 MG/ML IJ SOLN
10.0000 mg | Freq: Three times a day (TID) | INTRAMUSCULAR | Status: DC | PRN
  Administered 2011-07-31 – 2011-08-01 (×2): 10 mg via INTRAVENOUS
  Filled 2011-07-31 (×2): qty 1

## 2011-07-31 MED ORDER — PREDNISONE 20 MG PO TABS
40.0000 mg | ORAL_TABLET | Freq: Every day | ORAL | Status: DC
  Administered 2011-08-01 – 2011-08-03 (×3): 40 mg via ORAL
  Filled 2011-07-31 (×3): qty 2

## 2011-07-31 MED ORDER — INSULIN ASPART 100 UNIT/ML ~~LOC~~ SOLN
3.0000 [IU] | Freq: Three times a day (TID) | SUBCUTANEOUS | Status: DC
  Administered 2011-07-31 – 2011-08-01 (×4): 3 [IU] via SUBCUTANEOUS

## 2011-07-31 MED ORDER — INSULIN ASPART 100 UNIT/ML ~~LOC~~ SOLN
0.0000 [IU] | Freq: Three times a day (TID) | SUBCUTANEOUS | Status: DC
  Administered 2011-07-31: 20 [IU] via SUBCUTANEOUS
  Administered 2011-08-01 (×2): 7 [IU] via SUBCUTANEOUS
  Administered 2011-08-01 – 2011-08-02 (×2): 11 [IU] via SUBCUTANEOUS
  Administered 2011-08-02: 5 [IU] via SUBCUTANEOUS
  Administered 2011-08-02: 15 [IU] via SUBCUTANEOUS
  Administered 2011-08-03: 20 [IU] via SUBCUTANEOUS
  Administered 2011-08-03: 15 [IU] via SUBCUTANEOUS
  Administered 2011-08-03: 11 [IU] via SUBCUTANEOUS
  Administered 2011-08-04: 20 [IU] via SUBCUTANEOUS
  Administered 2011-08-04: 7 [IU] via SUBCUTANEOUS
  Administered 2011-08-04 – 2011-08-05 (×2): 15 [IU] via SUBCUTANEOUS
  Administered 2011-08-05: 3 [IU] via SUBCUTANEOUS
  Administered 2011-08-05 – 2011-08-06 (×3): 15 [IU] via SUBCUTANEOUS
  Administered 2011-08-06: 4 [IU] via SUBCUTANEOUS
  Administered 2011-08-06: 11 [IU] via SUBCUTANEOUS
  Administered 2011-08-07 (×2): 7 [IU] via SUBCUTANEOUS

## 2011-07-31 NOTE — Progress Notes (Addendum)
Speech Language Pathology Dysphagia Treatment  Patient Details Name: [name] MRN: [id number] DOB: November 28, 1940 Today's Date: 07/31/2011 [id number]  SLP Assessment/Plan/Recommendation  Pt with much improved swallow function compared to evaluation date,  Improvement may be due to medical and mental status improvement.  No clinical s/s of aspiration with any po intake, delayed swallow noted.  Pt continues with dysphagia likely from multifactors including dementia and CVA.  Pt able to self feed with hand over hand assist by this SlP.  Cracker softened and provided with oral retention (left side anterior) without pt awareness.    If pt is consuming adequate po on modified solid diet (puree) currently, recommend continue to maximum airway protection and decr caregiver burden.    Pt may benefit from MBS (after SLP conversation with SNF SLP to help determine reasoning for dietary change.)  Rec consider advancing to allow nectar liquids pending MBS or further eval.  Pt did demonstrate incr exp wheeze with po - ? incr WOB.  Pt stated he has noted incr wheeze with exertion "as long as he has known".   Intake documented as 85% with lungs decreased and pt is afebrile.  CXR indicated minimal right basilar atelectasis.  Pt denies choking episode prior to admit, but acknowledges nausea and vomiting episodes for a few days prior to admission.  SLP ?s could pt have aspirated emesis, pt does not recall if he "choked" on vomitus.     CT spine:  Degenerative dx C4-C5, C6-C7, nothing acute.  CT HEad:  Nothing acute, Old parietal cva.   Swallowing Goals   SLP phoned and left message for SNF SLP to return call.  Pt with recent diet change to nectar at SNF and pt has not had previous instrumental eval at [facility]  Awaiting return call from SNF SLP.     SLP spoke to SNF therapist who reports pt had been off caseload since 01/28/2010 and has been receiving nectar thick liquids since that time.  SNF SLP requests pt  have MBS to instrumentally evaluate swallow ability to aid in determining readiness for dietary advancement to thin.  MD please order if you agree.   General  Pt at SNF, reports receives help to eat usually but will feed self if no one comes, "keep on going".    Oral Cavity - Oral Hygiene   Oral cavity appeared clean. Pt has dentures at facility but does not use them per his statement.   Chales Abrahams 07/31/2011, 2:00 PM  SLP spoke to Nurse tech, reported pt tolerated po diet well except eggs with pt demonstrating oral pocketing.

## 2011-07-31 NOTE — Progress Notes (Signed)
Patient admitted from [facility] for long term care. CSW left message for patient's sister, [name] (ph#: [phone]) to confirm that the plan is for him to return there at discharge. FL2 completed.   Unice Bailey, LCSWA [phone]

## 2011-07-31 NOTE — Progress Notes (Signed)
Subjective: No new complaints.   Objective: Weight change: 0.7 kg (1 lb 8.7 oz)  Intake/Output Summary (Last 24 hours) at 07/31/11 1529 Last data filed at 07/31/11 1300  Gross per 24 hour  Intake 3008.75 ml  Output   1825 ml  Net 1183.75 ml    Filed Vitals:   07/31/11 1422  BP: 171/102  Pulse: 77  Temp: 97.9 F (36.6 C)  Resp: 18   On exam he is comfortable and in no acute distress.  CVS S1S2 HEARD  Lungs scattered exp wheezing.  Abdomen" soft non tender bowel sounds heard  Extremities: no pedal edema.    Lab Results: Results for orders placed during the hospital encounter of 07/28/11 (from the past 24 hour(s))  GLUCOSE, CAPILLARY     Status: Abnormal   Collection Time   07/30/11  9:00 PM      Component Value Range   Glucose-Capillary 384 (*) 70 - 99 (mg/dL)  BASIC METABOLIC PANEL     Status: Abnormal   Collection Time   07/30/11 11:45 PM      Component Value Range   Sodium 155 (*) 135 - 145 (mEq/L)   Potassium 3.9  3.5 - 5.1 (mEq/L)   Chloride 121 (*) 96 - 112 (mEq/L)   CO2 27  19 - 32 (mEq/L)   Glucose, Bld 327 (*) 70 - 99 (mg/dL)   BUN 39 (*) 6 - 23 (mg/dL)   Creatinine, Ser 2.13 (*) 0.50 - 1.35 (mg/dL)   Calcium 7.9 (*) 8.4 - 10.5 (mg/dL)   GFR calc non Af Amer 38 (*) >90 (mL/min)   GFR calc Af Amer 44 (*) >90 (mL/min)  GLUCOSE, CAPILLARY     Status: Abnormal   Collection Time   07/31/11  8:12 AM      Component Value Range   Glucose-Capillary 283 (*) 70 - 99 (mg/dL)  BASIC METABOLIC PANEL     Status: Abnormal   Collection Time   07/31/11 11:00 AM      Component Value Range   Sodium 152 (*) 135 - 145 (mEq/L)   Potassium 3.5  3.5 - 5.1 (mEq/L)   Chloride 118 (*) 96 - 112 (mEq/L)   CO2 27  19 - 32 (mEq/L)   Glucose, Bld 420 (*) 70 - 99 (mg/dL)   BUN 36 (*) 6 - 23 (mg/dL)   Creatinine, Ser 0.86 (*) 0.50 - 1.35 (mg/dL)   Calcium 7.8 (*) 8.4 - 10.5 (mg/dL)   GFR calc non Af Amer 41 (*) >90 (mL/min)   GFR calc Af Amer 47 (*) >90 (mL/min)  GLUCOSE,  CAPILLARY     Status: Abnormal   Collection Time   07/31/11 12:15 PM      Component Value Range   Glucose-Capillary 309 (*) 70 - 99 (mg/dL)  GLUCOSE, CAPILLARY     Status: Abnormal   Collection Time   07/31/11  5:05 PM      Component Value Range   Glucose-Capillary 361 (*) 70 - 99 (mg/dL)     Micro Results: Recent Results (from the past 240 hour(s))  CULTURE, BLOOD (ROUTINE X 2)     Status: Normal (Preliminary result)   Collection Time   07/28/11 10:40 PM      Component Value Range Status Comment   Specimen Description BLOOD LEFT ANTECUBITAL   Final    Special Requests     Final    Value: BOTTLES DRAWN AEROBIC AND ANAEROBIC AERO 6CC ANA 4 CC   Culture  Setup  Time [id number]   Final    Culture     Final    Value:        BLOOD CULTURE RECEIVED NO GROWTH TO DATE CULTURE WILL BE HELD FOR 5 DAYS BEFORE ISSUING A FINAL NEGATIVE REPORT   Report Status PENDING   Incomplete   CULTURE, BLOOD (ROUTINE X 2)     Status: Normal (Preliminary result)   Collection Time   07/28/11 10:45 PM      Component Value Range Status Comment   Specimen Description BLOOD LEFT HAND   Final    Special Requests     Final    Value: BOTTLES DRAWN AEROBIC AND ANAEROBIC AERO 6 CC ANA 4 CC   Culture  Setup Time [id number]   Final    Culture     Final    Value:        BLOOD CULTURE RECEIVED NO GROWTH TO DATE CULTURE WILL BE HELD FOR 5 DAYS BEFORE ISSUING A FINAL NEGATIVE REPORT   Report Status PENDING   Incomplete   URINE CULTURE     Status: Normal   Collection Time   07/28/11 11:23 PM      Component Value Range Status Comment   Specimen Description URINE, CATHETERIZED   Final    Special Requests NONE   Final    Culture  Setup Time [id number]   Final    Colony Count NO GROWTH   Final    Culture NO GROWTH   Final    Report Status 07/30/2011 FINAL   Final   MRSA PCR SCREENING     Status: Normal   Collection Time   07/29/11  2:18 AM      Component Value Range Status Comment   MRSA by PCR NEGATIVE  NEGATIVE   Final     Studies/Results: Dg Chest 1 View  07/28/2011  *RADIOLOGY REPORT*  Clinical Data: Fever, altered mental status, possible fall, history carcinoma of the colon, dementia, hypertension, diabetes, CHF, stroke  CHEST - 1 VIEW  Comparison: 08/15/2008  Findings: Minimal enlargement of cardiac silhouette. Tortuous aorta. Prior vascular congestion. Minimal right basilar atelectasis. Slight accentuation of perihilar interstitial markings since previous exam could reflect minimal failure. No segmental consolidation or pleural effusion. No pneumothorax. Bones unremarkable.  IMPRESSION: Enlargement of cardiac silhouette with pulmonary vascular congestion. Minimal right basilar atelectasis. Cannot exclude minimal failure.  Original Report Authenticated By: Lollie Marrow, M.D.   Dg Pelvis 1-2 Views  07/28/2011  *RADIOLOGY REPORT*  Clinical Data: Altered mental status  PELVIS - 1-2 VIEW  Comparison: None  Findings: Osseous demineralization. Minimal narrowing of hip joints bilaterally. SI joints symmetric preserved. No acute fracture, dislocation, or bone destruction. Scattered atherosclerotic calcifications of numerous pelvic phleboliths. Mild gaseous distention of colon through the rectum with a stool ball in rectum.  IMPRESSION: Osseous demineralization with minimal degenerative changes of the hip joints. No acute bony abnormalities.  Original Report Authenticated By: Lollie Marrow, M.D.   Dg Shoulder Right  07/28/2011  *RADIOLOGY REPORT*  Clinical Data: Fever, possible fall, altered mental status  RIGHT SHOULDER - 2+ VIEW  Comparison: None  Findings: Osseous demineralization. AC joint alignment normal. No acute fracture, dislocation, or bone destruction. Visualized right ribs intact.  IMPRESSION: No acute bony abnormalities.  Original Report Authenticated By: Lollie Marrow, M.D.   Ct Head Wo Contrast  07/28/2011  *RADIOLOGY REPORT*  Clinical Data:  Multiple falls.  Head and neck injury.  Altered mental  status. Colon cancer.  CT HEAD WITHOUT CONTRAST CT CERVICAL SPINE WITHOUT CONTRAST  Technique:  Multidetector CT imaging of the head and cervical spine was performed following the standard protocol without intravenous contrast.  Multiplanar CT image reconstructions of the cervical spine were also generated.  Comparison:  Head CT on 11/19/2008  CT HEAD  Findings: There is no evidence of intracranial hemorrhage, brain edema or other signs of acute infarction.  There is no evidence of intracranial mass lesion or mass effect.  No abnormal extra-axial fluid collections are identified.  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  Multiple lacunar infarcts and chronic small vessel disease are again demonstrated as well as an old left cerebellar infarct.  Moderate cerebral atrophy is also stable. Heavy intracranial atherosclerotic calcification is noted.  No evidence of skull fracture or other bone abnormality.  IMPRESSION:  1.  No acute intracranial abnormality. 2.  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  CT CERVICAL SPINE  Findings: No evidence of cervical spine fracture or subluxation. Severe degenerative disc disease is seen at C4-5 and C6-7.  Mild facet DJD is seen bilaterally.  Atlantoaxial degenerative changes also noted.  IMPRESSION:  1.  No evidence of cervical spine fracture or subluxation. 2.  Degenerative cervical spondylosis.  Original Report Authenticated By: Danae Orleans, M.D.   Ct Cervical Spine Wo Contrast  07/28/2011  *RADIOLOGY REPORT*  Clinical Data:  Multiple falls.  Head and neck injury.  Altered mental status. Colon cancer.  CT HEAD WITHOUT CONTRAST CT CERVICAL SPINE WITHOUT CONTRAST  Technique:  Multidetector CT imaging of the head and cervical spine was performed following the standard protocol without intravenous contrast.  Multiplanar CT image reconstructions  of the cervical spine were also generated.  Comparison:  Head CT on 11/19/2008  CT HEAD  Findings: There is no evidence of intracranial hemorrhage, brain edema or other signs of acute infarction.  There is no evidence of intracranial mass lesion or mass effect.  No abnormal extra-axial fluid collections are identified.  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  Multiple lacunar infarcts and chronic small vessel disease are again demonstrated as well as an old left cerebellar infarct.  Moderate cerebral atrophy is also stable. Heavy intracranial atherosclerotic calcification is noted.  No evidence of skull fracture or other bone abnormality.  IMPRESSION:  1.  No acute intracranial abnormality. 2.  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  CT CERVICAL SPINE  Findings: No evidence of cervical spine fracture or subluxation. Severe degenerative disc disease is seen at C4-5 and C6-7.  Mild facet DJD is seen bilaterally.  Atlantoaxial degenerative changes also noted.  IMPRESSION:  1.  No evidence of cervical spine fracture or subluxation. 2.  Degenerative cervical spondylosis.  Original Report Authenticated By: Danae Orleans, M.D.   Medications: Scheduled Meds:   . allopurinol  100 mg Oral Daily  . amLODipine  10 mg Oral QPM  . aspirin EC  81 mg Oral Daily  . atenolol  50 mg Oral Daily  . atorvastatin  20 mg Oral QHS  . betaxolol  1 drop Both Eyes BID  . carbamazepine  100 mg Oral Daily  . carbamazepine  200 mg Oral QHS  . cloNIDine  0.2 mg Oral TID  . donepezil  10 mg Oral QHS  .  enoxaparin (LOVENOX) injection  40 mg Subcutaneous Q24H  . FLUoxetine  20 mg Oral Daily  . insulin aspart  0-20 Units Subcutaneous TID WC  . insulin aspart  0-5 Units Subcutaneous QHS  . insulin aspart  3 Units Subcutaneous TID WC  . lisinopril  40 mg Oral Daily  . memantine  10 mg Oral BID    . methylPREDNISolone (SOLU-MEDROL) injection  40 mg Intravenous Q12H  . piperacillin-tazobactam (ZOSYN)  IV  3.375 g Intravenous Q8H  . risperiDONE  0.25 mg Oral BID  . senna-docusate  2 tablet Oral QHS  . sodium chloride  3 mL Intravenous Q12H  . tolterodine  4 mg Oral Daily  . Travoprost (BAK Free)  1 drop Both Eyes QHS  . vitamin C  500 mg Oral BID  . DISCONTD: insulin aspart  0-15 Units Subcutaneous TID WC  . DISCONTD: methylPREDNISolone (SOLU-MEDROL) injection  60 mg Intravenous Q12H   Continuous Infusions:   . dextrose 75 mL/hr at 07/30/11 1900  . DISCONTD: dextrose 5 % and 0.45% NaCl 75 mL/hr at 07/29/11 1900   PRN Meds:.acetaminophen, albuterol, food thickener, ipratropium, LORazepam  Assessment/Plan: Patient Active Hospital Problem List: Hypernatremia  Improving.  - start IV fluids dextrose 5% @75  cc/hr  - check urin electrolytes  - follow up BMP Q12hr.  Acute kidney injury  - likely prerenal secondary to dehydration  - provide IV fluids, d5 @ 75 cc/hr  - follow up BMP in am  Aspiration pneumonia  vancomycin and zosyn per pharmacy  - speech and swallow evaluation recommended pureed diet with honey thick liq with full assist.  - aspiration precaution  AND mbs - nebulizers as needed for shortness of breath  - taper steroids Hypertension:  Not well controlled. Started on iv hydralazine. Dementia: on aricept and namenda  DVT Prophylaxis - lovenox sub q  Code Status - DNR      LOS: 3 days   [name] 07/31/2011, 3:29 PM

## 2011-07-31 NOTE — Clinical Documentation Improvement (Signed)
CHANGE MENTAL STATUS DOCUMENTATION CLARIFICATION   THIS DOCUMENT IS NOT A PERMANENT PART OF THE MEDICAL RECORD  TO RESPOND TO THE THIS QUERY, FOLLOW THE INSTRUCTIONS BELOW:  1. If needed, update documentation for the patient's encounter via the notes activity.  2. Access this query again and click edit on the In [employer].  3. After updating, or not, click F2 to complete all highlighted (required) fields concerning your review. Select "additional documentation in the medical record" OR "no additional documentation provided".  4. Click Sign note button.  5. The deficiency will fall out of your In Basket *Please let us know if you are not able to complete this workflow by phone or e-mail (listed below).         07/31/11  Dear Dr.  Blake Divine, Seth Bake Marton Redwood  In an effort to better capture your patient's severity of illness, reflect appropriate length of stay and utilization of resources, a review of the patient medical record has revealed the following indicators.    Based on your clinical judgment, please clarify and document in a progress note and/or discharge summary the clinical condition associated with the following supporting information:  In responding to this query please exercise your independent judgment.  The fact that a query is asked, does not imply that any particular answer is desired or expected.  Pt admitted with Altered Mental Status   Please clarify if AMS in setting of Asp PNA and H/O Dementia can be further specified as one of the diagnosis listed below and document in pn or d/c summary.     Possible Clinical Conditions?  _______Encephalopathy (describe type if known)                       Anoxic                       Septic                       Alcoholic                        Hepatic                       Hypertensive                       Metabolic                       _______ Toxic ________Other Condition__________________ _______Cannot Clinically  Determine   Supporting Information:    Risk Factors: AMS, demenita, Asp pna, hyponatremia, AKI, HTN,  Signs & Symptoms: ED physician. As per ED MD patient appeared to be increasingly confused over last few days   Diagnostics: IMPRESSION: Enlargement of cardiac silhouette with pulmonary vascular congestion.  Treatment: piperacillin-tazobactam (ZOSYN) IVPB 3.375 carbamazepine (TEGRETOL) chewable tablet 100 mg       Reviewed: additional documentation in the medical record ljh  Thank You,  Enis Slipper  RN, BSN, CCDS Clinical Documentation Specialist [facility] Dept Pager: [phone] / E-mail: [email or web address]  Health Information Management [place]

## 2011-07-31 NOTE — Progress Notes (Signed)
Inpatient Diabetes Program Recommendations  AACE/ADA: New Consensus Statement on Inpatient Glycemic Control (2009)  Target Ranges:  Prepandial:   less than 140 mg/dL      Peak postprandial:   less than 180 mg/dL (1-2 hours)      Critically ill patients:  140 - 180 mg/dL   Reason for Visit: Hyperglycemia Results for [name], [name] (MRN [id number]) as of 07/31/2011 16:08  Ref. Range 07/30/2011 11:27 07/30/2011 16:53 07/30/2011 21:00 07/31/2011 08:12 07/31/2011 12:15  Glucose-Capillary Latest Range: 70-99 mg/dL 914 (H) 782 (H) 956 (H) 283 (H) 309 (H)   Results for TIMM, BONENBERGER (MRN [id number]) as of 07/31/2011 16:08  Ref. Range 07/29/2011 01:25  Hemoglobin A1C Latest Range: <5.7 % 6.3 (H)    Inpatient Diabetes Program Recommendations Insulin - Basal: Needs basal insulin - Pt on Lantus 70 units QAM and 65 units QHS at nursing home  Note: Will follow.

## 2011-08-01 ENCOUNTER — Inpatient Hospital Stay (HOSPITAL_COMMUNITY): Payer: Medicare Other

## 2011-08-01 LAB — GLUCOSE, CAPILLARY
Glucose-Capillary: 244 mg/dL — ABNORMAL HIGH (ref 70–99)
Glucose-Capillary: 237 mg/dL — ABNORMAL HIGH (ref 70–99)
Glucose-Capillary: 296 mg/dL — ABNORMAL HIGH (ref 70–99)

## 2011-08-01 LAB — BASIC METABOLIC PANEL
CO2: 26 mEq/L (ref 19–32)
CO2: 28 mEq/L (ref 19–32)
Calcium: 7.8 mg/dL — ABNORMAL LOW (ref 8.4–10.5)
Chloride: 123 mEq/L — ABNORMAL HIGH (ref 96–112)
Chloride: 123 mEq/L — ABNORMAL HIGH (ref 96–112)
GFR calc Af Amer: 40 mL/min — ABNORMAL LOW (ref >90)
Glucose, Bld: 297 mg/dL — ABNORMAL HIGH (ref 70–99)
Potassium: 3.3 mEq/L — ABNORMAL LOW (ref 3.5–5.1)
Potassium: 3.5 mEq/L (ref 3.5–5.1)
Sodium: 157 mEq/L — ABNORMAL HIGH (ref 135–145)
Sodium: 158 mEq/L — ABNORMAL HIGH (ref 135–145)

## 2011-08-01 MED ORDER — HYDRALAZINE HCL 50 MG PO TABS
50.0000 mg | ORAL_TABLET | Freq: Three times a day (TID) | ORAL | Status: DC
  Administered 2011-08-01 – 2011-08-03 (×7): 50 mg via ORAL
  Filled 2011-08-01 (×9): qty 1

## 2011-08-01 MED ORDER — VANCOMYCIN HCL 1000 MG IV SOLR
750.0000 mg | Freq: Two times a day (BID) | INTRAVENOUS | Status: DC
  Administered 2011-08-01 – 2011-08-03 (×5): 750 mg via INTRAVENOUS
  Filled 2011-08-01 (×6): qty 750

## 2011-08-01 MED ORDER — INSULIN GLARGINE 100 UNIT/ML ~~LOC~~ SOLN
10.0000 [IU] | Freq: Every day | SUBCUTANEOUS | Status: DC
  Administered 2011-08-01 – 2011-08-02 (×2): 10 [IU] via SUBCUTANEOUS

## 2011-08-01 MED ORDER — DEXTROSE-NACL 5-0.45 % IV SOLN
INTRAVENOUS | Status: DC
  Administered 2011-08-01 (×2): via INTRAVENOUS

## 2011-08-01 MED ORDER — POTASSIUM CHLORIDE CRYS ER 20 MEQ PO TBCR
40.0000 meq | EXTENDED_RELEASE_TABLET | Freq: Two times a day (BID) | ORAL | Status: AC
  Administered 2011-08-01 (×2): 40 meq via ORAL
  Filled 2011-08-01 (×2): qty 2

## 2011-08-01 MED ORDER — INSULIN ASPART 100 UNIT/ML ~~LOC~~ SOLN
5.0000 [IU] | Freq: Three times a day (TID) | SUBCUTANEOUS | Status: DC
  Administered 2011-08-02 (×2): 5 [IU] via SUBCUTANEOUS
  Administered 2011-08-02: 09:00:00 via SUBCUTANEOUS
  Administered 2011-08-03 – 2011-08-06 (×12): 5 [IU] via SUBCUTANEOUS
  Administered 2011-08-07: 09:00:00 via SUBCUTANEOUS

## 2011-08-01 NOTE — Progress Notes (Signed)
ANTIBIOTIC CONSULT NOTE - Follow Up  Pharmacy Consult for Vancomycin and Zosyn  Indication: PNA  No Known Allergies  Patient Measurements: Height: 6' (182.9 cm) Weight: 216 lb 0.8 oz (98 kg) IBW/kg (Calculated) : 77.6   Vital Signs: Temp: 100 F (37.8 C) (03/26 0617) Temp src: Axillary (03/26 0617) BP: 190/109 mmHg (03/26 0617) Pulse Rate: 84  (03/26 0617) Intake/Output from previous day: 03/25 0701 - 03/26 0700 In: 1035 [P.O.:360; I.V.:525; IV Piggyback:150] Out: 1850 [Urine:1850] Intake/Output from this shift: Total I/O In: 40 [P.O.:40] Out: -   Labs:  Basename 07/31/11 2320 07/31/11 1100 07/30/11 2345 07/30/11 0944 07/30/11 0550  WBC -- -- -- -- 8.8  HGB -- -- -- -- 12.0*  PLT -- -- -- -- 288  LABCREA -- -- -- 128.3 --  CREATININE 1.75* 1.63* 1.74* -- --   Estimated Creatinine Clearance: 47 ml/min (by C-G formula based on Cr of 1.75). No results found for this basename: VANCOTROUGH:2,VANCOPEAK:2,VANCORANDOM:2,GENTTROUGH:2,GENTPEAK:2,GENTRANDOM:2,TOBRATROUGH:2,TOBRAPEAK:2,TOBRARND:2,AMIKACINPEAK:2,AMIKACINTROU:2,AMIKACIN:2, in the last 72 hours   Microbiology: Recent Results (from the past 720 hour(s))  CULTURE, BLOOD (ROUTINE X 2)     Status: Normal (Preliminary result)   Collection Time   07/28/11 10:40 PM      Component Value Range Status Comment   Specimen Description BLOOD LEFT ANTECUBITAL   Final    Special Requests     Final    Value: BOTTLES DRAWN AEROBIC AND ANAEROBIC AERO 6CC ANA 4 CC   Culture  Setup Time [id number]   Final    Culture     Final    Value:        BLOOD CULTURE RECEIVED NO GROWTH TO DATE CULTURE WILL BE HELD FOR 5 DAYS BEFORE ISSUING A FINAL NEGATIVE REPORT   Report Status PENDING   Incomplete   CULTURE, BLOOD (ROUTINE X 2)     Status: Normal (Preliminary result)   Collection Time   07/28/11 10:45 PM      Component Value Range Status Comment   Specimen Description BLOOD LEFT HAND   Final    Special Requests     Final    Value:  BOTTLES DRAWN AEROBIC AND ANAEROBIC AERO 6 CC ANA 4 CC   Culture  Setup Time [id number]   Final    Culture     Final    Value:        BLOOD CULTURE RECEIVED NO GROWTH TO DATE CULTURE WILL BE HELD FOR 5 DAYS BEFORE ISSUING A FINAL NEGATIVE REPORT   Report Status PENDING   Incomplete   URINE CULTURE     Status: Normal   Collection Time   07/28/11 11:23 PM      Component Value Range Status Comment   Specimen Description URINE, CATHETERIZED   Final    Special Requests NONE   Final    Culture  Setup Time [id number]   Final    Colony Count NO GROWTH   Final    Culture NO GROWTH   Final    Report Status 07/30/2011 FINAL   Final   MRSA PCR SCREENING     Status: Normal   Collection Time   07/29/11  2:18 AM      Component Value Range Status Comment   MRSA by PCR NEGATIVE  NEGATIVE  Final     Medical History: Past Medical History  Diagnosis Date  . Colon cancer   . Dementia   . CHF (congestive heart failure)   . Hypertension   . Hyperlipemia   .  Diabetes mellitus   . History of CVA (cerebrovascular accident)   . History of colectomy   . Mild mental retardation     Medications:  Anti-infectives     Start     Dose/Rate Route Frequency Ordered Stop   08/01/11 1100   vancomycin (VANCOCIN) 750 mg in sodium chloride 0.9 % 150 mL IVPB        750 mg 150 mL/hr over 60 Minutes Intravenous Every 12 hours 08/01/11 1026     07/29/11 1000   vancomycin (VANCOCIN) 750 mg in sodium chloride 0.9 % 150 mL IVPB        750 mg 150 mL/hr over 60 Minutes Intravenous  Once 07/29/11 0359 07/29/11 1256   07/29/11 0600   piperacillin-tazobactam (ZOSYN) IVPB 3.375 g        3.375 g 12.5 mL/hr over 240 Minutes Intravenous 3 times per day 07/29/11 0124     07/29/11 0130   vancomycin (VANCOCIN) IVPB 1000 mg/200 mL premix  Status:  Discontinued        1,000 mg 200 mL/hr over 60 Minutes Intravenous  Once 07/29/11 0124 07/29/11 0238   07/28/11 2200   vancomycin (VANCOCIN) IVPB 1000 mg/200 mL premix          1,000 mg 200 mL/hr over 60 Minutes Intravenous  Once 07/28/11 2155 07/29/11 0035   07/28/11 2200   piperacillin-tazobactam (ZOSYN) IVPB 3.375 g        3.375 g 100 mL/hr over 30 Minutes Intravenous  Once 07/28/11 2155 07/28/11 2321         Assessment: 71yom on day #4 broad spectrum antibiotics for aspiration PNA.  After patient had received two one-time orders of vancomycin on admission, the scheduled vancomycin order was missed and patient did not receive vancomycin x2 days.  Cultures show no growth to date, WBC WNL, and patient has received 4 days of zosyn so far.  SCr elevated but stable (possibly patient's new baseline?).  Vancomycin will be restarted today.    Goal of Therapy:  Vancomycin trough level 15-20 mcg/ml Zosyn based on renal function   Plan:  Measure antibiotic drug levels at steady state Follow up culture results Vancomycin 750mg  iv q12hr Zosyn 3.375g IV Q8H infused over 4hrs.   Clance Boll 08/01/2011,10:35 AM

## 2011-08-01 NOTE — Progress Notes (Signed)
Speech Language Pathology Dysphagia Treatment  Patient Details Name: [name] MRN: [id number] DOB: Nov 08, 1940 Today's Date: 08/01/2011 [id number] SLP Assessment/Plan/Recommendation  Pt brought to xray for MBS, SLP saw pt at bedside to interview before MBS.  Pt asleep, opening eyes only briefly with verbal and tactile stimulation.  Pt was febrile earlier today and he now complains of being hot-RN informed.  Swallowing Goals   1. Goals not Met, Pt too lethargic to participate in MBS.  General Temperature Spikes Noted: Yes Respiratory Status: Supplemental O2 delivered via (comment) Behavior/Cognition: Alert;Cooperative;Pleasant mood;Confused;Distractible;Requires cueing;Decreased sustained attention Oral Cavity - Dentition: Edentulous Patient Positioning: Upright in chair  Oral Cavity - Oral Hygiene   Oral cavity full of secretions with appearance of stasis of grits,  SLP cleaned mouth using spoon and tissues.  Provided pt a few tsps of thin water to aid clearance, delayed swallow but no overt clinical s/s of aspiration noted.    Dysphagia Treatment   Defer MBS due to pt's mental status prohibiting accurate results.  Pt was fully alert yesterday.    Rec continue modified diet feeding pt only when fully alert.  Defer MBS until pt adequately alert.  RN advised to set up oral suction for pt.    [name], MS [facility] SLP [phone]

## 2011-08-01 NOTE — Progress Notes (Signed)
   CARE MANAGEMENT NOTE 08/01/2011  Patient:  [name], [name]   Account Number:  [id number]  Date Initiated:  08/01/2011  Documentation initiated by:  Lanier Clam  Subjective/Objective Assessment:   ADMITTED W/AMS,HYPERNATREMIA.     Action/Plan:   FROM SNF.   Anticipated DC Date:  08/08/2011   Anticipated DC Plan:  SKILLED NURSING FACILITY  In-house referral  Clinical Social Worker         Choice offered to / List presented to:             Status of service:  In process, will continue to follow Medicare Important Message given?   (If response is "NO", the following Medicare IM given date fields will be blank) Date Medicare IM given:   Date Additional Medicare IM given:    Discharge Disposition:    Per UR Regulation:  Reviewed for med. necessity/level of care/duration of stay  If discussed at Long Length of Stay Meetings, dates discussed:    Comments:  08/01/11 Roselyne Stalnaker RN,BSN NCM [phone] CSW FOLLOWING FOR SNF.

## 2011-08-01 NOTE — Progress Notes (Signed)
Subjective: Lethargic, but arousable.   Objective: Weight change: 0.7 kg (1 lb 8.7 oz)  Intake/Output Summary (Last 24 hours) at 08/01/11 2007 Last data filed at 08/01/11 1854  Gross per 24 hour  Intake    600 ml  Output    950 ml  Net   -350 ml    Filed Vitals:   08/01/11 1700  BP: 136/85  Pulse:   Temp: 98.9 F (37.2 C)  Resp:    On exam  he is lethargic but comfortable and in no acute distress.  CVS S1S2 HEARD  Lungs: decreased at bases, scattered exp wheezing.  Abdomen:  soft non tender bowel sounds heard  Extremities: no pedal edema.      Lab Results: Results for orders placed during the hospital encounter of 07/28/11 (from the past 24 hour(s))  GLUCOSE, CAPILLARY     Status: Abnormal   Collection Time   07/31/11  8:30 PM      Component Value Range   Glucose-Capillary 375 (*) 70 - 99 (mg/dL)  BASIC METABOLIC PANEL     Status: Abnormal   Collection Time   07/31/11 11:20 PM      Component Value Range   Sodium 154 (*) 135 - 145 (mEq/L)   Potassium 3.6  3.5 - 5.1 (mEq/L)   Chloride 120 (*) 96 - 112 (mEq/L)   CO2 27  19 - 32 (mEq/L)   Glucose, Bld 348 (*) 70 - 99 (mg/dL)   BUN 34 (*) 6 - 23 (mg/dL)   Creatinine, Ser 8.11 (*) 0.50 - 1.35 (mg/dL)   Calcium 7.8 (*) 8.4 - 10.5 (mg/dL)   GFR calc non Af Amer 37 (*) >90 (mL/min)   GFR calc Af Amer 43 (*) >90 (mL/min)  GLUCOSE, CAPILLARY     Status: Abnormal   Collection Time   08/01/11  7:50 AM      Component Value Range   Glucose-Capillary 284 (*) 70 - 99 (mg/dL)  GLUCOSE, CAPILLARY     Status: Abnormal   Collection Time   08/01/11 11:50 AM      Component Value Range   Glucose-Capillary 244 (*) 70 - 99 (mg/dL)  BASIC METABOLIC PANEL     Status: Abnormal   Collection Time   08/01/11 11:54 AM      Component Value Range   Sodium 157 (*) 135 - 145 (mEq/L)   Potassium 3.3 (*) 3.5 - 5.1 (mEq/L)   Chloride 123 (*) 96 - 112 (mEq/L)   CO2 26  19 - 32 (mEq/L)   Glucose, Bld 297 (*) 70 - 99 (mg/dL)   BUN 33 (*) 6 -  23 (mg/dL)   Creatinine, Ser 9.14 (*) 0.50 - 1.35 (mg/dL)   Calcium 7.8 (*) 8.4 - 10.5 (mg/dL)   GFR calc non Af Amer 35 (*) >90 (mL/min)   GFR calc Af Amer 41 (*) >90 (mL/min)  GLUCOSE, CAPILLARY     Status: Abnormal   Collection Time   08/01/11  5:16 PM      Component Value Range   Glucose-Capillary 237 (*) 70 - 99 (mg/dL)  BASIC METABOLIC PANEL     Status: Abnormal   Collection Time   08/01/11  6:18 PM      Component Value Range   Sodium 158 (*) 135 - 145 (mEq/L)   Potassium 3.5  3.5 - 5.1 (mEq/L)   Chloride 123 (*) 96 - 112 (mEq/L)   CO2 28  19 - 32 (mEq/L)   Glucose, Bld 259 (*)  70 - 99 (mg/dL)   BUN 35 (*) 6 - 23 (mg/dL)   Creatinine, Ser 1.61 (*) 0.50 - 1.35 (mg/dL)   Calcium 8.2 (*) 8.4 - 10.5 (mg/dL)   GFR calc non Af Amer 35 (*) >90 (mL/min)   GFR calc Af Amer 40 (*) >90 (mL/min)     Micro Results: Recent Results (from the past 240 hour(s))  CULTURE, BLOOD (ROUTINE X 2)     Status: Normal (Preliminary result)   Collection Time   07/28/11 10:40 PM      Component Value Range Status Comment   Specimen Description BLOOD LEFT ANTECUBITAL   Final    Special Requests     Final    Value: BOTTLES DRAWN AEROBIC AND ANAEROBIC AERO [facility] ANA 4 CC   Culture  Setup Time [id number]   Final    Culture     Final    Value: GRAM POSITIVE RODS     Note: Gram Stain Report Called to,Read Back By and Verified With: [facility] REYNOLDS @ 1625 08/01/11 WICKN   Report Status PENDING   Incomplete   CULTURE, BLOOD (ROUTINE X 2)     Status: Normal (Preliminary result)   Collection Time   07/28/11 10:45 PM      Component Value Range Status Comment   Specimen Description BLOOD LEFT HAND   Final    Special Requests     Final    Value: BOTTLES DRAWN AEROBIC AND ANAEROBIC AERO 6 CC ANA 4 CC   Culture  Setup Time [id number]   Final    Culture     Final    Value:        BLOOD CULTURE RECEIVED NO GROWTH TO DATE CULTURE WILL BE HELD FOR 5 DAYS BEFORE ISSUING A FINAL NEGATIVE REPORT   Report Status  PENDING   Incomplete   URINE CULTURE     Status: Normal   Collection Time   07/28/11 11:23 PM      Component Value Range Status Comment   Specimen Description URINE, CATHETERIZED   Final    Special Requests NONE   Final    Culture  Setup Time [id number]   Final    Colony Count NO GROWTH   Final    Culture NO GROWTH   Final    Report Status 07/30/2011 FINAL   Final   MRSA PCR SCREENING     Status: Normal   Collection Time   07/29/11  2:18 AM      Component Value Range Status Comment   MRSA by PCR NEGATIVE  NEGATIVE  Final     Studies/Results: Dg Chest 1 View  07/28/2011  *RADIOLOGY REPORT*  Clinical Data: Fever, altered mental status, possible fall, history carcinoma of the colon, dementia, hypertension, diabetes, CHF, stroke  CHEST - 1 VIEW  Comparison: 08/15/2008  Findings: Minimal enlargement of cardiac silhouette. Tortuous aorta. Prior vascular congestion. Minimal right basilar atelectasis. Slight accentuation of perihilar interstitial markings since previous exam could reflect minimal failure. No segmental consolidation or pleural effusion. No pneumothorax. Bones unremarkable.  IMPRESSION: Enlargement of cardiac silhouette with pulmonary vascular congestion. Minimal right basilar atelectasis. Cannot exclude minimal failure.  Original Report Authenticated By: Lollie Marrow, M.D.   Dg Pelvis 1-2 Views  07/28/2011  *RADIOLOGY REPORT*  Clinical Data: Altered mental status  PELVIS - 1-2 VIEW  Comparison: None  Findings: Osseous demineralization. Minimal narrowing of hip joints bilaterally. SI joints symmetric preserved. No acute fracture, dislocation, or bone destruction. Scattered atherosclerotic calcifications  of numerous pelvic phleboliths. Mild gaseous distention of colon through the rectum with a stool ball in rectum.  IMPRESSION: Osseous demineralization with minimal degenerative changes of the hip joints. No acute bony abnormalities.  Original Report Authenticated By: Lollie Marrow, M.D.    Dg Shoulder Right  07/28/2011  *RADIOLOGY REPORT*  Clinical Data: Fever, possible fall, altered mental status  RIGHT SHOULDER - 2+ VIEW  Comparison: None  Findings: Osseous demineralization. AC joint alignment normal. No acute fracture, dislocation, or bone destruction. Visualized right ribs intact.  IMPRESSION: No acute bony abnormalities.  Original Report Authenticated By: Lollie Marrow, M.D.   Ct Head Wo Contrast  07/28/2011  *RADIOLOGY REPORT*  Clinical Data:  Multiple falls.  Head and neck injury.  Altered mental status. Colon cancer.  CT HEAD WITHOUT CONTRAST CT CERVICAL SPINE WITHOUT CONTRAST  Technique:  Multidetector CT imaging of the head and cervical spine was performed following the standard protocol without intravenous contrast.  Multiplanar CT image reconstructions of the cervical spine were also generated.  Comparison:  Head CT on 11/19/2008  CT HEAD  Findings: There is no evidence of intracranial hemorrhage, brain edema or other signs of acute infarction.  There is no evidence of intracranial mass lesion or mass effect.  No abnormal extra-axial fluid collections are identified.  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  Multiple lacunar infarcts and chronic small vessel disease are again demonstrated as well as an old left cerebellar infarct.  Moderate cerebral atrophy is also stable. Heavy intracranial atherosclerotic calcification is noted.  No evidence of skull fracture or other bone abnormality.  IMPRESSION:  1.  No acute intracranial abnormality. 2.  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  CT CERVICAL SPINE  Findings: No evidence of cervical spine fracture or subluxation. Severe degenerative disc disease is seen at C4-5 and C6-7.  Mild facet DJD is seen bilaterally.  Atlantoaxial degenerative changes also noted.  IMPRESSION:  1.  No evidence of cervical  spine fracture or subluxation. 2.  Degenerative cervical spondylosis.  Original Report Authenticated By: Danae Orleans, M.D.   Ct Cervical Spine Wo Contrast  07/28/2011  *RADIOLOGY REPORT*  Clinical Data:  Multiple falls.  Head and neck injury.  Altered mental status. Colon cancer.  CT HEAD WITHOUT CONTRAST CT CERVICAL SPINE WITHOUT CONTRAST  Technique:  Multidetector CT imaging of the head and cervical spine was performed following the standard protocol without intravenous contrast.  Multiplanar CT image reconstructions of the cervical spine were also generated.  Comparison:  Head CT on 11/19/2008  CT HEAD  Findings: There is no evidence of intracranial hemorrhage, brain edema or other signs of acute infarction.  There is no evidence of intracranial mass lesion or mass effect.  No abnormal extra-axial fluid collections are identified.  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  Multiple lacunar infarcts and chronic small vessel disease are again demonstrated as well as an old left cerebellar infarct.  Moderate cerebral atrophy is also stable. Heavy intracranial atherosclerotic calcification is noted.  No evidence of skull fracture or other bone abnormality.  IMPRESSION:  1.  No acute intracranial abnormality. 2.  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  CT CERVICAL SPINE  Findings: No evidence of cervical spine fracture or subluxation. Severe degenerative disc disease  is seen at C4-5 and C6-7.  Mild facet DJD is seen bilaterally.  Atlantoaxial degenerative changes also noted.  IMPRESSION:  1.  No evidence of cervical spine fracture or subluxation. 2.  Degenerative cervical spondylosis.  Original Report Authenticated By: Danae Orleans, M.D.   Medications: Scheduled Meds:   . allopurinol  100 mg Oral Daily  . amLODipine  10 mg Oral QPM  . aspirin EC  81 mg Oral Daily  .  atenolol  50 mg Oral Daily  . atorvastatin  20 mg Oral QHS  . betaxolol  1 drop Both Eyes BID  . carbamazepine  100 mg Oral Daily  . carbamazepine  200 mg Oral QHS  . cloNIDine  0.2 mg Oral TID  . donepezil  10 mg Oral QHS  . enoxaparin (LOVENOX) injection  40 mg Subcutaneous Q24H  . FLUoxetine  20 mg Oral Daily  . hydrALAZINE  50 mg Oral Q8H  . insulin aspart  0-20 Units Subcutaneous TID WC  . insulin aspart  0-5 Units Subcutaneous QHS  . insulin aspart  5 Units Subcutaneous TID WC  . insulin glargine  10 Units Subcutaneous QHS  . lisinopril  40 mg Oral Daily  . memantine  10 mg Oral BID  . piperacillin-tazobactam (ZOSYN)  IV  3.375 g Intravenous Q8H  . potassium chloride  40 mEq Oral BID  . predniSONE  40 mg Oral Q breakfast  . risperiDONE  0.25 mg Oral BID  . senna-docusate  2 tablet Oral QHS  . sodium chloride  3 mL Intravenous Q12H  . tolterodine  4 mg Oral Daily  . Travoprost (BAK Free)  1 drop Both Eyes QHS  . vancomycin  750 mg Intravenous Q12H  . vitamin C  500 mg Oral BID  . DISCONTD: insulin aspart  3 Units Subcutaneous TID WC   Continuous Infusions:   . dextrose 5 % and 0.45% NaCl 100 mL/hr at 08/01/11 1546   PRN Meds:.acetaminophen, albuterol, food thickener, hydrALAZINE, ipratropium, LORazepam    Initial Presentation:   71 year old male from NH with multiple co morbidities who presented with altered mental status as per family member.  On arrival he was found to have hypernatremia, acute renal failure and aspiration pneumonia. Started him on iv antibiotics and IV fluids.   Assessment/Plan:  Altered mental Status :  Waxing and  Waning for the last two days.  Probably secondary to metabolic encephalopathy secondary to Hypernatremia and Acute renal failure. CT head with no significant acute intracranial findings  Hypernatremia  Improving.  - start IV fluids dextrose 5% @100  cc/hr  - check urin electrolytes  - follow up BMP Q12hr.  Acute kidney injury  -  likely prerenal secondary to dehydration  - provide IV fluids, d5 1/2 NS @ 100 cc/hr   Aspiration pneumonia  vancomycin and zosyn per pharmacy  - speech and swallow evaluation recommended pureed diet with honey thick liq with full assist.  - aspiration precaution and MBS when his mental status improves.  - nebulizers as needed for shortness of breath  - taper steroids   Hypertension: Not well controlled. Started on PO hydralazine with better control.    Dementia: on aricept and namenda   GRAM POSITIVE COCCI positive in one blood culture: probably contaminant. Already on vancomycin.   DVT Prophylaxis - lovenox sub q  Code Status - DNR   LOS: 4 days   Ronin Rehfeldt 08/01/2011, 8:07 PM

## 2011-08-01 NOTE — Progress Notes (Signed)
Physical Therapy Treatment Patient Details Name: [name] MRN: [id number] DOB: 1940-11-24 Today's Date: 08/01/2011  PT Assessment/Plan  PT - Assessment/Plan Comments on Treatment Session: Pt continues to be lethargic and required increased cues to keep eyes open limiting PT session today. PT Plan: Discharge plan remains appropriate;Frequency remains appropriate Follow Up Recommendations: Skilled nursing facility Equipment Recommended: Defer to next venue PT Goals  Acute Rehab PT Goals PT Goal: Rolling Supine to Left Side - Progress: Progressing toward goal  PT Treatment Precautions/Restrictions  Precautions Precautions: Fall Restrictions Weight Bearing Restrictions: No Mobility (including Balance) Bed Mobility Bed Mobility: Yes Rolling Left: 1: +1 Total assist;With rail Rolling Left Details (indicate cue type and reason): pt attempted to assist by using rail once R UE guided over however still required total assist for upper and lower body to complete roll Transfers Transfers: No    Exercise  General Exercises - Lower Extremity Ankle Circles/Pumps: AROM;Both;15 reps;Supine Heel Slides: AAROM;Strengthening;Both;10 reps;Supine End of Session PT - End of Session Equipment Utilized During Treatment: Gait belt Activity Tolerance: Patient limited by fatigue Patient left: in bed;with call bell in reach;with bed alarm set General Behavior During Session: Lethargic Cognition: Impaired, at baseline  Timofey Carandang,KATHrine E 08/01/2011, 4:06 PM Pager: [phone]

## 2011-08-02 ENCOUNTER — Inpatient Hospital Stay (HOSPITAL_COMMUNITY): Payer: Medicare Other

## 2011-08-02 DIAGNOSIS — N182 Chronic kidney disease, stage 2 (mild): Secondary | ICD-10-CM

## 2011-08-02 LAB — BASIC METABOLIC PANEL
BUN: 36 mg/dL — ABNORMAL HIGH (ref 6–23)
BUN: 38 mg/dL — ABNORMAL HIGH (ref 6–23)
CO2: 26 mEq/L (ref 19–32)
CO2: 27 mEq/L (ref 19–32)
CO2: 25 mEq/L (ref 19–32)
CO2: 27 mEq/L (ref 19–32)
Calcium: 7.8 mg/dL — ABNORMAL LOW (ref 8.4–10.5)
Chloride: 122 mEq/L — ABNORMAL HIGH (ref 96–112)
Chloride: 125 mEq/L — ABNORMAL HIGH (ref 96–112)
Chloride: 119 mEq/L — ABNORMAL HIGH (ref 96–112)
Chloride: 123 mEq/L — ABNORMAL HIGH (ref 96–112)
Creatinine, Ser: 1.93 mg/dL — ABNORMAL HIGH (ref 0.50–1.35)
GFR calc Af Amer: 39 mL/min — ABNORMAL LOW (ref >90)
GFR calc Af Amer: 47 mL/min — ABNORMAL LOW (ref >90)
Glucose, Bld: 399 mg/dL — ABNORMAL HIGH (ref 70–99)
Glucose, Bld: 452 mg/dL — ABNORMAL HIGH (ref 70–99)
Potassium: 3.8 mEq/L (ref 3.5–5.1)
Potassium: 3.6 mEq/L (ref 3.5–5.1)
Potassium: 3.5 mEq/L (ref 3.5–5.1)
Sodium: 157 mEq/L — ABNORMAL HIGH (ref 135–145)
Sodium: 153 mEq/L — ABNORMAL HIGH (ref 135–145)

## 2011-08-02 LAB — CBC
HCT: 40.9 % (ref 39.0–52.0)
MCV: 99 fL (ref 78.0–100.0)
RBC: 4.13 MIL/uL — ABNORMAL LOW (ref 4.22–5.81)
RDW: 14.7 % (ref 11.5–15.5)
WBC: 9.1 10*3/uL (ref 4.0–10.5)

## 2011-08-02 LAB — GLUCOSE, CAPILLARY: Glucose-Capillary: 389 mg/dL — ABNORMAL HIGH (ref 70–99)

## 2011-08-02 MED ORDER — DEXTROSE 5 % IV SOLN
INTRAVENOUS | Status: DC
  Administered 2011-08-02 – 2011-08-05 (×5): via INTRAVENOUS

## 2011-08-02 NOTE — Progress Notes (Signed)
Inpatient Diabetes Program Recommendations  AACE/ADA: New Consensus Statement on Inpatient Glycemic Control (2009)  Target Ranges:  Prepandial:   less than 140 mg/dL      Peak postprandial:   less than 180 mg/dL (1-2 hours)      Critically ill patients:  140 - 180 mg/dL   Reason for Visit: Hyperglycemia  Results for [name], [name] (MRN [id number]) as of 08/02/2011 14:07  Ref. Range 08/01/2011 11:50 08/01/2011 17:16 08/01/2011 21:48 08/02/2011 08:06 08/02/2011 11:48  Glucose-Capillary Latest Range: 70-99 mg/dL 782 (H) 956 (H) 213 (H) 293 (H) 389 (H)    Inpatient Diabetes Program Recommendations Insulin - Basal: Increase Lantus to 20 units QHS  Note: Will continue to follow.

## 2011-08-02 NOTE — Progress Notes (Signed)
Subjective: "I'm all right".   Objective: Vital signs in last 24 hours: Temp:  [97.6 F (36.4 C)-98.9 F (37.2 C)] 97.6 F (36.4 C) (03/27 0528) Pulse Rate:  [66-79] 67  (03/27 0528) Resp:  [19-28] 19  (03/27 0528) BP: (136-184)/(85-109) 156/88 mmHg (03/27 0528) SpO2:  [94 %-99 %] 99 % (03/27 0528) Weight:  [96.2 kg (212 lb 1.3 oz)] 96.2 kg (212 lb 1.3 oz) (03/27 0528) Weight change: -1.8 kg (-3 lb 15.5 oz) Last BM Date: 08/01/11  Intake/Output from previous day: 03/26 0701 - 03/27 0700 In: 2193.3 [P.O.:280; I.V.:1513.3; IV Piggyback:400] Out: 1303 [Urine:1300; Stool:3] Total I/O In: 520 [P.O.:370; IV Piggyback:150] Out: 500 [Urine:500]   Physical Exam: General:awake, in no acute distress. HEENT: No bruits, no goiter. Heart: Regular rate and rhythm, without murmurs, rubs, gallops. Lungs: Clear to auscultation bilaterally. Abdomen: Soft, nontender, nondistended, positive bowel sounds. Extremities: No clubbing cyanosis or edema with positive pedal pulses.    Lab Results: Basic Metabolic Panel:  Basename 08/02/11 0500 08/01/11 2314  NA 158* 157*  K 3.6 3.8  CL 125* 122*  CO2 27 26  GLUCOSE 348* 399*  BUN 38* 36*  CREATININE 1.93* 1.76*  CALCIUM 7.9* 8.3*  MG -- --  PHOS -- --   CBC:  Basename 08/02/11 0500  WBC 9.1  NEUTROABS --  HGB 12.4*  HCT 40.9  MCV 99.0  PLT 323   CBG:  Basename 08/02/11 0806 08/01/11 2148 08/01/11 1716 08/01/11 1150 08/01/11 0750 07/31/11 2030  GLUCAP 293* 296* 237* 244* 284* 375*    Recent Results (from the past 240 hour(s))  CULTURE, BLOOD (ROUTINE X 2)     Status: Normal (Preliminary result)   Collection Time   07/28/11 10:40 PM      Component Value Range Status Comment   Specimen Description BLOOD LEFT ANTECUBITAL   Final    Special Requests     Final    Value: BOTTLES DRAWN AEROBIC AND ANAEROBIC AERO [facility] ANA 4 CC   Culture  Setup Time [id number]   Final    Culture     Final    Value: GRAM POSITIVE RODS     Note:  Gram Stain Report Called to,Read Back By and Verified With: [facility] REYNOLDS @ 1625 08/01/11 WICKN   Report Status PENDING   Incomplete   CULTURE, BLOOD (ROUTINE X 2)     Status: Normal (Preliminary result)   Collection Time   07/28/11 10:45 PM      Component Value Range Status Comment   Specimen Description BLOOD LEFT HAND   Final    Special Requests     Final    Value: BOTTLES DRAWN AEROBIC AND ANAEROBIC AERO 6 CC ANA 4 CC   Culture  Setup Time [id number]   Final    Culture     Final    Value:        BLOOD CULTURE RECEIVED NO GROWTH TO DATE CULTURE WILL BE HELD FOR 5 DAYS BEFORE ISSUING A FINAL NEGATIVE REPORT   Report Status PENDING   Incomplete   URINE CULTURE     Status: Normal   Collection Time   07/28/11 11:23 PM      Component Value Range Status Comment   Specimen Description URINE, CATHETERIZED   Final    Special Requests NONE   Final    Culture  Setup Time [id number]   Final    Colony Count NO GROWTH   Final    Culture NO GROWTH  Final    Report Status 07/30/2011 FINAL   Final   MRSA PCR SCREENING     Status: Normal   Collection Time   07/29/11  2:18 AM      Component Value Range Status Comment   MRSA by PCR NEGATIVE  NEGATIVE  Final     Studies/Results: No results found.  Medications: Scheduled Meds:   . allopurinol  100 mg Oral Daily  . amLODipine  10 mg Oral QPM  . aspirin EC  81 mg Oral Daily  . atenolol  50 mg Oral Daily  . atorvastatin  20 mg Oral QHS  . betaxolol  1 drop Both Eyes BID  . carbamazepine  100 mg Oral Daily  . carbamazepine  200 mg Oral QHS  . cloNIDine  0.2 mg Oral TID  . donepezil  10 mg Oral QHS  . enoxaparin (LOVENOX) injection  40 mg Subcutaneous Q24H  . FLUoxetine  20 mg Oral Daily  . hydrALAZINE  50 mg Oral Q8H  . insulin aspart  0-20 Units Subcutaneous TID WC  . insulin aspart  0-5 Units Subcutaneous QHS  . insulin aspart  5 Units Subcutaneous TID WC  . insulin glargine  10 Units Subcutaneous QHS  . lisinopril  40 mg Oral Daily   . memantine  10 mg Oral BID  . potassium chloride  40 mEq Oral BID  . predniSONE  40 mg Oral Q breakfast  . risperiDONE  0.25 mg Oral BID  . senna-docusate  2 tablet Oral QHS  . sodium chloride  3 mL Intravenous Q12H  . tolterodine  4 mg Oral Daily  . Travoprost (BAK Free)  1 drop Both Eyes QHS  . vancomycin  750 mg Intravenous Q12H  . vitamin C  500 mg Oral BID  . DISCONTD: insulin aspart  3 Units Subcutaneous TID WC  . DISCONTD: piperacillin-tazobactam (ZOSYN)  IV  3.375 g Intravenous Q8H   Continuous Infusions:   . dextrose 100 mL/hr at 08/02/11 0918  . DISCONTD: dextrose 5 % and 0.45% NaCl 100 mL/hr at 08/01/11 1546   PRN Meds:.acetaminophen, albuterol, food thickener, hydrALAZINE, ipratropium, LORazepam  Assessment/Plan:  Principal Problem:  *Altered mental status Active Problems:  Hypernatremia  Acute kidney injury  Aspiration pneumonia  CKD (chronic kidney disease) stage 2, GFR 60-89 ml/min   #1 Encephalopathy: ??baseline. Will try to contact family members. Suspect related to hypernatremia as well.  #2 Hypernatremia: Na increased from 157-158 today. Will switch IVF to dextrose only. Follow.  #3 Acute on CKD Stage II: not improving despite IVF. Check renal ultrasound. ??new baseline for him. Cr in 5/12 was 1.16.  #4 ??Aspiration PNA: See no evidence for this on initial CXR. Will strike diagnosis from problem list, taper steroids. Discontinue antibiotics.  #5 Dysphagia: diet as per ST recs.  #6 Bacteremia: 1/2 Blood cultures with GPR, speciation pending. Likely a contaminant, will keep on vanc for now pending speciation from microbiology lab.  #7 Dispo: Not medically ready. Back to SNF once ready. May benefit from palliative care consult if he continues to have repeated admissions for hypernatremia.    LOS: 5 days   [facility] Pager: [phone] 08/02/2011, 11:08 AM

## 2011-08-02 NOTE — Progress Notes (Signed)
Speech Language Pathology Dysphagia Treatment  Patient Details Name: [name] MRN: [id number] DOB: 11-12-1940 Today's Date: 08/02/2011 1150-1235   SLP Assessment/Plan/Recommendation Assessment / Recommendations / Plan Clinical Impression Statement: Pt continues with severe oral phase dysphagia today mostly due to excessive lethargy requiring total verbal cues for pt to swallow.  Significant delay in swallow observed with cues and oral stasis that SLP removed using oral suction and toothette at completion of meal.  At this time, do not recommend proceed with MBS due to pt's lethargy and pt appearing to tolerate po well with total assist.  Concern for pt's ability to advance diet due to waxing/waning alertness level.  Rec continue modified diet with strict aspiration precautions.  Posted new signs to assure pt's mouth is cleaned after meal.  Also note, pt with appearance of yellow tinged coating, ? yeast?    Continue with Current Diet: Dysphagia 1 (puree);Honey-thick liquid Initiate / Change Diet: Nectar-thick liquid Liquids provided via: Cup Medication Administration: Crushed with puree Supervision: Staff feed patient Compensations: Slow rate;Small sips/bites;Check for pocketing Postural Changes and/or Swallow Maneuvers: Seated upright 90 degrees Oral Care Recommendations: Oral care BID Plan: MBS;Continue with current plan of care (MBS with improved LOA) Swallowing Goals  SLP Swallowing Goals Swallow Study Goal #1 - Progress: Revised (modified due to lack of progress/goal met) Patient will utilize recommended strategies during swallow to increase swallowing safety with: Total assistance  General Respiratory Status: Supplemental O2 delivered via (comment) Behavior/Cognition: Lethargic;Distractible;Requires cueing Oral Cavity - Dentition: Edentulous Patient Positioning: Upright in bed  Oral Cavity - Oral Hygiene     Dysphagia Treatment Treatment focused on: Skilled observation  of diet tolerance;Facilitation of oral phase;Utilization of compensatory strategies Treatment Methods/Modalities: Skilled observation Patient observed directly with PO's: Yes Type of PO's observed: Dysphagia 1 (puree);Honey-thick liquids Feeding: Total assist Liquids provided via: Cup Oral Phase Signs & Symptoms: Prolonged oral phase;Right pocketing;Left pocketing Type of cueing: Verbal;Tactile Amount of cueing: Total    [name], [facility] SLP [phone]

## 2011-08-03 LAB — GLUCOSE, CAPILLARY
Glucose-Capillary: 267 mg/dL — ABNORMAL HIGH (ref 70–99)
Glucose-Capillary: 383 mg/dL — ABNORMAL HIGH (ref 70–99)

## 2011-08-03 LAB — BASIC METABOLIC PANEL
BUN: 30 mg/dL — ABNORMAL HIGH (ref 6–23)
CO2: 29 mEq/L (ref 19–32)
Calcium: 8 mg/dL — ABNORMAL LOW (ref 8.4–10.5)
Calcium: 7.9 mg/dL — ABNORMAL LOW (ref 8.4–10.5)
Creatinine, Ser: 1.71 mg/dL — ABNORMAL HIGH (ref 0.50–1.35)
GFR calc non Af Amer: 45 mL/min — ABNORMAL LOW (ref >90)
Glucose, Bld: 264 mg/dL — ABNORMAL HIGH (ref 70–99)
Glucose, Bld: 341 mg/dL — ABNORMAL HIGH (ref 70–99)

## 2011-08-03 LAB — CBC
MCH: 30.8 pg (ref 26.0–34.0)
MCHC: 31 g/dL (ref 30.0–36.0)
MCV: 99.5 fL (ref 78.0–100.0)
Platelets: 304 10*3/uL (ref 150–400)
RDW: 14.5 % (ref 11.5–15.5)

## 2011-08-03 LAB — CULTURE, BLOOD (ROUTINE X 2)

## 2011-08-03 MED ORDER — VANCOMYCIN HCL IN DEXTROSE 1-5 GM/200ML-% IV SOLN
1000.0000 mg | Freq: Two times a day (BID) | INTRAVENOUS | Status: DC
  Filled 2011-08-03: qty 200

## 2011-08-03 MED ORDER — POTASSIUM CHLORIDE CRYS ER 20 MEQ PO TBCR
40.0000 meq | EXTENDED_RELEASE_TABLET | ORAL | Status: AC
  Administered 2011-08-03 (×2): 40 meq via ORAL
  Filled 2011-08-03 (×2): qty 2

## 2011-08-03 MED ORDER — PREDNISONE 10 MG PO TABS
10.0000 mg | ORAL_TABLET | Freq: Every day | ORAL | Status: AC
  Administered 2011-08-05: 10 mg via ORAL
  Filled 2011-08-03: qty 1

## 2011-08-03 MED ORDER — HYDRALAZINE HCL 50 MG PO TABS
100.0000 mg | ORAL_TABLET | Freq: Three times a day (TID) | ORAL | Status: DC
  Administered 2011-08-03 – 2011-08-07 (×12): 100 mg via ORAL
  Filled 2011-08-03 (×17): qty 2

## 2011-08-03 MED ORDER — PREDNISONE 20 MG PO TABS
20.0000 mg | ORAL_TABLET | Freq: Every day | ORAL | Status: AC
  Administered 2011-08-04: 20 mg via ORAL
  Filled 2011-08-03: qty 1

## 2011-08-03 MED ORDER — INSULIN GLARGINE 100 UNIT/ML ~~LOC~~ SOLN
20.0000 [IU] | Freq: Every day | SUBCUTANEOUS | Status: DC
  Administered 2011-08-03 – 2011-08-06 (×4): 20 [IU] via SUBCUTANEOUS

## 2011-08-03 NOTE — Progress Notes (Signed)
Patient seen and examined. Agree with note by Toya Smothers, NP. His encephalopathy is improving and I suspect he is approaching baseline. Na 155 today from 158 previously. Cont IVF. Creatinine improving, renal US negative for obstruction/hydronephrosis. Will discontinue vancomycin as blood culture 1/2 with propionobacterium species (contaminant). CBGs elevated. Increasing lantus, tapering steroids. His BP has been elevated. Will increase hydralazine from 50-100mg  TID. Can likely be discharged back to SNF in am.  Peggye Pitt, MD [facility] Pager: [phone]

## 2011-08-03 NOTE — Progress Notes (Signed)
Subjective: Denies pain/discomfort. Alert  Objective: Vital signs Filed Vitals:   08/02/11 1515 08/02/11 2119 08/03/11 0443 08/03/11 0618  BP: 189/108 151/79 160/90   Pulse: 71 69 60   Temp: 98.1 F (36.7 C) 98.8 F (37.1 C) 98.1 F (36.7 C)   TempSrc: Oral Oral Oral   Resp: 18 20 18    Height:      Weight:    96.9 kg (213 lb 10 oz)  SpO2: 96% 94% 95%    Weight change: 0.7 kg (1 lb 8.7 oz) Last BM Date: 08/02/11  Intake/Output from previous day: 03/27 0701 - 03/28 0700 In: 3230 [P.O.:760; I.V.:2170; IV Piggyback:300] Out: 2100 [Urine:2100] Total I/O In: 120 [P.O.:120] Out: -    Physical Exam: General: Alert, awake, oriented to self, in no acute distress. HEENT: No bruits, no goiter.Mucus membrane mouth moist/pink Heart: Regular rate and rhythm, without murmurs, rubs, gallops. Lungs: Normal effort. Breath sounds with mild rhonchi otherwise clear to auscultation bilaterally. No wheeze Abdomen: Soft, nontender, nondistended, positive bowel sounds. Extremities: No clubbing cyanosis or edema with positive pedal pulses. Neuro: Grossly intact, nonfocal. Speech slow    Lab Results: Basic Metabolic Panel:  Basename 08/03/11 1010 08/03/11 0515  NA 154* 155*  K 3.2* 3.2*  CL 120* 121*  CO2 26 29  GLUCOSE 341* 264*  BUN 30* 33*  CREATININE 1.49* 1.71*  CALCIUM 7.9* 8.0*  MG -- --  PHOS -- --   Liver Function Tests: No results found for this basename: AST:2,ALT:2,ALKPHOS:2,BILITOT:2,PROT:2,ALBUMIN:2 in the last 72 hours No results found for this basename: LIPASE:2,AMYLASE:2 in the last 72 hours No results found for this basename: AMMONIA:2 in the last 72 hours CBC:  Basename 08/03/11 0515 08/02/11 0500  WBC 9.3 9.1  NEUTROABS -- --  HGB 11.9* 12.4*  HCT 38.4* 40.9  MCV 99.5 99.0  PLT 304 323   Cardiac Enzymes: No results found for this basename: CKTOTAL:3,CKMB:3,CKMBINDEX:3,TROPONINI:3 in the last 72 hours BNP: No results found for this basename: PROBNP:3  in the last 72 hours D-Dimer: No results found for this basename: DDIMER:2 in the last 72 hours CBG:  Basename 08/03/11 1148 08/03/11 0736 08/02/11 2116 08/02/11 1705 08/02/11 1148 08/02/11 0806  GLUCAP 347* 267* 336* 321* 389* 293*   Hemoglobin A1C: No results found for this basename: HGBA1C in the last 72 hours Fasting Lipid Panel: No results found for this basename: CHOL,HDL,LDLCALC,TRIG,CHOLHDL,LDLDIRECT in the last 72 hours Thyroid Function Tests: No results found for this basename: TSH,T4TOTAL,FREET4,T3FREE,THYROIDAB in the last 72 hours Anemia Panel: No results found for this basename: VITAMINB12,FOLATE,FERRITIN,TIBC,IRON,RETICCTPCT in the last 72 hours Coagulation: No results found for this basename: LABPROT:2,INR:2 in the last 72 hours Urine Drug Screen: Drugs of Abuse  No results found for this basename: labopia, cocainscrnur, labbenz, amphetmu, thcu, labbarb    Alcohol Level: No results found for this basename: ETH:2 in the last 72 hours Urinalysis: No results found for this basename: COLORURINE:2,APPERANCEUR:2,LABSPEC:2,PHURINE:2,GLUCOSEU:2,HGBUR:2,BILIRUBINUR:2,KETONESUR:2,PROTEINUR:2,UROBILINOGEN:2,NITRITE:2,LEUKOCYTESUR:2 in the last 72 hours Misc. Labs:  Recent Results (from the past 240 hour(s))  CULTURE, BLOOD (ROUTINE X 2)     Status: Normal   Collection Time   07/28/11 10:40 PM      Component Value Range Status Comment   Specimen Description BLOOD LEFT ANTECUBITAL   Final    Special Requests     Final    Value: BOTTLES DRAWN AEROBIC AND ANAEROBIC AERO [facility] ANA 4 CC   Culture  Setup Time [id number]   Final    Culture     Final  Value: PROPIONIBACTERIUM SPECIES     Note: Gram Stain Report Called to,Read Back By and Verified With: EMILY REYNOLDS @ 1625 08/01/11 WICKN   Report Status 08/03/2011 FINAL   Final   CULTURE, BLOOD (ROUTINE X 2)     Status: Normal (Preliminary result)   Collection Time   07/28/11 10:45 PM      Component Value Range Status Comment     Specimen Description BLOOD LEFT HAND   Final    Special Requests     Final    Value: BOTTLES DRAWN AEROBIC AND ANAEROBIC AERO 6 CC ANA 4 CC   Culture  Setup Time [id number]   Final    Culture     Final    Value:        BLOOD CULTURE RECEIVED NO GROWTH TO DATE CULTURE WILL BE HELD FOR 5 DAYS BEFORE ISSUING A FINAL NEGATIVE REPORT   Report Status PENDING   Incomplete   URINE CULTURE     Status: Normal   Collection Time   07/28/11 11:23 PM      Component Value Range Status Comment   Specimen Description URINE, CATHETERIZED   Final    Special Requests NONE   Final    Culture  Setup Time [id number]   Final    Colony Count NO GROWTH   Final    Culture NO GROWTH   Final    Report Status 07/30/2011 FINAL   Final   MRSA PCR SCREENING     Status: Normal   Collection Time   07/29/11  2:18 AM      Component Value Range Status Comment   MRSA by PCR NEGATIVE  NEGATIVE  Final     Studies/Results: US Renal  08/02/2011  *RADIOLOGY REPORT*  Clinical Data: Acute renal failure which is not improving during IV hydration.  History of colon cancer.  RENAL/URINARY TRACT ULTRASOUND COMPLETE 08/02/2011:  Comparison:  PET CT 09/06/2009, 09/10/2008.  Findings:  Right Kidney:  No hydronephrosis.  Echogenic parenchyma consistent with medical renal disease.  Well-preserved cortex.  No focal parenchymal abnormality.  No visible shadowing calculi. Approximately 12.2 cm in length.  Left Kidney:  No hydronephrosis.  Echogenic parenchyma consistent with medical renal disease.  Well-preserved cortex.  Approximate 1.3 cm parapelvic cyst.  No significant focal parenchymal abnormality.  No visible shadowing calculi; an echogenic focus arising from the lower pole was shown on prior CT to represent vascular calcification.  Approximately 12.7 cm in length.  Bladder:  Decompressed by Foley catheter.  Other findings:  Multiple shadowing gallstones in the gallbladder.  IMPRESSION:  1.  No evidence of hydronephrosis involving  either kidney to suggest obstruction. 2.  Echogenic kidneys bilaterally consistent with medical renal disease. 3.  Cholelithiasis.  Original Report Authenticated By: Arnell Sieving, M.D.    Medications: Scheduled Meds:   . allopurinol  100 mg Oral Daily  . amLODipine  10 mg Oral QPM  . aspirin EC  81 mg Oral Daily  . atenolol  50 mg Oral Daily  . atorvastatin  20 mg Oral QHS  . betaxolol  1 drop Both Eyes BID  . carbamazepine  100 mg Oral Daily  . carbamazepine  200 mg Oral QHS  . cloNIDine  0.2 mg Oral TID  . donepezil  10 mg Oral QHS  . enoxaparin (LOVENOX) injection  40 mg Subcutaneous Q24H  . FLUoxetine  20 mg Oral Daily  . hydrALAZINE  50 mg Oral Q8H  . insulin aspart  0-20  Units Subcutaneous TID WC  . insulin aspart  0-5 Units Subcutaneous QHS  . insulin aspart  5 Units Subcutaneous TID WC  . insulin glargine  20 Units Subcutaneous QHS  . lisinopril  40 mg Oral Daily  . memantine  10 mg Oral BID  . predniSONE  40 mg Oral Q breakfast  . risperiDONE  0.25 mg Oral BID  . senna-docusate  2 tablet Oral QHS  . sodium chloride  3 mL Intravenous Q12H  . tolterodine  4 mg Oral Daily  . Travoprost (BAK Free)  1 drop Both Eyes QHS  . vancomycin  750 mg Intravenous Q12H  . vitamin C  500 mg Oral BID  . DISCONTD: insulin glargine  10 Units Subcutaneous QHS   Continuous Infusions:   . dextrose 100 mL/hr at 08/03/11 0614   PRN Meds:.acetaminophen, albuterol, food thickener, hydrALAZINE, ipratropium, LORazepam  Assessment/Plan:  Principal Problem:  *Altered mental status Active Problems:  Hypernatremia  Acute kidney injury  Aspiration pneumonia  CKD (chronic kidney disease) stage 2, GFR 60-89 ml/min #1 Encephalopathy: ??baseline. More alert and communicative. Suspect approaching baseline.   #2 Hypernatremia: Na 154 today from 157-158 today. Will contine IVF of dextrose only.will decrease rate. Volume status +2L for hospitalization.  Follow.  #3 Acute on CKD Stage II:  Improving with IVF.  Creatinine 1.49>1.71. Renal US neg for obstruction. Consistent with kidney disease.   Cr in 5/12 was 1.16.  #4 ??Aspiration PNA: See no evidence for this on initial CXR. Will strike diagnosis from problem list, taper steroids.  Discontinue antibiotics. See ST evaluation.  #5 Dysphagia: diet as per ST recs.  #6 Bacteremia: 1/2 Blood cultures with GPR, speciation pending. Likely a contaminant, will keep on vanc for now pending speciation from microbiology lab. Afebrile. Nontoxic appearing.  #7. HTN: poor control. Will use prn hydralazine. Could be related to volume. Monitor closely. #7 Dispo: Not medically ready. Back to SNF once ready. May benefit from palliative care consult if he continues to have repeated admissions for hypernatremia.    LOS: 6 days   [facility] M 08/03/2011, 1:19 PM

## 2011-08-03 NOTE — Progress Notes (Signed)
Speech Language Pathology Dysphagia Treatment  Patient Details Name: [name] MRN: [id number] DOB: 1941/01/11 Today's Date: 08/03/2011 1030-1040  SLP Assessment/Plan/Recommendation Assessment / Recommendations / Plan Clinical Impression Statement: As pt's LOA is improved during today's session, his swallow initiation was much improved without clinical s/s of aspiration, but continued requiring verbal cue to swallow x1/4 boluses. Intake of breakfast was 50%.  Sister Delray Alt arrived to visit pt and reports she visits pt at [facility] and finds he has food pocketed in his mouth.  In addition, she reports waxing/waning alertness.  At this point, SLP does not anticipate MBS will change pt's careplan given varying LOA and premorbid dysphagia.  Recommend consider cancelling MBS and continue with modified diet to accommodate pt's mental status.  In addition, recommend consider allowing pt THIN WATER between meals for maximal comfort.  SLP to follow for clinical assessment and improvement.  Pt continues with dysarthria but can improve intelligibility be slowing rate of speech.    Continue with Current Diet: Dysphagia 1 (puree);Honey-thick liquid Initiate / Change Diet: Nectar-thick liquid;Other (comment) (thin water between meals) Liquids provided via: Cup Medication Administration: Crushed with puree Supervision: Staff feed patient Compensations: Slow rate;Small sips/bites;Unable to follow commands to complete;Follow solids with liquid (CUE PT TO SWALLOW, check for oral stasis) Postural Changes and/or Swallow Maneuvers: Seated upright 90 degrees;Upright 30-60 min after meal Oral Care Recommendations: Oral care BID Plan: Continue with current plan of care Swallowing Goals  SLP Swallowing Goals Swallow Study Goal #2 - Progress: Progressing toward goal  Oral Cavity - Oral Hygiene   Continues with yellow tinged coating on lingual musculature.    Dysphagia Treatment Treatment focused on: Skilled  observation of diet tolerance;Upgraded PO texture trials;Patient/family/caregiver education Family/Caregiver Educated: sister Delray Alt present Treatment Methods/Modalities: Skilled observation Patient observed directly with PO's: Yes Type of PO's observed: Thin liquids (water) Feeding: Needs assist Liquids provided via: Cup Pharyngeal Phase Signs & Symptoms: Suspected delayed swallow initiation (more timely swallow initiation compared to previous date) Type of cueing: Verbal Amount of cueing: Minimal     Brandon Burnet, [name] [facility] SLP [phone]

## 2011-08-03 NOTE — Progress Notes (Addendum)
ANTIBIOTIC CONSULT NOTE - Follow Up  Pharmacy Consult for Vancomycin Indication: GPR in 1/2 Blood cx  No Known Allergies  Patient Measurements: Height: 6' (182.9 cm) Weight: 213 lb 10 oz (96.9 kg) IBW/kg (Calculated) : 77.6   Vital Signs: Temp: 98.1 F (36.7 C) (03/28 0443) Temp src: Oral (03/28 0443) BP: 160/90 mmHg (03/28 0443) Pulse Rate: 60  (03/28 0443) Intake/Output from previous day: 03/27 0701 - 03/28 0700 In: 3230 [P.O.:760; I.V.:2170; IV Piggyback:300] Out: 2100 [Urine:2100] Intake/Output from this shift: Total I/O In: 120 [P.O.:120] Out: -   Labs:  Basename 08/03/11 1010 08/03/11 0515 08/02/11 2314 08/02/11 0500  WBC -- 9.3 -- 9.1  HGB -- 11.9* -- 12.4*  PLT -- 304 -- 323  LABCREA -- -- -- --  CREATININE 1.49* 1.71* 1.65* --   Estimated Creatinine Clearance: 54.9 ml/min (by C-G formula based on Cr of 1.49).  Basename 08/03/11 1010  VANCOTROUGH 12.4  VANCOPEAK --  VANCORANDOM --  GENTTROUGH --  GENTPEAK --  GENTRANDOM --  TOBRATROUGH --  TOBRAPEAK --  TOBRARND --  AMIKACINPEAK --  AMIKACINTROU --  AMIKACIN --     Microbiology: Recent Results (from the past 720 hour(s))  CULTURE, BLOOD (ROUTINE X 2)     Status: Normal   Collection Time   07/28/11 10:40 PM      Component Value Range Status Comment   Specimen Description BLOOD LEFT ANTECUBITAL   Final    Special Requests     Final    Value: BOTTLES DRAWN AEROBIC AND ANAEROBIC AERO [facility] ANA 4 CC   Culture  Setup Time [id number]   Final    Culture     Final    Value: PROPIONIBACTERIUM SPECIES     Note: Gram Stain Report Called to,Read Back By and Verified With: [facility] REYNOLDS @ 1625 08/01/11 WICKN   Report Status 08/03/2011 FINAL   Final   CULTURE, BLOOD (ROUTINE X 2)     Status: Normal (Preliminary result)   Collection Time   07/28/11 10:45 PM      Component Value Range Status Comment   Specimen Description BLOOD LEFT HAND   Final    Special Requests     Final    Value: BOTTLES DRAWN  AEROBIC AND ANAEROBIC AERO 6 CC ANA 4 CC   Culture  Setup Time [id number]   Final    Culture     Final    Value:        BLOOD CULTURE RECEIVED NO GROWTH TO DATE CULTURE WILL BE HELD FOR 5 DAYS BEFORE ISSUING A FINAL NEGATIVE REPORT   Report Status PENDING   Incomplete   URINE CULTURE     Status: Normal   Collection Time   07/28/11 11:23 PM      Component Value Range Status Comment   Specimen Description URINE, CATHETERIZED   Final    Special Requests NONE   Final    Culture  Setup Time [id number]   Final    Colony Count NO GROWTH   Final    Culture NO GROWTH   Final    Report Status 07/30/2011 FINAL   Final   MRSA PCR SCREENING     Status: Normal   Collection Time   07/29/11  2:18 AM      Component Value Range Status Comment   MRSA by PCR NEGATIVE  NEGATIVE  Final     Medical History: Past Medical History  Diagnosis Date  . Colon cancer   .  Dementia   . CHF (congestive heart failure)   . Hypertension   . Hyperlipemia   . Diabetes mellitus   . History of CVA (cerebrovascular accident)   . History of colectomy   . Mild mental retardation     Medications:  Anti-infectives     Start     Dose/Rate Route Frequency Ordered Stop   08/01/11 1100   vancomycin (VANCOCIN) 750 mg in sodium chloride 0.9 % 150 mL IVPB        750 mg 150 mL/hr over 60 Minutes Intravenous Every 12 hours 08/01/11 1026     07/29/11 1000   vancomycin (VANCOCIN) 750 mg in sodium chloride 0.9 % 150 mL IVPB        750 mg 150 mL/hr over 60 Minutes Intravenous  Once 07/29/11 0359 07/29/11 1256   07/29/11 0600   piperacillin-tazobactam (ZOSYN) IVPB 3.375 g  Status:  Discontinued        3.375 g 12.5 mL/hr over 240 Minutes Intravenous 3 times per day 07/29/11 0124 08/02/11 1107   07/29/11 0130   vancomycin (VANCOCIN) IVPB 1000 mg/200 mL premix  Status:  Discontinued        1,000 mg 200 mL/hr over 60 Minutes Intravenous  Once 07/29/11 0124 07/29/11 0238   07/28/11 2200   vancomycin (VANCOCIN) IVPB 1000  mg/200 mL premix        1,000 mg 200 mL/hr over 60 Minutes Intravenous  Once 07/28/11 2155 07/29/11 0035   07/28/11 2200  piperacillin-tazobactam (ZOSYN) IVPB 3.375 g       3.375 g 100 mL/hr over 30 Minutes Intravenous  Once 07/28/11 2155 07/28/11 2321         Assessment: 71yom on day #6 vancomycin for positive blood culture.  Patient's blood culture (3/22) resulted 1/2 propionibacterium species.  This is a frequent blood culture contaminant but this is also typically a slow grower so potentially the other blood culture could become positive.  Not sure if we should wait until both cultures are final or considering that the patient is afebrile and WBC WNL, this may very well just be a contaminant and we could possibly discontinue vancomycin at this time.  Vancomycin trough returned at 12.4 today.  SCr remains elevated, CrCl~47 ml/min.  Will adjust vancomycin to achieve goal trough level.  Goal of Therapy:  Vancomycin trough level 15-20 mcg/ml  Plan:  F/u MD's assessment of blood culture results and continued antibiotic treatment. Adjust vancomycin to 1g IV q12h.  Clance Boll 08/03/2011,11:20 AM

## 2011-08-04 LAB — GLUCOSE, CAPILLARY
Glucose-Capillary: 231 mg/dL — ABNORMAL HIGH (ref 70–99)
Glucose-Capillary: 387 mg/dL — ABNORMAL HIGH (ref 70–99)

## 2011-08-04 LAB — CULTURE, BLOOD (ROUTINE X 2)
Culture  Setup Time: 201303230505
Culture: NO GROWTH

## 2011-08-04 LAB — BASIC METABOLIC PANEL
BUN: 27 mg/dL — ABNORMAL HIGH (ref 6–23)
Chloride: 118 mEq/L — ABNORMAL HIGH (ref 96–112)
GFR calc Af Amer: 51 mL/min — ABNORMAL LOW (ref >90)
Glucose, Bld: 281 mg/dL — ABNORMAL HIGH (ref 70–99)
Potassium: 3.9 mEq/L (ref 3.5–5.1)

## 2011-08-04 NOTE — Progress Notes (Signed)
Subjective: Much more alert today.  Objective: Vital signs in last 24 hours: Temp:  [97.6 F (36.4 C)-98.3 F (36.8 C)] 97.6 F (36.4 C) (03/29 0536) Pulse Rate:  [51-62] 62  (03/29 0630) Resp:  [18-20] 18  (03/29 0536) BP: (148-163)/(79-90) 148/83 mmHg (03/29 0536) SpO2:  [93 %-100 %] 97 % (03/29 0536) Weight:  [98.2 kg (216 lb 7.9 oz)] 98.2 kg (216 lb 7.9 oz) (03/29 0536) Weight change: 1.3 kg (2 lb 13.9 oz) Last BM Date: 08/03/11  Intake/Output from previous day: 03/28 0701 - 03/29 0700 In: 2399.2 [P.O.:960; I.V.:1439.2] Out: 2200 [Urine:2200]     Physical Exam: General: awake, in no acute distress. HEENT: No bruits, no goiter. Heart: Regular rate and rhythm, without murmurs, rubs, gallops. Lungs: Clear to auscultation bilaterally. Abdomen: Soft, nontender, nondistended, positive bowel sounds. Extremities: No clubbing cyanosis or edema with positive pedal pulses.    Lab Results: Basic Metabolic Panel:  Basename 08/04/11 0522 08/03/11 1010  NA 152* 154*  K 3.9 3.2*  CL 118* 120*  CO2 28 26  GLUCOSE 281* 341*  BUN 27* 30*  CREATININE 1.54* 1.49*  CALCIUM 8.2* 7.9*  MG -- --  PHOS -- --    CBC:  Basename 08/03/11 0515 08/02/11 0500  WBC 9.3 9.1  NEUTROABS -- --  HGB 11.9* 12.4*  HCT 38.4* 40.9  MCV 99.5 99.0  PLT 304 323   CBG:  Basename 08/04/11 0742 08/03/11 2044 08/03/11 1646 08/03/11 1148 08/03/11 0736 08/02/11 2116  GLUCAP 231* 383* 379* 347* 267* 336*    Recent Results (from the past 240 hour(s))  CULTURE, BLOOD (ROUTINE X 2)     Status: Normal   Collection Time   07/28/11 10:40 PM      Component Value Range Status Comment   Specimen Description BLOOD LEFT ANTECUBITAL   Final    Special Requests     Final    Value: BOTTLES DRAWN AEROBIC AND ANAEROBIC AERO [facility] ANA 4 CC   Culture  Setup Time [id number]   Final    Culture     Final    Value: PROPIONIBACTERIUM SPECIES     Note: Gram Stain Report Called to,Read Back By and Verified With:  [facility] REYNOLDS @ 1625 08/01/11 WICKN   Report Status 08/03/2011 FINAL   Final   CULTURE, BLOOD (ROUTINE X 2)     Status: Normal   Collection Time   07/28/11 10:45 PM      Component Value Range Status Comment   Specimen Description BLOOD LEFT HAND   Final    Special Requests     Final    Value: BOTTLES DRAWN AEROBIC AND ANAEROBIC AERO 6 CC ANA 4 CC   Culture  Setup Time [id number]   Final    Culture NO GROWTH 5 DAYS   Final    Report Status 08/04/2011 FINAL   Final   URINE CULTURE     Status: Normal   Collection Time   07/28/11 11:23 PM      Component Value Range Status Comment   Specimen Description URINE, CATHETERIZED   Final    Special Requests NONE   Final    Culture  Setup Time [id number]   Final    Colony Count NO GROWTH   Final    Culture NO GROWTH   Final    Report Status 07/30/2011 FINAL   Final   MRSA PCR SCREENING     Status: Normal   Collection Time   07/29/11  2:18  AM      Component Value Range Status Comment   MRSA by PCR NEGATIVE  NEGATIVE  Final     Studies/Results: US Renal  08/02/2011  *RADIOLOGY REPORT*  Clinical Data: Acute renal failure which is not improving during IV hydration.  History of colon cancer.  RENAL/URINARY TRACT ULTRASOUND COMPLETE 08/02/2011:  Comparison:  PET CT 09/06/2009, 09/10/2008.  Findings:  Right Kidney:  No hydronephrosis.  Echogenic parenchyma consistent with medical renal disease.  Well-preserved cortex.  No focal parenchymal abnormality.  No visible shadowing calculi. Approximately 12.2 cm in length.  Left Kidney:  No hydronephrosis.  Echogenic parenchyma consistent with medical renal disease.  Well-preserved cortex.  Approximate 1.3 cm parapelvic cyst.  No significant focal parenchymal abnormality.  No visible shadowing calculi; an echogenic focus arising from the lower pole was shown on prior CT to represent vascular calcification.  Approximately 12.7 cm in length.  Bladder:  Decompressed by Foley catheter.  Other findings:  Multiple  shadowing gallstones in the gallbladder.  IMPRESSION:  1.  No evidence of hydronephrosis involving either kidney to suggest obstruction. 2.  Echogenic kidneys bilaterally consistent with medical renal disease. 3.  Cholelithiasis.  Original Report Authenticated By: Arnell Sieving, M.D.    Medications: Scheduled Meds:   . allopurinol  100 mg Oral Daily  . amLODipine  10 mg Oral QPM  . aspirin EC  81 mg Oral Daily  . atenolol  50 mg Oral Daily  . atorvastatin  20 mg Oral QHS  . betaxolol  1 drop Both Eyes BID  . carbamazepine  100 mg Oral Daily  . carbamazepine  200 mg Oral QHS  . cloNIDine  0.2 mg Oral TID  . donepezil  10 mg Oral QHS  . enoxaparin (LOVENOX) injection  40 mg Subcutaneous Q24H  . FLUoxetine  20 mg Oral Daily  . hydrALAZINE  100 mg Oral Q8H  . insulin aspart  0-20 Units Subcutaneous TID WC  . insulin aspart  0-5 Units Subcutaneous QHS  . insulin aspart  5 Units Subcutaneous TID WC  . insulin glargine  20 Units Subcutaneous QHS  . lisinopril  40 mg Oral Daily  . memantine  10 mg Oral BID  . potassium chloride  40 mEq Oral Q4H  . predniSONE  10 mg Oral Q breakfast  . predniSONE  20 mg Oral Q breakfast  . risperiDONE  0.25 mg Oral BID  . senna-docusate  2 tablet Oral QHS  . sodium chloride  3 mL Intravenous Q12H  . tolterodine  4 mg Oral Daily  . Travoprost (BAK Free)  1 drop Both Eyes QHS  . vitamin C  500 mg Oral BID  . DISCONTD: hydrALAZINE  50 mg Oral Q8H  . DISCONTD: insulin glargine  10 Units Subcutaneous QHS  . DISCONTD: predniSONE  40 mg Oral Q breakfast  . DISCONTD: vancomycin  750 mg Intravenous Q12H  . DISCONTD: vancomycin  1,000 mg Intravenous Q12H   Continuous Infusions:   . dextrose 50 mL/hr at 08/03/11 1851   PRN Meds:.acetaminophen, albuterol, food thickener, hydrALAZINE, ipratropium, LORazepam  Assessment/Plan:  Principal Problem:  *Altered mental status Active Problems:  Hypernatremia  Acute kidney injury  Aspiration pneumonia  CKD  (chronic kidney disease) stage 2, GFR 60-89 ml/min   #1 Encephalopathy: at baseline. Likely 2/2 hypernatremia on top of baseline dementia.  #2 Hypernatremia: Na improved to 152 today. Suspect this has contributed to his encephalopathy. Continue IVF today.  #3 Acute on CKD Stage II: Suspect  this is a new baseline for him. Cr has been steady in the 1.4-1.6 range despite IVF hydration. Renal US without evidence for obstruction or hydronephrosis. Cr in 5/12 was 1.16.  #4 Dysphagia: Diet as per ST recs.  #5 Dispo: spoke with patient's PCP at SNF yesterday, Dr. Frederik Pear, who had some concerns regarding patient returning back to SNF. Today had a long phone conversation with patient's niece and HCPOA, Cari Caraway. Explained how hypernatremia in a demented patient with dysphagia can become a vicious cycle with repeated hospital admissions for confusion that quickly resolve with hydration and correction of Na levels. For now, she would like to continue to treat him with IVF when indicated. If this happened repeatedly then she would consider withdrawing  care. She was told by the head nurse at the SNF that they could do IVF there (??). I have told her that as long as his PO fluid intake is encouraged there should be no need (or little need) for IVF. Will give patient one more day of IVF, then consider D/C back to SNF over weekend, if they will accept him, otherwise will need to wait until Monday. His mental status, as stated above, is back to his baseline.    LOS: 7 days   [facility] Pager: [phone] 08/04/2011, 10:59 AM

## 2011-08-04 NOTE — Progress Notes (Signed)
Physical Therapy Treatment Patient Details Name: [name] MRN: [id number] DOB: Apr 14, 1941 Today's Date: 08/04/2011  PT Assessment/Plan  PT - Assessment/Plan Comments on Treatment Session: Pt more alert today and able to follow commands.  Pt sat EOB for 3 minutes with occasional minA for trunk.  Pt fatigues quickly however so returned to supine. PT Plan: Discharge plan remains appropriate;Frequency remains appropriate Follow Up Recommendations: Skilled nursing facility Equipment Recommended: Defer to next venue PT Goals  Acute Rehab PT Goals PT Goal: Supine/Side to Sit - Progress: Progressing toward goal Pt will Sit at [facility] of Bed: 3-5 min;with unilateral upper extremity support;with supervision PT Goal: Sit at [facility] Of Bed - Progress: Goal set today  PT Treatment Precautions/Restrictions  Precautions Precautions: Fall Restrictions Weight Bearing Restrictions: No Mobility (including Balance) Bed Mobility Bed Mobility: Yes Supine to Sit: 2: Max assist;With rails;HOB elevated (Comment degrees) Supine to Sit Details (indicate cue type and reason): pt requires assist for bringing LEs to EOB as well as with trunk, increased verbal and manual cues for technique Sit to Supine: 2: Max assist;HOB flat Sit to Supine - Details (indicate cue type and reason): assist for upper and lower body Scooting to [facility]: 2: Max assist Scooting to [facility] Details (indicate cue type and reason): pt able to somewhat assist after guiding UEs overhead to hold siderails Transfers Transfers: No  Static Sitting Balance Static Sitting - Balance Support: Bilateral upper extremity supported;Feet supported Static Sitting - Level of Assistance: 4: Min assist;5: Stand by assistance Static Sitting - Comment/# of Minutes: pt sat EOB for 3 minutes with occasional manual assist for trunk due to forward trunk lean otherwise supervision until fatigue with forward trunk lean again Exercise    End of Session PT - End of  Session Activity Tolerance: Patient limited by fatigue Patient left: in bed;with bed alarm set;with call bell in reach General Behavior During Session: [facility] for tasks performed Cognition: Impaired, at baseline  Angila Wombles,KATHrine E 08/04/2011, 2:13 PM Pager: [phone]

## 2011-08-04 NOTE — Progress Notes (Signed)
   CARE MANAGEMENT NOTE 08/04/2011  Patient:  KEYMON, MCELROY   Account Number:  [id number]  Date Initiated:  08/01/2011  Documentation initiated by:  Lanier Clam  Subjective/Objective Assessment:   ADMITTED W/AMS,HYPERNATREMIA.     Action/Plan:   FROM SNF.   Anticipated DC Date:  08/08/2011   Anticipated DC Plan:  SKILLED NURSING FACILITY  In-house referral  Clinical Social Worker         Choice offered to / List presented to:             Status of service:  In process, will continue to follow Medicare Important Message given?   (If response is "NO", the following Medicare IM given date fields will be blank) Date Medicare IM given:   Date Additional Medicare IM given:    Discharge Disposition:    Per UR Regulation:  Reviewed for med. necessity/level of care/duration of stay  If discussed at Long Length of Stay Meetings, dates discussed:    Comments:  08/04/11 Laressa Bolinger RN,BSN NCM [phone] NOTED NA IMPROVING.D/C SNF WHEN MED STABLE.  08/01/11 Taffany Heiser RN,BSN NCM [phone] CSW FOLLOWING FOR SNF.

## 2011-08-05 LAB — GLUCOSE, CAPILLARY
Glucose-Capillary: 332 mg/dL — ABNORMAL HIGH (ref 70–99)
Glucose-Capillary: 304 mg/dL — ABNORMAL HIGH (ref 70–99)

## 2011-08-05 LAB — BASIC METABOLIC PANEL
BUN: 21 mg/dL (ref 6–23)
Chloride: 112 mEq/L (ref 96–112)
GFR calc non Af Amer: 56 mL/min — ABNORMAL LOW (ref >90)
Glucose, Bld: 393 mg/dL — ABNORMAL HIGH (ref 70–99)
Potassium: 3.1 mEq/L — ABNORMAL LOW (ref 3.5–5.1)
Sodium: 146 mEq/L — ABNORMAL HIGH (ref 135–145)

## 2011-08-05 NOTE — Progress Notes (Addendum)
Patient pulled out IV.  Made two attempts to restart.  Paged IV team and they will add to worklist. Patient is NSL.

## 2011-08-05 NOTE — Progress Notes (Addendum)
paged MD of glucose = 348 with D5 @ 34ml/hr.  MD requested to NSL.

## 2011-08-05 NOTE — Progress Notes (Signed)
Subjective: Very alert today. Asked me how I was doing. No complaints.  Objective: Vital signs in last 24 hours: Temp:  [97.5 F (36.4 C)-98.5 F (36.9 C)] 97.5 F (36.4 C) (03/30 0433) Pulse Rate:  [53-68] 53  (03/30 0700) Resp:  [18-20] 18  (03/30 0433) BP: (157-175)/(84-89) 157/84 mmHg (03/30 0700) SpO2:  [94 %-97 %] 97 % (03/30 0433) Weight:  [99.6 kg (219 lb 9.3 oz)] 99.6 kg (219 lb 9.3 oz) (03/30 0433) Weight change: 1.4 kg (3 lb 1.4 oz) Last BM Date: 08/03/11  Intake/Output from previous day: 03/29 0701 - 03/30 0700 In: 1485 [P.O.:310; I.V.:1175] Out: 2075 [Urine:2075]     Physical Exam: General: Alert, awake, in no acute distress. HEENT: No bruits, no goiter. Heart: Regular rate and rhythm, without murmurs, rubs, gallops. Lungs: Clear to auscultation bilaterally. Abdomen: Soft, nontender, nondistended, positive bowel sounds. Extremities: No clubbing cyanosis or edema with positive pedal pulses.    Lab Results: Basic Metabolic Panel:  Basename 08/04/11 0522 08/03/11 1010  NA 152* 154*  K 3.9 3.2*  CL 118* 120*  CO2 28 26  GLUCOSE 281* 341*  BUN 27* 30*  CREATININE 1.54* 1.49*  CALCIUM 8.2* 7.9*  MG -- --  PHOS -- --   CBC:  Basename 08/03/11 0515  WBC 9.3  NEUTROABS --  HGB 11.9*  HCT 38.4*  MCV 99.5  PLT 304   CBG:  Basename 08/05/11 0733 08/04/11 2143 08/04/11 1700 08/04/11 1211 08/04/11 0742 08/03/11 2044  GLUCAP 234* 335* 309* 387* 231* 383*    Recent Results (from the past 240 hour(s))  CULTURE, BLOOD (ROUTINE X 2)     Status: Normal   Collection Time   07/28/11 10:40 PM      Component Value Range Status Comment   Specimen Description BLOOD LEFT ANTECUBITAL   Final    Special Requests     Final    Value: BOTTLES DRAWN AEROBIC AND ANAEROBIC AERO [facility] ANA 4 CC   Culture  Setup Time [id number]   Final    Culture     Final    Value: PROPIONIBACTERIUM SPECIES     Note: Gram Stain Report Called to,Read Back By and Verified With: [facility]  REYNOLDS @ 1625 08/01/11 WICKN   Report Status 08/03/2011 FINAL   Final   CULTURE, BLOOD (ROUTINE X 2)     Status: Normal   Collection Time   07/28/11 10:45 PM      Component Value Range Status Comment   Specimen Description BLOOD LEFT HAND   Final    Special Requests     Final    Value: BOTTLES DRAWN AEROBIC AND ANAEROBIC AERO 6 CC ANA 4 CC   Culture  Setup Time [id number]   Final    Culture NO GROWTH 5 DAYS   Final    Report Status 08/04/2011 FINAL   Final   URINE CULTURE     Status: Normal   Collection Time   07/28/11 11:23 PM      Component Value Range Status Comment   Specimen Description URINE, CATHETERIZED   Final    Special Requests NONE   Final    Culture  Setup Time [id number]   Final    Colony Count NO GROWTH   Final    Culture NO GROWTH   Final    Report Status 07/30/2011 FINAL   Final   MRSA PCR SCREENING     Status: Normal   Collection Time   07/29/11  2:18 AM  Component Value Range Status Comment   MRSA by PCR NEGATIVE  NEGATIVE  Final     Studies/Results: No results found.  Medications: Scheduled Meds:   . allopurinol  100 mg Oral Daily  . amLODipine  10 mg Oral QPM  . aspirin EC  81 mg Oral Daily  . atenolol  50 mg Oral Daily  . atorvastatin  20 mg Oral QHS  . betaxolol  1 drop Both Eyes BID  . carbamazepine  100 mg Oral Daily  . carbamazepine  200 mg Oral QHS  . cloNIDine  0.2 mg Oral TID  . donepezil  10 mg Oral QHS  . enoxaparin (LOVENOX) injection  40 mg Subcutaneous Q24H  . FLUoxetine  20 mg Oral Daily  . hydrALAZINE  100 mg Oral Q8H  . insulin aspart  0-20 Units Subcutaneous TID WC  . insulin aspart  0-5 Units Subcutaneous QHS  . insulin aspart  5 Units Subcutaneous TID WC  . insulin glargine  20 Units Subcutaneous QHS  . lisinopril  40 mg Oral Daily  . memantine  10 mg Oral BID  . predniSONE  10 mg Oral Q breakfast  . risperiDONE  0.25 mg Oral BID  . senna-docusate  2 tablet Oral QHS  . sodium chloride  3 mL Intravenous Q12H  .  tolterodine  4 mg Oral Daily  . Travoprost (BAK Free)  1 drop Both Eyes QHS  . vitamin C  500 mg Oral BID   Continuous Infusions:   . dextrose 50 mL/hr at 08/04/11 1530   PRN Meds:.acetaminophen, albuterol, food thickener, hydrALAZINE, ipratropium, LORazepam  Assessment/Plan:  Principal Problem:  *Altered mental status Active Problems:  Hypernatremia  Acute kidney injury  Aspiration pneumonia  CKD (chronic kidney disease) stage 2, GFR 60-89 ml/min   #1 Encephalopathy: at baseline. Likely 2/2 hypernatremia on top of baseline dementia.   #2 Hypernatremia:  Suspect this has contributed to his encephalopathy. Continue IVF today. STAT BMET ordered.   #3 Acute on CKD Stage II: Suspect this is a new baseline for him. Cr has been steady in the 1.4-1.6 range despite IVF hydration. Renal US without evidence for obstruction or hydronephrosis. Cr in 5/12 was 1.16.   #4 Dysphagia: Diet as per ST recs.  #5 DIspo: SNF refused to accept over holiday weekend. Plan to return on Monday.    LOS: 8 days   HERNANDEZ ACOSTA,Jasenia Weilbacher [facility] Pager: [phone] 08/05/2011, 9:45 AM

## 2011-08-06 LAB — BASIC METABOLIC PANEL
CO2: 28 mEq/L (ref 19–32)
Calcium: 8.3 mg/dL — ABNORMAL LOW (ref 8.4–10.5)
GFR calc non Af Amer: 47 mL/min — ABNORMAL LOW (ref >90)
Glucose, Bld: 195 mg/dL — ABNORMAL HIGH (ref 70–99)
Potassium: 3.3 mEq/L — ABNORMAL LOW (ref 3.5–5.1)
Sodium: 147 mEq/L — ABNORMAL HIGH (ref 135–145)

## 2011-08-06 LAB — GLUCOSE, CAPILLARY: Glucose-Capillary: 193 mg/dL — ABNORMAL HIGH (ref 70–99)

## 2011-08-06 NOTE — Progress Notes (Signed)
Subjective: Very alert today. No complaints.  Objective: Vital signs in last 24 hours: Temp:  [97.8 F (36.6 C)-99.9 F (37.7 C)] 99.9 F (37.7 C) (03/31 0629) Pulse Rate:  [62-66] 62  (03/31 0629) Resp:  [19-20] 19  (03/31 0629) BP: (146-162)/(72-85) 156/85 mmHg (03/31 0629) SpO2:  [93 %-100 %] 100 % (03/31 0629) Weight:  [97.4 kg (214 lb 11.7 oz)] 97.4 kg (214 lb 11.7 oz) (03/31 0629) Weight change: -2.2 kg (-4 lb 13.6 oz) Last BM Date: 08/03/11  Intake/Output from previous day: 03/30 0701 - 03/31 0700 In: 855.8 [P.O.:660; I.V.:195.8] Out: 2525 [Urine:2525]     Physical Exam: General: Alert, awake, in no acute distress. HEENT: No bruits, no goiter. Heart: Regular rate and rhythm, without murmurs, rubs, gallops. Lungs: Clear to auscultation bilaterally. Abdomen: Soft, nontender, nondistended, positive bowel sounds. Extremities: No clubbing cyanosis or edema with positive pedal pulses.    Lab Results: Basic Metabolic Panel:  Basename 08/06/11 0515 08/05/11 1040  NA 147* 146*  K 3.3* 3.1*  CL 111 112  CO2 28 26  GLUCOSE 195* 393*  BUN 19 21  CREATININE 1.44* 1.26  CALCIUM 8.3* 8.4  MG -- --  PHOS -- --   CBG:  Basename 08/06/11 0726 08/05/11 2134 08/05/11 1638 08/05/11 1137 08/05/11 0733 08/04/11 2143  GLUCAP 193* 304* 332* 348* 234* 335*    Recent Results (from the past 240 hour(s))  CULTURE, BLOOD (ROUTINE X 2)     Status: Normal   Collection Time   07/28/11 10:40 PM      Component Value Range Status Comment   Specimen Description BLOOD LEFT ANTECUBITAL   Final    Special Requests     Final    Value: BOTTLES DRAWN AEROBIC AND ANAEROBIC AERO [facility] ANA 4 CC   Culture  Setup Time [id number]   Final    Culture     Final    Value: PROPIONIBACTERIUM SPECIES     Note: Gram Stain Report Called to,Read Back By and Verified With: [facility] REYNOLDS @ 1625 08/01/11 WICKN   Report Status 08/03/2011 FINAL   Final   CULTURE, BLOOD (ROUTINE X 2)     Status: Normal   Collection Time   07/28/11 10:45 PM      Component Value Range Status Comment   Specimen Description BLOOD LEFT HAND   Final    Special Requests     Final    Value: BOTTLES DRAWN AEROBIC AND ANAEROBIC AERO 6 CC ANA 4 CC   Culture  Setup Time [id number]   Final    Culture NO GROWTH 5 DAYS   Final    Report Status 08/04/2011 FINAL   Final   URINE CULTURE     Status: Normal   Collection Time   07/28/11 11:23 PM      Component Value Range Status Comment   Specimen Description URINE, CATHETERIZED   Final    Special Requests NONE   Final    Culture  Setup Time [id number]   Final    Colony Count NO GROWTH   Final    Culture NO GROWTH   Final    Report Status 07/30/2011 FINAL   Final   MRSA PCR SCREENING     Status: Normal   Collection Time   07/29/11  2:18 AM      Component Value Range Status Comment   MRSA by PCR NEGATIVE  NEGATIVE  Final     Studies/Results: No results found.  Medications: Scheduled Meds:    .  allopurinol  100 mg Oral Daily  . amLODipine  10 mg Oral QPM  . aspirin EC  81 mg Oral Daily  . atenolol  50 mg Oral Daily  . atorvastatin  20 mg Oral QHS  . betaxolol  1 drop Both Eyes BID  . carbamazepine  100 mg Oral Daily  . carbamazepine  200 mg Oral QHS  . cloNIDine  0.2 mg Oral TID  . donepezil  10 mg Oral QHS  . enoxaparin (LOVENOX) injection  40 mg Subcutaneous Q24H  . FLUoxetine  20 mg Oral Daily  . hydrALAZINE  100 mg Oral Q8H  . insulin aspart  0-20 Units Subcutaneous TID WC  . insulin aspart  0-5 Units Subcutaneous QHS  . insulin aspart  5 Units Subcutaneous TID WC  . insulin glargine  20 Units Subcutaneous QHS  . lisinopril  40 mg Oral Daily  . memantine  10 mg Oral BID  . risperiDONE  0.25 mg Oral BID  . senna-docusate  2 tablet Oral QHS  . sodium chloride  3 mL Intravenous Q12H  . tolterodine  4 mg Oral Daily  . Travoprost (BAK Free)  1 drop Both Eyes QHS  . vitamin C  500 mg Oral BID   Continuous Infusions:    . DISCONTD: dextrose 50  mL/hr at 08/05/11 1055   PRN Meds:.acetaminophen, albuterol, food thickener, hydrALAZINE, ipratropium, LORazepam  Assessment/Plan:  Principal Problem:  *Altered mental status Active Problems:  Hypernatremia  Acute kidney injury  Aspiration pneumonia  CKD (chronic kidney disease) stage 2, GFR 60-89 ml/min   #1 Encephalopathy: at baseline. Likely 2/2 hypernatremia on top of baseline dementia.   #2 Hypernatremia:  Suspect this has contributed to his encephalopathy. Na now 147. Encourage PO fluid intake. Now off IVF.   #3 Acute on CKD Stage II: Suspect this is a new baseline for him. Cr has been steady in the 1.4-1.6 range despite IVF hydration. Renal US without evidence for obstruction or hydronephrosis. Cr in 5/12 was 1.16.   #4 Dysphagia: Diet as per ST recs.  #5 DIspo: SNF refused to accept over holiday weekend. Plan to return on Monday.    LOS: 9 days   HERNANDEZ ACOSTA,Hamdan Toscano [facility] Pager: [phone] 08/06/2011, 9:24 AM

## 2011-08-07 LAB — BASIC METABOLIC PANEL
Chloride: 109 mEq/L (ref 96–112)
GFR calc Af Amer: 58 mL/min — ABNORMAL LOW (ref >90)
GFR calc non Af Amer: 50 mL/min — ABNORMAL LOW (ref >90)
Glucose, Bld: 194 mg/dL — ABNORMAL HIGH (ref 70–99)
Potassium: 2.9 mEq/L — ABNORMAL LOW (ref 3.5–5.1)
Sodium: 146 mEq/L — ABNORMAL HIGH (ref 135–145)

## 2011-08-07 LAB — GLUCOSE, CAPILLARY
Glucose-Capillary: 217 mg/dL — ABNORMAL HIGH (ref 70–99)
Glucose-Capillary: 206 mg/dL — ABNORMAL HIGH (ref 70–99)

## 2011-08-07 MED ORDER — FOOD THICKENER (THICKENUP CLEAR): 1.0000 | ORAL | Status: DC | PRN

## 2011-08-07 MED ORDER — POTASSIUM CHLORIDE CRYS ER 20 MEQ PO TBCR
40.0000 meq | EXTENDED_RELEASE_TABLET | ORAL | Status: AC
  Administered 2011-08-07 (×2): 40 meq via ORAL
  Filled 2011-08-07 (×2): qty 2

## 2011-08-07 MED ORDER — ATORVASTATIN CALCIUM 20 MG PO TABS: 20.0000 mg | ORAL_TABLET | Freq: Every day | ORAL | Status: DC

## 2011-08-07 MED ORDER — INSULIN ASPART 100 UNIT/ML ~~LOC~~ SOLN: 5.0000 [IU] | Freq: Three times a day (TID) | SUBCUTANEOUS | Status: DC

## 2011-08-07 MED ORDER — CLONIDINE HCL 0.2 MG PO TABS: 0.3000 mg | ORAL_TABLET | Freq: Three times a day (TID) | ORAL | Status: DC

## 2011-08-07 MED ORDER — INSULIN GLARGINE 100 UNIT/ML ~~LOC~~ SOLN: 20.0000 [IU] | Freq: Two times a day (BID) | SUBCUTANEOUS | Status: DC

## 2011-08-07 MED ORDER — HYDRALAZINE HCL 100 MG PO TABS: 100.0000 mg | ORAL_TABLET | Freq: Three times a day (TID) | ORAL | Status: DC

## 2011-08-07 NOTE — Progress Notes (Signed)
Patient seen and examined. Agree with note by Toya Smothers, NP. Please see addendum to DC Summary for details.  Peggye Pitt, MD [facility] Pager: [phone]

## 2011-08-07 NOTE — Discharge Summary (Addendum)
Physician Discharge Summary  Patient ID: BUCKLEY BRADLY MRN: [id number] DOB/AGE: 1940-10-24 71 y.o.  Admit date: 07/28/2011 Discharge date: 08/07/2011  Primary Care Physician:  No primary provider on file.   Discharge Diagnoses:    Principal Problem:  *Altered mental status, resolved Active Problems:  Hypernatremia, improved  Acute kidney injury  Aspiration pneumonia  CKD (chronic kidney disease) stage 2, GFR 60-89 ml/min  Dementia  Dysphagia   Medication List  As of 08/07/2011  2:03 PM   STOP taking these medications         lisinopril 40 MG tablet      metFORMIN 1000 MG tablet         TAKE these medications         allopurinol 100 MG tablet   Commonly known as: ZYLOPRIM   Take 100 mg by mouth daily.      amLODipine 10 MG tablet   Commonly known as: NORVASC   Take 10 mg by mouth every evening. Hold for SBP<110      aspirin 81 MG tablet   Take 81 mg by mouth daily.      atenolol 50 MG tablet   Commonly known as: TENORMIN   Take 50 mg by mouth daily.      atorvastatin 20 MG tablet   Commonly known as: LIPITOR   Take 1 tablet (20 mg total) by mouth at bedtime.      AZO-CRANBERRY PO   Take 475 mg by mouth 2 (two) times daily.      betaxolol 0.25 % ophthalmic suspension   Commonly known as: BETOPTIC-S   Place 1 drop into both eyes 2 (two) times daily.      carbamazepine 100 MG chewable tablet   Commonly known as: TEGRETOL   Chew 100-200 mg by mouth 2 (two) times daily. Takes 100 mg in the am and 200 mg every evening      cloNIDine 0.2 MG tablet   Commonly known as: CATAPRES   Take 1.5 tablets (0.3 mg total) by mouth 3 (three) times daily. Hold for SBP<110      donepezil 10 MG tablet   Commonly known as: ARICEPT   Take 10 mg by mouth at bedtime.      FLUoxetine 20 MG tablet   Commonly known as: PROZAC   Take 20 mg by mouth daily.      food thickener Powd   Commonly known as: RESOURCE THICKENUP CLEAR   Take 120 g by mouth as needed.     hydrALAZINE 100 MG tablet   Commonly known as: APRESOLINE   Take 1 tablet (100 mg total) by mouth every 8 (eight) hours.      insulin aspart 100 UNIT/ML injection   Commonly known as: novoLOG   Inject into the skin 3 (three) times daily before meals. Sliding scale  70-120 no insulin 121-150=3 units 151-200=4 units 201-250=7 units 251-300=11 units 351-400=20 units      insulin aspart 100 UNIT/ML injection   Commonly known as: novoLOG   Inject 5 Units into the skin 3 (three) times daily with meals.      insulin glargine 100 UNIT/ML injection   Commonly known as: LANTUS   Inject 20 Units into the skin 2 (two) times daily.      memantine 10 MG tablet   Commonly known as: NAMENDA   Take 10 mg by mouth 2 (two) times daily.      risperiDONE 0.25 MG tablet   Commonly known as: RISPERDAL  Take 0.25 mg by mouth 2 (two) times daily.      sennosides-docusate sodium 8.6-50 MG tablet   Commonly known as: SENOKOT-S   Take 2 tablets by mouth at bedtime.      simvastatin 40 MG tablet   Commonly known as: ZOCOR   Take 40 mg by mouth at bedtime.      tolterodine 4 MG 24 hr capsule   Commonly known as: DETROL LA   Take 4 mg by mouth daily.      travoprost (benzalkonium) 0.004 % ophthalmic solution   Commonly known as: TRAVATAN   Place 1 drop into both eyes at bedtime.      vitamin C 500 MG tablet   Commonly known as: ASCORBIC ACID   Take 500 mg by mouth 2 (two) times daily.             Disposition and Follow-up: Pt is medically stable and ready for discharge to facility. Will need apt with PCP in 1 week.  Consults:  None  Physical exam: see progress note dated 08/07/11   Significant Diagnostic Studies:  Dg Chest 1 View  07/28/2011  *RADIOLOGY REPORT*  Clinical Data: Fever, altered mental status, possible fall, history carcinoma of the colon, dementia, hypertension, diabetes, CHF, stroke  CHEST - 1 VIEW  Comparison: 08/15/2008  Findings: Minimal enlargement of cardiac  silhouette. Tortuous aorta. Prior vascular congestion. Minimal right basilar atelectasis. Slight accentuation of perihilar interstitial markings since previous exam could reflect minimal failure. No segmental consolidation or pleural effusion. No pneumothorax. Bones unremarkable.  IMPRESSION: Enlargement of cardiac silhouette with pulmonary vascular congestion. Minimal right basilar atelectasis. Cannot exclude minimal failure.  Original Report Authenticated By: Lollie Marrow, M.D.   Dg Pelvis 1-2 Views  07/28/2011  *RADIOLOGY REPORT*  Clinical Data: Altered mental status  PELVIS - 1-2 VIEW  Comparison: None  Findings: Osseous demineralization. Minimal narrowing of hip joints bilaterally. SI joints symmetric preserved. No acute fracture, dislocation, or bone destruction. Scattered atherosclerotic calcifications of numerous pelvic phleboliths. Mild gaseous distention of colon through the rectum with a stool ball in rectum.  IMPRESSION: Osseous demineralization with minimal degenerative changes of the hip joints. No acute bony abnormalities.  Original Report Authenticated By: Lollie Marrow, M.D.   Dg Shoulder Right  07/28/2011  *RADIOLOGY REPORT*  Clinical Data: Fever, possible fall, altered mental status  RIGHT SHOULDER - 2+ VIEW  Comparison: None  Findings: Osseous demineralization. AC joint alignment normal. No acute fracture, dislocation, or bone destruction. Visualized right ribs intact.  IMPRESSION: No acute bony abnormalities.  Original Report Authenticated By: Lollie Marrow, M.D.   Ct Head Wo Contrast  07/28/2011  *RADIOLOGY REPORT*  Clinical Data:  Multiple falls.  Head and neck injury.  Altered mental status. Colon cancer.  CT HEAD WITHOUT CONTRAST CT CERVICAL SPINE WITHOUT CONTRAST  Technique:  Multidetector CT imaging of the head and cervical spine was performed following the standard protocol without intravenous contrast.  Multiplanar CT image reconstructions of the cervical spine were also  generated.  Comparison:  Head CT on 11/19/2008  CT HEAD  Findings: There is no evidence of intracranial hemorrhage, brain edema or other signs of acute infarction.  There is no evidence of intracranial mass lesion or mass effect.  No abnormal extra-axial fluid collections are identified.  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  Multiple lacunar infarcts and chronic small vessel disease are again demonstrated as well as an old left  cerebellar infarct.  Moderate cerebral atrophy is also stable. Heavy intracranial atherosclerotic calcification is noted.  No evidence of skull fracture or other bone abnormality.  IMPRESSION:  1.  No acute intracranial abnormality. 2.  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  CT CERVICAL SPINE  Findings: No evidence of cervical spine fracture or subluxation. Severe degenerative disc disease is seen at C4-5 and C6-7.  Mild facet DJD is seen bilaterally.  Atlantoaxial degenerative changes also noted.  IMPRESSION:  1.  No evidence of cervical spine fracture or subluxation. 2.  Degenerative cervical spondylosis.  Original Report Authenticated By: Danae Orleans, M.D.   Ct Cervical Spine Wo Contrast  07/28/2011  *RADIOLOGY REPORT*  Clinical Data:  Multiple falls.  Head and neck injury.  Altered mental status. Colon cancer.  CT HEAD WITHOUT CONTRAST CT CERVICAL SPINE WITHOUT CONTRAST  Technique:  Multidetector CT imaging of the head and cervical spine was performed following the standard protocol without intravenous contrast.  Multiplanar CT image reconstructions of the cervical spine were also generated.  Comparison:  Head CT on 11/19/2008  CT HEAD  Findings: There is no evidence of intracranial hemorrhage, brain edema or other signs of acute infarction.  There is no evidence of intracranial mass lesion or mass effect.  No abnormal extra-axial fluid  collections are identified.  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  Multiple lacunar infarcts and chronic small vessel disease are again demonstrated as well as an old left cerebellar infarct.  Moderate cerebral atrophy is also stable. Heavy intracranial atherosclerotic calcification is noted.  No evidence of skull fracture or other bone abnormality.  IMPRESSION:  1.  No acute intracranial abnormality. 2.  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  CT CERVICAL SPINE  Findings: No evidence of cervical spine fracture or subluxation. Severe degenerative disc disease is seen at C4-5 and C6-7.  Mild facet DJD is seen bilaterally.  Atlantoaxial degenerative changes also noted.  IMPRESSION:  1.  No evidence of cervical spine fracture or subluxation. 2.  Degenerative cervical spondylosis.  Original Report Authenticated By: Danae Orleans, M.D.    Labs Reviewed  URINALYSIS, ROUTINE W REFLEX MICROSCOPIC - Abnormal; Notable for the following:    APPearance CLOUDY (*)    Specific Gravity, Urine 1.032 (*)    Hgb urine dipstick MODERATE (*)    Protein, ur >300 (*)    All other components within normal limits  POCT I-STAT, CHEM 8 - Abnormal; Notable for the following:    Sodium 162 (*)    Chloride 123 (*)    BUN 40 (*)    Creatinine, Ser 1.70 (*)    Glucose, Bld 148 (*)    Calcium, Ion 1.08 (*)    All other components within normal limits  COMPREHENSIVE METABOLIC PANEL - Abnormal; Notable for the following:    Sodium 156 (*)    Chloride 117 (*)    Glucose, Bld 141 (*)    BUN 37 (*)    Creatinine, Ser 1.73 (*)    Total Protein 9.1 (*)    Albumin 2.9 (*)    AST 41 (*) NO VISIBLE HEMOLYSIS   GFR calc non Af Amer 38 (*)    GFR calc Af Amer 44 (*)    All other components within normal limits  URINE MICROSCOPIC-ADD ON - Abnormal; Notable  for the following:    Casts  GRANULAR CAST (*)    All other components within normal limits  CBC - Abnormal; Notable for the following:    RBC 4.13 (*)    Hemoglobin 12.8 (*)    MCV 100.7 (*)    All other components within normal limits  COMPREHENSIVE METABOLIC PANEL - Abnormal; Notable for the following:    Sodium 156 (*)    Chloride 121 (*)    Glucose, Bld 165 (*)    BUN 35 (*)    Creatinine, Ser 1.67 (*)    Calcium 7.6 (*)    Albumin 2.5 (*)    GFR calc non Af Amer 40 (*)    GFR calc Af Amer 46 (*)    All other components within normal limits  DIFFERENTIAL - Abnormal; Notable for the following:    Neutrophils Relative 79 (*)    Monocytes Relative 2 (*)    All other components within normal limits  APTT - Abnormal; Notable for the following:    aPTT 40 (*)    All other components within normal limits  PROTIME-INR - Abnormal; Notable for the following:    Prothrombin Time 16.6 (*)    All other components within normal limits  PRO B NATRIURETIC PEPTIDE - Abnormal; Notable for the following:    Pro B Natriuretic peptide (BNP) 396.6 (*)    All other components within normal limits  CARDIAC PANEL(CRET KIN+CKTOT+MB+TROPI) - Abnormal; Notable for the following:    Total CK 514 (*)    All other components within normal limits  CARDIAC PANEL(CRET KIN+CKTOT+MB+TROPI) - Abnormal; Notable for the following:    Total CK 587 (*)    All other components within normal limits  CARDIAC PANEL(CRET KIN+CKTOT+MB+TROPI) - Abnormal; Notable for the following:    Total CK 601 (*)    All other components within normal limits  HEMOGLOBIN A1C - Abnormal; Notable for the following:    Hemoglobin A1C 6.3 (*)    Mean Plasma Glucose 134 (*)    All other components within normal limits  COMPREHENSIVE METABOLIC PANEL - Abnormal; Notable for the following:    Sodium 155 (*)    Chloride 121 (*)    Glucose, Bld 191 (*)    BUN 36 (*)    Creatinine, Ser 1.60 (*)    Calcium 7.8 (*)    Albumin 2.6 (*)    GFR calc non Af Amer 42  (*)    GFR calc Af Amer 48 (*)    All other components within normal limits  CBC - Abnormal; Notable for the following:    RBC 4.16 (*)    All other components within normal limits  CREATININE, URINE, 24 HOUR - Abnormal; Notable for the following:    Creatinine, 24H Ur 2148 (*)    All other components within normal limits  GLUCOSE, CAPILLARY - Abnormal; Notable for the following:    Glucose-Capillary 169 (*)    All other components within normal limits  GLUCOSE, CAPILLARY - Abnormal; Notable for the following:    Glucose-Capillary 195 (*)    All other components within normal limits  BASIC METABOLIC PANEL - Abnormal; Notable for the following:    Sodium 156 (*)    Chloride 120 (*)    Glucose, Bld 196 (*)    BUN 34 (*)    Creatinine, Ser 1.54 (*)    Calcium 7.9 (*)    GFR calc non Af Amer 44 (*)    GFR  calc Af Amer 51 (*)    All other components within normal limits  BASIC METABOLIC PANEL - Abnormal; Notable for the following:    Sodium 155 (*)    Chloride 122 (*)    Glucose, Bld 276 (*)    BUN 39 (*)    Creatinine, Ser 1.75 (*)    Calcium 7.8 (*)    GFR calc non Af Amer 37 (*)    GFR calc Af Amer 43 (*)    All other components within normal limits  GLUCOSE, CAPILLARY - Abnormal; Notable for the following:    Glucose-Capillary 166 (*)    All other components within normal limits  GLUCOSE, CAPILLARY - Abnormal; Notable for the following:    Glucose-Capillary 251 (*)    All other components within normal limits  BASIC METABOLIC PANEL - Abnormal; Notable for the following:    Sodium 156 (*)    Chloride 121 (*)    Glucose, Bld 280 (*)    BUN 37 (*)    Creatinine, Ser 1.68 (*)    Calcium 7.7 (*)    GFR calc non Af Amer 39 (*)    GFR calc Af Amer 46 (*)    All other components within normal limits  GLUCOSE, CAPILLARY - Abnormal; Notable for the following:    Glucose-Capillary 301 (*)    All other components within normal limits  CBC - Abnormal; Notable for the  following:    RBC 3.85 (*)    Hemoglobin 12.0 (*)    HCT 38.3 (*)    All other components within normal limits  BASIC METABOLIC PANEL - Abnormal; Notable for the following:    Sodium 155 (*)    Chloride 121 (*)    Glucose, Bld 327 (*)    BUN 39 (*)    Creatinine, Ser 1.74 (*)    Calcium 7.9 (*)    GFR calc non Af Amer 38 (*)    GFR calc Af Amer 44 (*)    All other components within normal limits  GLUCOSE, CAPILLARY - Abnormal; Notable for the following:    Glucose-Capillary 256 (*)    All other components within normal limits  GLUCOSE, CAPILLARY - Abnormal; Notable for the following:    Glucose-Capillary 279 (*)    All other components within normal limits  GLUCOSE, CAPILLARY - Abnormal; Notable for the following:    Glucose-Capillary 238 (*)    All other components within normal limits  GLUCOSE, CAPILLARY - Abnormal; Notable for the following:    Glucose-Capillary 384 (*)    All other components within normal limits  BASIC METABOLIC PANEL - Abnormal; Notable for the following:    Sodium 154 (*)    Chloride 120 (*)    Glucose, Bld 348 (*)    BUN 34 (*)    Creatinine, Ser 1.75 (*)    Calcium 7.8 (*)    GFR calc non Af Amer 37 (*)    GFR calc Af Amer 43 (*)    All other components within normal limits  GLUCOSE, CAPILLARY - Abnormal; Notable for the following:    Glucose-Capillary 283 (*)    All other components within normal limits  BASIC METABOLIC PANEL - Abnormal; Notable for the following:    Sodium 152 (*)    Chloride 118 (*)    Glucose, Bld 420 (*)    BUN 36 (*)    Creatinine, Ser 1.63 (*)    Calcium 7.8 (*)    GFR calc non  Af Amer 41 (*)    GFR calc Af Amer 47 (*)    All other components within normal limits  GLUCOSE, CAPILLARY - Abnormal; Notable for the following:    Glucose-Capillary 309 (*)    All other components within normal limits  GLUCOSE, CAPILLARY - Abnormal; Notable for the following:    Glucose-Capillary 361 (*)    All other components within  normal limits  GLUCOSE, CAPILLARY - Abnormal; Notable for the following:    Glucose-Capillary 375 (*)    All other components within normal limits  BASIC METABOLIC PANEL - Abnormal; Notable for the following:    Sodium 157 (*)    Potassium 3.3 (*)    Chloride 123 (*)    Glucose, Bld 297 (*)    BUN 33 (*)    Creatinine, Ser 1.83 (*)    Calcium 7.8 (*)    GFR calc non Af Amer 35 (*)    GFR calc Af Amer 41 (*)    All other components within normal limits  BASIC METABOLIC PANEL - Abnormal; Notable for the following:    Sodium 157 (*)    Chloride 122 (*)    Glucose, Bld 399 (*)    BUN 36 (*)    Creatinine, Ser 1.76 (*)    Calcium 8.3 (*)    GFR calc non Af Amer 37 (*)    GFR calc Af Amer 43 (*)    All other components within normal limits  GLUCOSE, CAPILLARY - Abnormal; Notable for the following:    Glucose-Capillary 284 (*)    All other components within normal limits  GLUCOSE, CAPILLARY - Abnormal; Notable for the following:    Glucose-Capillary 244 (*)    All other components within normal limits  GLUCOSE, CAPILLARY - Abnormal; Notable for the following:    Glucose-Capillary 237 (*)    All other components within normal limits  BASIC METABOLIC PANEL - Abnormal; Notable for the following:    Sodium 158 (*)    Chloride 123 (*)    Glucose, Bld 259 (*)    BUN 35 (*)    Creatinine, Ser 1.86 (*)    Calcium 8.2 (*)    GFR calc non Af Amer 35 (*)    GFR calc Af Amer 40 (*)    All other components within normal limits  CBC - Abnormal; Notable for the following:    RBC 4.13 (*)    Hemoglobin 12.4 (*)    All other components within normal limits  BASIC METABOLIC PANEL - Abnormal; Notable for the following:    Sodium 158 (*)    Chloride 125 (*)    Glucose, Bld 348 (*)    BUN 38 (*)    Creatinine, Ser 1.93 (*)    Calcium 7.9 (*)    GFR calc non Af Amer 33 (*)    GFR calc Af Amer 39 (*)    All other components within normal limits  BASIC METABOLIC PANEL - Abnormal; Notable  for the following:    Sodium 153 (*)    Chloride 119 (*)    Glucose, Bld 452 (*)    BUN 37 (*)    Creatinine, Ser 1.76 (*)    Calcium 7.8 (*)    GFR calc non Af Amer 37 (*)    GFR calc Af Amer 43 (*)    All other components within normal limits  GLUCOSE, CAPILLARY - Abnormal; Notable for the following:    Glucose-Capillary 296 (*)  All other components within normal limits  BASIC METABOLIC PANEL - Abnormal; Notable for the following:    Sodium 155 (*)    Chloride 123 (*)    Glucose, Bld 330 (*)    BUN 34 (*)    Creatinine, Ser 1.65 (*)    Calcium 7.8 (*)    GFR calc non Af Amer 40 (*)    GFR calc Af Amer 47 (*)    All other components within normal limits  GLUCOSE, CAPILLARY - Abnormal; Notable for the following:    Glucose-Capillary 293 (*)    All other components within normal limits  GLUCOSE, CAPILLARY - Abnormal; Notable for the following:    Glucose-Capillary 389 (*)    All other components within normal limits  BASIC METABOLIC PANEL - Abnormal; Notable for the following:    Sodium 155 (*)    Potassium 3.2 (*)    Chloride 121 (*)    Glucose, Bld 264 (*)    BUN 33 (*)    Creatinine, Ser 1.71 (*)    Calcium 8.0 (*)    GFR calc non Af Amer 38 (*)    GFR calc Af Amer 45 (*)    All other components within normal limits  CBC - Abnormal; Notable for the following:    RBC 3.86 (*)    Hemoglobin 11.9 (*)    HCT 38.4 (*)    All other components within normal limits  GLUCOSE, CAPILLARY - Abnormal; Notable for the following:    Glucose-Capillary 321 (*)    All other components within normal limits  BASIC METABOLIC PANEL - Abnormal; Notable for the following:    Sodium 154 (*)    Potassium 3.2 (*)    Chloride 120 (*)    Glucose, Bld 341 (*)    BUN 30 (*)    Creatinine, Ser 1.49 (*)    Calcium 7.9 (*)    GFR calc non Af Amer 45 (*)    GFR calc Af Amer 53 (*)    All other components within normal limits  GLUCOSE, CAPILLARY - Abnormal; Notable for the following:     Glucose-Capillary 336 (*)    All other components within normal limits  GLUCOSE, CAPILLARY - Abnormal; Notable for the following:    Glucose-Capillary 267 (*)    All other components within normal limits  GLUCOSE, CAPILLARY - Abnormal; Notable for the following:    Glucose-Capillary 347 (*)    All other components within normal limits  BASIC METABOLIC PANEL - Abnormal; Notable for the following:    Sodium 152 (*)    Chloride 118 (*)    Glucose, Bld 281 (*)    BUN 27 (*)    Creatinine, Ser 1.54 (*)    Calcium 8.2 (*)    GFR calc non Af Amer 44 (*)    GFR calc Af Amer 51 (*)    All other components within normal limits  GLUCOSE, CAPILLARY - Abnormal; Notable for the following:    Glucose-Capillary 379 (*)    All other components within normal limits  GLUCOSE, CAPILLARY - Abnormal; Notable for the following:    Glucose-Capillary 383 (*)    All other components within normal limits  GLUCOSE, CAPILLARY - Abnormal; Notable for the following:    Glucose-Capillary 231 (*)    All other components within normal limits  GLUCOSE, CAPILLARY - Abnormal; Notable for the following:    Glucose-Capillary 387 (*)    All other components within normal limits  GLUCOSE,  CAPILLARY - Abnormal; Notable for the following:    Glucose-Capillary 309 (*)    All other components within normal limits  GLUCOSE, CAPILLARY - Abnormal; Notable for the following:    Glucose-Capillary 335 (*)    All other components within normal limits  GLUCOSE, CAPILLARY - Abnormal; Notable for the following:    Glucose-Capillary 234 (*)    All other components within normal limits  BASIC METABOLIC PANEL - Abnormal; Notable for the following:    Sodium 146 (*)    Potassium 3.1 (*)    Glucose, Bld 393 (*)    GFR calc non Af Amer 56 (*)    GFR calc Af Amer 65 (*)    All other components within normal limits  GLUCOSE, CAPILLARY - Abnormal; Notable for the following:    Glucose-Capillary 348 (*)    All other components  within normal limits  GLUCOSE, CAPILLARY - Abnormal; Notable for the following:    Glucose-Capillary 332 (*)    All other components within normal limits  BASIC METABOLIC PANEL - Abnormal; Notable for the following:    Sodium 147 (*)    Potassium 3.3 (*)    Glucose, Bld 195 (*)    Creatinine, Ser 1.44 (*)    Calcium 8.3 (*)    GFR calc non Af Amer 47 (*)    GFR calc Af Amer 55 (*)    All other components within normal limits  GLUCOSE, CAPILLARY - Abnormal; Notable for the following:    Glucose-Capillary 304 (*)    All other components within normal limits  GLUCOSE, CAPILLARY - Abnormal; Notable for the following:    Glucose-Capillary 193 (*)    All other components within normal limits  GLUCOSE, CAPILLARY - Abnormal; Notable for the following:    Glucose-Capillary 277 (*)    All other components within normal limits  GLUCOSE, CAPILLARY - Abnormal; Notable for the following:    Glucose-Capillary 327 (*)    All other components within normal limits  BASIC METABOLIC PANEL - Abnormal; Notable for the following:    Sodium 146 (*)    Potassium 2.9 (*)    Glucose, Bld 194 (*)    Creatinine, Ser 1.38 (*)    Calcium 8.3 (*)    GFR calc non Af Amer 50 (*)    GFR calc Af Amer 58 (*)    All other components within normal limits  GLUCOSE, CAPILLARY - Abnormal; Notable for the following:    Glucose-Capillary 327 (*)    All other components within normal limits  GLUCOSE, CAPILLARY - Abnormal; Notable for the following:    Glucose-Capillary 217 (*)    All other components within normal limits  GLUCOSE, CAPILLARY - Abnormal; Notable for the following:    Glucose-Capillary 206 (*)    All other components within normal limits  CBC  DIFFERENTIAL  URINE CULTURE  CULTURE, BLOOD (ROUTINE X 2)  CULTURE, BLOOD (ROUTINE X 2)  LACTIC ACID, PLASMA  PROCALCITONIN  TROPONIN I  MAGNESIUM  PHOSPHORUS  TSH  OSMOLALITY, URINE  SODIUM, URINE, RANDOM  MRSA PCR SCREENING  VANCOMYCIN, TROUGH         Dg Chest 1 View  07/28/2011  *RADIOLOGY REPORT*  Clinical Data: Fever, altered mental status, possible fall, history carcinoma of the colon, dementia, hypertension, diabetes, CHF, stroke  CHEST - 1 VIEW  Comparison: 08/15/2008  Findings: Minimal enlargement of cardiac silhouette. Tortuous aorta. Prior vascular congestion. Minimal right basilar atelectasis. Slight accentuation of perihilar interstitial markings since previous  exam could reflect minimal failure. No segmental consolidation or pleural effusion. No pneumothorax. Bones unremarkable.  IMPRESSION: Enlargement of cardiac silhouette with pulmonary vascular congestion. Minimal right basilar atelectasis. Cannot exclude minimal failure.  Original Report Authenticated By: Lollie Marrow, M.D.   Dg Pelvis 1-2 Views  07/28/2011  *RADIOLOGY REPORT*  Clinical Data: Altered mental status  PELVIS - 1-2 VIEW  Comparison: None  Findings: Osseous demineralization. Minimal narrowing of hip joints bilaterally. SI joints symmetric preserved. No acute fracture, dislocation, or bone destruction. Scattered atherosclerotic calcifications of numerous pelvic phleboliths. Mild gaseous distention of colon through the rectum with a stool ball in rectum.  IMPRESSION: Osseous demineralization with minimal degenerative changes of the hip joints. No acute bony abnormalities.  Original Report Authenticated By: Lollie Marrow, M.D.   Dg Shoulder Right  07/28/2011  *RADIOLOGY REPORT*  Clinical Data: Fever, possible fall, altered mental status  RIGHT SHOULDER - 2+ VIEW  Comparison: None  Findings: Osseous demineralization. AC joint alignment normal. No acute fracture, dislocation, or bone destruction. Visualized right ribs intact.  IMPRESSION: No acute bony abnormalities.  Original Report Authenticated By: Lollie Marrow, M.D.   Ct Head Wo Contrast  07/28/2011  *RADIOLOGY REPORT*  Clinical Data:  Multiple falls.  Head and neck injury.  Altered mental status. Colon cancer.  CT  HEAD WITHOUT CONTRAST CT CERVICAL SPINE WITHOUT CONTRAST  Technique:  Multidetector CT imaging of the head and cervical spine was performed following the standard protocol without intravenous contrast.  Multiplanar CT image reconstructions of the cervical spine were also generated.  Comparison:  Head CT on 11/19/2008  CT HEAD  Findings: There is no evidence of intracranial hemorrhage, brain edema or other signs of acute infarction.  There is no evidence of intracranial mass lesion or mass effect.  No abnormal extra-axial fluid collections are identified.  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  Multiple lacunar infarcts and chronic small vessel disease are again demonstrated as well as an old left cerebellar infarct.  Moderate cerebral atrophy is also stable. Heavy intracranial atherosclerotic calcification is noted.  No evidence of skull fracture or other bone abnormality.  IMPRESSION:  1.  No acute intracranial abnormality. 2.  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  CT CERVICAL SPINE  Findings: No evidence of cervical spine fracture or subluxation. Severe degenerative disc disease is seen at C4-5 and C6-7.  Mild facet DJD is seen bilaterally.  Atlantoaxial degenerative changes also noted.  IMPRESSION:  1.  No evidence of cervical spine fracture or subluxation. 2.  Degenerative cervical spondylosis.  Original Report Authenticated By: Danae Orleans, M.D.   Ct Cervical Spine Wo Contrast  07/28/2011  *RADIOLOGY REPORT*  Clinical Data:  Multiple falls.  Head and neck injury.  Altered mental status. Colon cancer.  CT HEAD WITHOUT CONTRAST CT CERVICAL SPINE WITHOUT CONTRAST  Technique:  Multidetector CT imaging of the head and cervical spine was performed following the standard protocol without intravenous contrast.  Multiplanar CT image reconstructions of the cervical spine were  also generated.  Comparison:  Head CT on 11/19/2008  CT HEAD  Findings: There is no evidence of intracranial hemorrhage, brain edema or other signs of acute infarction.  There is no evidence of intracranial mass lesion or mass effect.  No abnormal extra-axial fluid collections are identified.  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  Multiple lacunar infarcts and chronic small vessel disease are again demonstrated as well as an old left cerebellar infarct.  Moderate cerebral atrophy is also stable. Heavy intracranial atherosclerotic calcification is noted.  No evidence of skull fracture or other bone abnormality.  IMPRESSION:  1.  No acute intracranial abnormality. 2.  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  CT CERVICAL SPINE  Findings: No evidence of cervical spine fracture or subluxation. Severe degenerative disc disease is seen at C4-5 and C6-7.  Mild facet DJD is seen bilaterally.  Atlantoaxial degenerative changes also noted.  IMPRESSION:  1.  No evidence of cervical spine fracture or subluxation. 2.  Degenerative cervical spondylosis.  Original Report Authenticated By: Danae Orleans, M.D.   US Renal  08/02/2011  *RADIOLOGY REPORT*  Clinical Data: Acute renal failure which is not improving during IV hydration.  History of colon cancer.  RENAL/URINARY TRACT ULTRASOUND COMPLETE 08/02/2011:  Comparison:  PET CT 09/06/2009, 09/10/2008.  Findings:  Right Kidney:  No hydronephrosis.  Echogenic parenchyma consistent with medical renal disease.  Well-preserved cortex.  No focal parenchymal abnormality.  No visible shadowing calculi. Approximately 12.2 cm in length.  Left Kidney:  No hydronephrosis.  Echogenic parenchyma consistent with medical renal disease.  Well-preserved cortex.  Approximate 1.3 cm parapelvic cyst.  No significant focal parenchymal abnormality.  No visible  shadowing calculi; an echogenic focus arising from the lower pole was shown on prior CT to represent vascular calcification.  Approximately 12.7 cm in length.  Bladder:  Decompressed by Foley catheter.  Other findings:  Multiple shadowing gallstones in the gallbladder.  IMPRESSION:  1.  No evidence of hydronephrosis involving either kidney to suggest obstruction. 2.  Echogenic kidneys bilaterally consistent with medical renal disease. 3.  Cholelithiasis.  Original Report Authenticated By: Arnell Sieving, M.D.       Brief H and P: For complete details please refer to admission H and P, but in brief  71 year old male from NH with multiple co morbidities who presented to [facility] ED on 07/29/11 with altered mental status as per family member. Due to patient's mental status at presentation,  unable to obtain history from him and no family  present at bedside so history was obtained from ED chart and ED physician. As per ED MD patient appeared to be increasingly confused over last few days with occasional coughs and family was concerned that he may have aspirated because on occasion there would be food coming from his mouth. In regards to patient's confusion he appeared to be disoriented and although he does have an underlying dementia he was somewhat verbal but over last few days his speech has deteriorated. There was no fever or chills reported in NH, no abdominal pain, no nausea or vomiting. Work up in the ED yields AMS with neg head CT, hypernatremia, acute kidney injury, dehydration and concern for aspiration pneumonia. Pt admitted to tele    Hospital Course:   Principal Problem:  *Altered mental status Active Problems:  Hypernatremia  Acute kidney injury  Aspiration pneumonia  CKD (chronic kidney disease) stage 2, GFR 60-89 ml/min   #1 Encephalopathy: Better than baseline per niece, Talbert Forest. Likely multifactorial i.e. Hypernatremia on top of baseline dementia.  Pt admitted to tele. Provided with  IV fluids, 02 support, antibiotics. CT of head and chest xray as above.  Pt gradually improved to beyond baseline per family. At time of discharge pt  alert, responds appropriately to questions/follows commands.    #2 Hypernatremia: managed with IV fluids, initially NS then D5W. Urine studies as above.  Na gradually improved during hospitalization. Problem #1 improved and pt began taking po and sodium improved markedly. At time of discharge, pt has not had IV fluids for 3 days and sodium 146. Recommend facility staff offer pt po fluids hourly while awake.    #3 Acute on CKD Stage II:  Did not improve despite IVF.Renal ultrasound as above. During hospitalization creatine range 1.26-1.54. Urine output WNL. I suspect this is a new baseline for him. Has been taken off lisinopril.  #4 ??Aspiration PNA: No evidence for this seen on initial CXR. Will strike diagnosis from problem list, taper steroids. Discontinue antibiotics. Pt remained afebrile with unremarkable WC during this hospitalization. Blood cultures as above. See #6.   #5 Dysphagia: Pt seen by ST several times. On dysphagia diet  and thickener per ST recs.   #6 Bacteremia: 1/2 Blood cultures with propionobacterium. Likely a contaminant,  Pt on Vanc from 07/29/11 until 08/04/11. Afebrile, non toxic. No further antibiotics required.  #7 HTN: off lisinopril 2/2 #3. Hydralazine and clonidine doses increased.  #8 DM: Is on much lower insulin doses than was on pre-admission. Continue to follow CBGs and adjust accordingly.  Time spent on Discharge: 45 min  Signed: Gwenyth Bender 08/07/2011, 2:03 PM

## 2011-08-07 NOTE — Progress Notes (Signed)
Physical Therapy Treatment Patient Details Name: [name] MRN: [id number] DOB: Jun 21, 1940 Today's Date: 08/07/2011  PT Assessment/Plan  PT - Assessment/Plan Comments on Treatment Session: Patient more alert and participative with therapy.  Able to stand and pivot with +2 assist and cues for posture, sequence with stepping to chair. PT Plan: Discharge plan remains appropriate PT Frequency: Min 2X/week Follow Up Recommendations: Skilled nursing facility Equipment Recommended: Defer to next venue PT Goals  Acute Rehab PT Goals Pt will go Supine/Side to Sit: with mod assist PT Goal: Supine/Side to Sit - Progress: Progressing toward goal Pt will Sit at [facility] of Bed: 3-5 min;with unilateral upper extremity support;with supervision PT Goal: Sit at Edge Of Bed - Progress: Progressing toward goal Pt will go Sit to Stand: with mod assist;with upper extremity assist PT Goal: Sit to Stand - Progress: Progressing toward goal Pt will go Stand to Sit: with supervision PT Goal: Stand to Sit - Progress: Progressing toward goal Pt will Transfer Bed to Chair/Chair to Bed: with min assist PT Transfer Goal: Bed to Chair/Chair to Bed - Progress: Progressing toward goal  PT Treatment Precautions/Restrictions  Precautions Precautions: Fall Precaution Comments: D1; nectar liquids Restrictions Weight Bearing Restrictions: No Mobility (including Balance) Bed Mobility Supine to Sit: 2: Max assist;HOB elevated (Comment degrees) Supine to Sit Details (indicate cue type and reason): HOB 80*, assist for each leg over edge of bed with cues, and to lift trunk to sit upright Transfers Sit to Stand: 1: +2 Total assist;From bed;With upper extremity assist Sit to Stand Details (indicate cue type and reason): pt=20-30% with cues and increased time for upright posture Stand to Sit: 1: +2 Total assist;To chair/3-in-1 Stand to Sit Details: for safety due to patient sitting prior to being close enough to  chair Ambulation/Gait Ambulation/Gait Assistance: 1: +2 Total assist Ambulation/Gait Assistance Details (indicate cue type and reason): pt=40%; assist and cues for upright posture, to take steps to pivot to chair Ambulation Distance (Feet): 2 Feet Assistive device: Rolling walker Gait Pattern: Trunk flexed;Lateral trunk lean to left  Posture/Postural Control Posture/Postural Control: Postural limitations Postural Limitations: head down and to left with LOB left in sitting Static Sitting Balance Static Sitting - Balance Support: Feet supported;Left upper extremity supported Static Sitting - Level of Assistance: 4: Min assist;5: Stand by assistance Static Sitting - Comment/# of Minutes: able to correct into midline with head, stays few seconds, then needs support to prevent left and anterior loss of balance Exercise  Total Joint Exercises Ankle Circles/Pumps: AROM;Both;10 reps;Seated Heel Slides: AAROM;Both;10 reps;Seated Hip ABduction/ADduction: AROM;Both;10 reps;Seated End of Session PT - End of Session Equipment Utilized During Treatment: Gait belt Activity Tolerance: Patient tolerated treatment well Patient left: in chair;with call bell in reach Nurse Communication: Mobility status for transfers General Behavior During Session: [facility] for tasks performed Cognition: Impaired, at baseline  [facility] 08/07/2011, 1:48 PM

## 2011-08-07 NOTE — Progress Notes (Signed)
Subjective: Working with PT, sitting on side of bed. Smiling. Denies pain/discomfort.   Objective: Vital signs Filed Vitals:   08/06/11 0629 08/06/11 1330 08/06/11 2104 08/07/11 0426  BP: 156/85 117/71 153/84 165/93  Pulse: 62 50 58 60  Temp: 99.9 F (37.7 C) 96.6 F (35.9 C) 98.5 F (36.9 C) 97.5 F (36.4 C)  TempSrc: Axillary Axillary Oral Axillary  Resp: 19 18 18 18   Height:      Weight: 97.4 kg (214 lb 11.7 oz)   96.3 kg (212 lb 4.9 oz)  SpO2: 100% 100% 97% 94%   Weight change: -1.1 kg (-2 lb 6.8 oz) Last BM Date: 08/03/11  Intake/Output from previous day: 03/31 0701 - 04/01 0700 In: 380 [P.O.:380] Out: 1251 [Urine:1250; Stool:1] Total I/O In: 120 [P.O.:120] Out: 400 [Urine:400]   Physical Exam: General: Alert, awake, oriented to self, in no acute distress. HEENT: No bruits, no goiter. Mucus membranes mouth moist Heart: Regular rate and rhythm, without murmurs, rubs, gallops. Lungs: Normal effort, slightly shallow otherwise breath sounds clear to auscultation bilaterally. No wheeze Abdomen: Soft, nontender, nondistended, positive bowel sounds. Extremities: No clubbing cyanosis or edema with positive pedal pulses. Neuro: Grossly intact, nonfocal.    Lab Results: Basic Metabolic Panel:  Basename 08/07/11 0450 08/06/11 0515  NA 146* 147*  K 2.9* 3.3*  CL 109 111  CO2 32 28  GLUCOSE 194* 195*  BUN 16 19  CREATININE 1.38* 1.44*  CALCIUM 8.3* 8.3*  MG -- --  PHOS -- --   Liver Function Tests: No results found for this basename: AST:2,ALT:2,ALKPHOS:2,BILITOT:2,PROT:2,ALBUMIN:2 in the last 72 hours No results found for this basename: LIPASE:2,AMYLASE:2 in the last 72 hours No results found for this basename: AMMONIA:2 in the last 72 hours CBC: No results found for this basename: WBC:2,NEUTROABS:2,HGB:2,HCT:2,MCV:2,PLT:2 in the last 72 hours Cardiac Enzymes: No results found for this basename: CKTOTAL:3,CKMB:3,CKMBINDEX:3,TROPONINI:3 in the last 72  hours BNP: No results found for this basename: PROBNP:3 in the last 72 hours D-Dimer: No results found for this basename: DDIMER:2 in the last 72 hours CBG:  Basename 08/07/11 0736 08/06/11 2101 08/06/11 1643 08/06/11 1127 08/06/11 0726 08/05/11 2134  GLUCAP 217* 327* 327* 277* 193* 304*   Hemoglobin A1C: No results found for this basename: HGBA1C in the last 72 hours Fasting Lipid Panel: No results found for this basename: CHOL,HDL,LDLCALC,TRIG,CHOLHDL,LDLDIRECT in the last 72 hours Thyroid Function Tests: No results found for this basename: TSH,T4TOTAL,FREET4,T3FREE,THYROIDAB in the last 72 hours Anemia Panel: No results found for this basename: VITAMINB12,FOLATE,FERRITIN,TIBC,IRON,RETICCTPCT in the last 72 hours Coagulation: No results found for this basename: LABPROT:2,INR:2 in the last 72 hours Urine Drug Screen: Drugs of Abuse  No results found for this basename: labopia, cocainscrnur, labbenz, amphetmu, thcu, labbarb    Alcohol Level: No results found for this basename: ETH:2 in the last 72 hours Urinalysis: No results found for this basename: COLORURINE:2,APPERANCEUR:2,LABSPEC:2,PHURINE:2,GLUCOSEU:2,HGBUR:2,BILIRUBINUR:2,KETONESUR:2,PROTEINUR:2,UROBILINOGEN:2,NITRITE:2,LEUKOCYTESUR:2 in the last 72 hours Misc. Labs:  Recent Results (from the past 240 hour(s))  CULTURE, BLOOD (ROUTINE X 2)     Status: Normal   Collection Time   07/28/11 10:40 PM      Component Value Range Status Comment   Specimen Description BLOOD LEFT ANTECUBITAL   Final    Special Requests     Final    Value: BOTTLES DRAWN AEROBIC AND ANAEROBIC AERO [facility] ANA 4 CC   Culture  Setup Time [id number]   Final    Culture     Final    Value: PROPIONIBACTERIUM SPECIES  Note: Gram Stain Report Called to,Read Back By and Verified With: [facility] REYNOLDS @ 1625 08/01/11 WICKN   Report Status 08/03/2011 FINAL   Final   CULTURE, BLOOD (ROUTINE X 2)     Status: Normal   Collection Time   07/28/11 10:45 PM       Component Value Range Status Comment   Specimen Description BLOOD LEFT HAND   Final    Special Requests     Final    Value: BOTTLES DRAWN AEROBIC AND ANAEROBIC AERO 6 CC ANA 4 CC   Culture  Setup Time [id number]   Final    Culture NO GROWTH 5 DAYS   Final    Report Status 08/04/2011 FINAL   Final   URINE CULTURE     Status: Normal   Collection Time   07/28/11 11:23 PM      Component Value Range Status Comment   Specimen Description URINE, CATHETERIZED   Final    Special Requests NONE   Final    Culture  Setup Time [id number]   Final    Colony Count NO GROWTH   Final    Culture NO GROWTH   Final    Report Status 07/30/2011 FINAL   Final   MRSA PCR SCREENING     Status: Normal   Collection Time   07/29/11  2:18 AM      Component Value Range Status Comment   MRSA by PCR NEGATIVE  NEGATIVE  Final     Studies/Results: No results found.  Medications: Scheduled Meds:   . allopurinol  100 mg Oral Daily  . amLODipine  10 mg Oral QPM  . aspirin EC  81 mg Oral Daily  . atenolol  50 mg Oral Daily  . atorvastatin  20 mg Oral QHS  . betaxolol  1 drop Both Eyes BID  . carbamazepine  100 mg Oral Daily  . carbamazepine  200 mg Oral QHS  . cloNIDine  0.2 mg Oral TID  . donepezil  10 mg Oral QHS  . enoxaparin (LOVENOX) injection  40 mg Subcutaneous Q24H  . FLUoxetine  20 mg Oral Daily  . hydrALAZINE  100 mg Oral Q8H  . insulin aspart  0-20 Units Subcutaneous TID WC  . insulin aspart  0-5 Units Subcutaneous QHS  . insulin aspart  5 Units Subcutaneous TID WC  . insulin glargine  20 Units Subcutaneous BID  . lisinopril  40 mg Oral Daily  . memantine  10 mg Oral BID  . potassium chloride  40 mEq Oral Q4H  . risperiDONE  0.25 mg Oral BID  . senna-docusate  2 tablet Oral QHS  . sodium chloride  3 mL Intravenous Q12H  . tolterodine  4 mg Oral Daily  . Travoprost (BAK Free)  1 drop Both Eyes QHS  . vitamin C  500 mg Oral BID  . DISCONTD: insulin glargine  20 Units Subcutaneous QHS    Continuous Infusions:  PRN Meds:.acetaminophen, albuterol, food thickener, hydrALAZINE, ipratropium, LORazepam  Assessment/Plan:  Principal Problem:  *Altered mental status Active Problems:  Hypernatremia  Acute kidney injury  Aspiration pneumonia  CKD (chronic kidney disease) stage 2, GFR 60-89 ml/min #1 Encephalopathy: at baseline. Likely 2/2 hypernatremia on top of baseline dementia.  #2 Hypernatremia: Suspect this has contributed to his encephalopathy. Na now 146. Encourage PO fluid intake. Now off IVF. Pt will drink po fluids that are offered to him. Effort needs to be made to offer po fluids once discharged to facility.  #  3 Acute on CKD Stage II: Suspect this is a new baseline for him. Cr has been steady in the 1.4-1.6 range despite IVF hydration. Renal US without evidence for obstruction or hydronephrosis. Cr in 5/12 was 1.16.  #4 hypokalemia: will replete and recheck.  #5. HTN: fair control on home meds. Will continue to monitor and consider med dose adjustment. #6. DM 2: fair control. HgA1c 6.3. CBG range 217-327. Will increase Lantus to 20 BiD vs 20 daily. Continue meal coverage. Monitor.  #6 Dysphagia: Diet as per ST recs.  #7 DIspo: SNF refused to accept over holiday weekend. Facility has concern over tendency for hypernatremia. Dr. Ardyth Harps discussed with facility MD. Pt. Medically stable and will be discharged to facility once arrangements confirmed.     LOS: 10 days   [facility] M 08/07/2011, 12:07 PM

## 2011-08-07 NOTE — Progress Notes (Signed)
Patient cleared for discharge. Patient accepted to [facility]. Packet copied and placed in [place]. ptar called for transportation. Patient niece informed.  Yaslin Kirtley C. Nekeya Briski MSW, LCSW [phone]

## 2011-08-07 NOTE — Discharge Instructions (Addendum)
Altered Mental Status Altered mental status most often refers to an abnormal change in your responsiveness and awareness. It can affect your speech, thought, mobility, memory, attention span, or alertness. It can range from slight confusion to complete unresponsiveness (coma). Altered mental status can be a sign of a serious underlying medical condition. Rapid evaluation and medical treatment is necessary for patients having an altered mental status. CAUSES   Low blood sugar (hypoglycemia) or diabetes.   Severe loss of body fluids (dehydration) or a body salt (electrolyte) imbalance.   A stroke or other neurologic problem, such as dementia or delirium.   A head injury or tumor.   A drug or alcohol overdose.   Exposure to toxins or poisons.   Depression, anxiety, and stress.   A low oxygen level (hypoxia).   An infection.   Blood loss.   Twitching or shaking (seizure).   Heart problems, such as heart attack or heart rhythm problems (arrhythmias).   A body temperature that is too low or too high (hypothermia or hyperthermia).  DIAGNOSIS  A diagnosis is based on your history, symptoms, physical and neurologic examinations, and diagnostic tests. Diagnostic tests may include:  Measurement of your blood pressure, pulse, breathing, and oxygen levels (vital signs).   Blood tests.   Urine tests.   X-ray exams.   A computerized magnetic scan (magnetic resonance imaging, MRI).   A computerized X-ray scan (computed tomography, CT scan).  TREATMENT  Treatment will depend on the cause. Treatment may include:  Management of an underlying medical or mental health condition.   Critical care or support in the hospital.  HOME CARE INSTRUCTIONS   Only take over-the-counter or prescription medicines for pain, discomfort, or fever as directed by your caregiver.   Manage underlying conditions as directed by your caregiver.   Eat a healthy, well-balanced diet to maintain strength.    Join a support group or prevention program to cope with the condition or trauma that caused the altered mental status. Ask your caregiver to help choose a program that works for you.   Follow up with your caregiver for further examination, therapy, or testing as directed.  SEEK MEDICAL CARE IF:   You feel unwell or have chills.   You or your family notice a change in your behavior or your alertness.   You have trouble following your caregiver's treatment plan.   You have questions or concerns.  SEEK IMMEDIATE MEDICAL CARE IF:   You have a rapid heartbeat or have chest pain.   You have difficulty breathing.   You have a fever.   You have a headache with a stiff neck.   You cough up blood.   You have blood in your urine or stool.   You have severe agitation or confusion.  MAKE SURE YOU:   Understand these instructions.   Will watch your condition.   Will get help right away if you are not doing well or get worse.  Document Released: 10/12/2009 Document Revised: 04/13/2011 Document Reviewed: 10/12/2009 [employer] Patient Information 2012 [employer]. 

## 2011-08-07 NOTE — Progress Notes (Signed)
Physical Therapy Treatment Patient Details Name: [name] MRN: [id number] DOB: 09/04/1940 Today's Date: 08/07/2011  PT Assessment/Plan  PT - Assessment/Plan Comments on Treatment Session: Assisted nurse tech for getting patient back to bed in anticipation of transport to SNF this pm.   PT Plan: Discharge plan remains appropriate PT Frequency: Min 2X/week Follow Up Recommendations: Skilled nursing facility Equipment Recommended: Defer to next venue PT Goals  Acute Rehab PT Goals Pt will go Sit to Stand: with mod assist PT Goal: Sit to Stand - Progress: Progressing toward goal Pt will go Stand to Sit: with supervision PT Goal: Stand to Sit - Progress: Progressing toward goal Pt will Transfer Bed to Chair/Chair to Bed: with min assist PT Transfer Goal: Bed to Chair/Chair to Bed - Progress: Progressing toward goal  PT Treatment Precautions/Restrictions  Precautions Precautions: Fall Precaution Comments: D1; nectar liquids Restrictions Weight Bearing Restrictions: No Mobility (including Balance) Bed Mobility Sit to Supine: 1: +2 Total assist;HOB flat Sit to Supine - Details (indicate cue type and reason): pt=30% assist for feet in bed and to lie shoulders back Transfers Sit to Stand: 1: +2 Total assist;From chair/3-in-1 Sit to Stand Details (indicate cue type and reason): pt=40% Stand to Sit: To bed;1: +2 Total assist Stand to Sit Details: pt=50% Ambulation/Gait Ambulation/Gait Assistance: 1: +2 Total assist Ambulation/Gait Assistance Details (indicate cue type and reason): pt=40%; cues and assist for upright posture and to step to bed from recliner; heavy UE reliance on RW with decreased foot clearance with steps Ambulation Distance (Feet): 2 Feet Assistive device: Rolling walker Gait Pattern: Decreased step length - right;Trunk flexed  Posture/Postural Control Posture/Postural Control: Postural limitations Postural Limitations: trunk flexed and head down Exercise    End of Session PT - End of Session Equipment Utilized During Treatment: Gait belt Activity Tolerance: Patient limited by fatigue Patient left: in bed;with call bell in reach Nurse Communication: Mobility status for transfers General Behavior During Session: [facility] for tasks performed Cognition: Impaired, at baseline  [facility] 08/07/2011, 4:04 PM

## 2012-07-08 ENCOUNTER — Emergency Department (HOSPITAL_COMMUNITY): Payer: Medicare Other

## 2012-07-08 ENCOUNTER — Encounter (HOSPITAL_COMMUNITY): Payer: Self-pay | Admitting: *Deleted

## 2012-07-08 ENCOUNTER — Inpatient Hospital Stay (HOSPITAL_COMMUNITY)
Admission: EM | Admit: 2012-07-08 | Discharge: 2012-07-11 | DRG: 189 | Disposition: A | Discharge status: Skilled Nursing Facility | Payer: Medicare Other | Attending: Internal Medicine | Admitting: Internal Medicine

## 2012-07-08 DIAGNOSIS — Z87891 Personal history of nicotine dependence: Secondary | ICD-10-CM

## 2012-07-08 DIAGNOSIS — D72819 Decreased white blood cell count, unspecified: Secondary | ICD-10-CM | POA: Diagnosis present

## 2012-07-08 DIAGNOSIS — Z8673 Personal history of transient ischemic attack (TIA), and cerebral infarction without residual deficits: Secondary | ICD-10-CM

## 2012-07-08 DIAGNOSIS — R0602 Shortness of breath: Secondary | ICD-10-CM

## 2012-07-08 DIAGNOSIS — Z85038 Personal history of other malignant neoplasm of large intestine: Secondary | ICD-10-CM

## 2012-07-08 DIAGNOSIS — R4182 Altered mental status, unspecified: Secondary | ICD-10-CM

## 2012-07-08 DIAGNOSIS — Z79899 Other long term (current) drug therapy: Secondary | ICD-10-CM

## 2012-07-08 DIAGNOSIS — F0390 Unspecified dementia without behavioral disturbance: Secondary | ICD-10-CM

## 2012-07-08 DIAGNOSIS — N179 Acute kidney failure, unspecified: Secondary | ICD-10-CM

## 2012-07-08 DIAGNOSIS — J9600 Acute respiratory failure, unspecified whether with hypoxia or hypercapnia: Principal | ICD-10-CM | POA: Diagnosis present

## 2012-07-08 DIAGNOSIS — D638 Anemia in other chronic diseases classified elsewhere: Secondary | ICD-10-CM | POA: Diagnosis present

## 2012-07-08 DIAGNOSIS — N184 Chronic kidney disease, stage 4 (severe): Secondary | ICD-10-CM

## 2012-07-08 DIAGNOSIS — F70 Mild intellectual disabilities: Secondary | ICD-10-CM | POA: Diagnosis present

## 2012-07-08 DIAGNOSIS — N182 Chronic kidney disease, stage 2 (mild): Secondary | ICD-10-CM

## 2012-07-08 DIAGNOSIS — J441 Chronic obstructive pulmonary disease with (acute) exacerbation: Secondary | ICD-10-CM

## 2012-07-08 DIAGNOSIS — I119 Hypertensive heart disease without heart failure: Secondary | ICD-10-CM | POA: Diagnosis present

## 2012-07-08 DIAGNOSIS — E119 Type 2 diabetes mellitus without complications: Secondary | ICD-10-CM

## 2012-07-08 DIAGNOSIS — E870 Hyperosmolality and hypernatremia: Secondary | ICD-10-CM

## 2012-07-08 DIAGNOSIS — I10 Essential (primary) hypertension: Secondary | ICD-10-CM | POA: Diagnosis present

## 2012-07-08 DIAGNOSIS — J9601 Acute respiratory failure with hypoxia: Secondary | ICD-10-CM

## 2012-07-08 DIAGNOSIS — M109 Gout, unspecified: Secondary | ICD-10-CM

## 2012-07-08 DIAGNOSIS — I1310 Hypertensive heart and chronic kidney disease without heart failure, with stage 1 through stage 4 chronic kidney disease, or unspecified chronic kidney disease: Secondary | ICD-10-CM | POA: Diagnosis present

## 2012-07-08 DIAGNOSIS — Z794 Long term (current) use of insulin: Secondary | ICD-10-CM

## 2012-07-08 DIAGNOSIS — J690 Pneumonitis due to inhalation of food and vomit: Secondary | ICD-10-CM

## 2012-07-08 DIAGNOSIS — Z9049 Acquired absence of other specified parts of digestive tract: Secondary | ICD-10-CM

## 2012-07-08 DIAGNOSIS — E785 Hyperlipidemia, unspecified: Secondary | ICD-10-CM | POA: Diagnosis present

## 2012-07-08 LAB — BASIC METABOLIC PANEL
CO2: 25 mEq/L (ref 19–32)
Calcium: 8.7 mg/dL (ref 8.4–10.5)
Potassium: 3.4 mEq/L — ABNORMAL LOW (ref 3.5–5.1)
Sodium: 142 mEq/L (ref 135–145)

## 2012-07-08 LAB — POCT I-STAT TROPONIN I

## 2012-07-08 LAB — BLOOD GAS, ARTERIAL
Acid-Base Excess: 0.4 mmol/L (ref 0.0–2.0)
Drawn by: 33176
O2 Content: 2 L/min
O2 Saturation: 98.2 %
pCO2 arterial: 42.7 mmHg (ref 35.0–45.0)

## 2012-07-08 LAB — CBC
Hemoglobin: 13.7 g/dL (ref 13.0–17.0)
MCV: 94.4 fL (ref 78.0–100.0)
Platelets: 279 10*3/uL (ref 150–400)
RBC: 4.27 MIL/uL (ref 4.22–5.81)
WBC: 8.9 10*3/uL (ref 4.0–10.5)

## 2012-07-08 LAB — PRO B NATRIURETIC PEPTIDE: Pro B Natriuretic peptide (BNP): 444.5 pg/mL — ABNORMAL HIGH (ref 0–125)

## 2012-07-08 MED ORDER — FESOTERODINE FUMARATE ER 4 MG PO TB24
4.0000 mg | ORAL_TABLET | Freq: Every day | ORAL | Status: DC
  Administered 2012-07-08 – 2012-07-10 (×3): 4 mg via ORAL
  Filled 2012-07-08 (×6): qty 1

## 2012-07-08 MED ORDER — HYDRALAZINE HCL 50 MG PO TABS
100.0000 mg | ORAL_TABLET | Freq: Three times a day (TID) | ORAL | Status: DC
  Administered 2012-07-08 – 2012-07-11 (×9): 100 mg via ORAL
  Filled 2012-07-08 (×13): qty 2

## 2012-07-08 MED ORDER — FLUOXETINE HCL 20 MG PO CAPS
20.0000 mg | ORAL_CAPSULE | Freq: Every day | ORAL | Status: DC
  Administered 2012-07-09 – 2012-07-11 (×3): 20 mg via ORAL
  Filled 2012-07-08 (×3): qty 1

## 2012-07-08 MED ORDER — ALLOPURINOL 100 MG PO TABS
100.0000 mg | ORAL_TABLET | Freq: Every day | ORAL | Status: DC
  Administered 2012-07-09 – 2012-07-11 (×3): 100 mg via ORAL
  Filled 2012-07-08 (×3): qty 1

## 2012-07-08 MED ORDER — ASPIRIN EC 81 MG PO TBEC
81.0000 mg | DELAYED_RELEASE_TABLET | Freq: Every day | ORAL | Status: DC
  Administered 2012-07-08 – 2012-07-11 (×4): 81 mg via ORAL
  Filled 2012-07-08 (×4): qty 1

## 2012-07-08 MED ORDER — SODIUM CHLORIDE 0.9 % IJ SOLN
3.0000 mL | Freq: Two times a day (BID) | INTRAMUSCULAR | Status: DC
  Administered 2012-07-09 (×2): 3 mL via INTRAVENOUS

## 2012-07-08 MED ORDER — ACETAMINOPHEN 325 MG PO TABS: 650.0000 mg | ORAL_TABLET | Freq: Four times a day (QID) | ORAL | Status: DC | PRN

## 2012-07-08 MED ORDER — FUROSEMIDE 10 MG/ML IJ SOLN
20.0000 mg | Freq: Once | INTRAMUSCULAR | Status: AC
  Administered 2012-07-08: via INTRAVENOUS

## 2012-07-08 MED ORDER — MEMANTINE HCL 10 MG PO TABS
10.0000 mg | ORAL_TABLET | Freq: Two times a day (BID) | ORAL | Status: DC
  Administered 2012-07-08 – 2012-07-11 (×6): 10 mg via ORAL
  Filled 2012-07-08 (×7): qty 1

## 2012-07-08 MED ORDER — VITAMIN C 500 MG PO TABS
500.0000 mg | ORAL_TABLET | Freq: Every day | ORAL | Status: DC
  Administered 2012-07-09 – 2012-07-11 (×3): 500 mg via ORAL
  Filled 2012-07-08 (×3): qty 1

## 2012-07-08 MED ORDER — ATENOLOL 50 MG PO TABS
50.0000 mg | ORAL_TABLET | Freq: Every day | ORAL | Status: DC
  Administered 2012-07-09 – 2012-07-11 (×3): 50 mg via ORAL
  Filled 2012-07-08 (×6): qty 1

## 2012-07-08 MED ORDER — CARBAMAZEPINE 100 MG PO CHEW
200.0000 mg | CHEWABLE_TABLET | Freq: Every day | ORAL | Status: DC
  Administered 2012-07-08 – 2012-07-10 (×4): 200 mg via ORAL
  Filled 2012-07-08 (×4): qty 2

## 2012-07-08 MED ORDER — BUDESONIDE 0.25 MG/2ML IN SUSP
0.2500 mg | Freq: Two times a day (BID) | RESPIRATORY_TRACT | Status: DC
  Administered 2012-07-08 – 2012-07-11 (×5): 0.25 mg via RESPIRATORY_TRACT
  Filled 2012-07-08 (×8): qty 2

## 2012-07-08 MED ORDER — BETAXOLOL HCL 0.25 % OP SUSP
1.0000 [drp] | Freq: Two times a day (BID) | OPHTHALMIC | Status: DC
  Administered 2012-07-08 – 2012-07-11 (×4): 1 [drp] via OPHTHALMIC
  Filled 2012-07-08 (×3): qty 10

## 2012-07-08 MED ORDER — ALBUTEROL SULFATE (5 MG/ML) 0.5% IN NEBU
2.5000 mg | INHALATION_SOLUTION | Freq: Four times a day (QID) | RESPIRATORY_TRACT | Status: DC
  Administered 2012-07-09 – 2012-07-11 (×10): 2.5 mg via RESPIRATORY_TRACT
  Filled 2012-07-08 (×10): qty 0.5

## 2012-07-08 MED ORDER — TRAVOPROST 0.004 % OP SOLN: 1.0000 [drp] | Freq: Every day | OPHTHALMIC | Status: DC

## 2012-07-08 MED ORDER — ATORVASTATIN CALCIUM 20 MG PO TABS
20.0000 mg | ORAL_TABLET | Freq: Every day | ORAL | Status: DC
  Administered 2012-07-09 – 2012-07-10 (×2): 20 mg via ORAL
  Filled 2012-07-08 (×4): qty 1

## 2012-07-08 MED ORDER — ENOXAPARIN SODIUM 40 MG/0.4ML ~~LOC~~ SOLN
40.0000 mg | SUBCUTANEOUS | Status: DC
  Administered 2012-07-09 – 2012-07-10 (×3): 40 mg via SUBCUTANEOUS
  Filled 2012-07-08 (×5): qty 0.4

## 2012-07-08 MED ORDER — CARBAMAZEPINE 100 MG PO CHEW
100.0000 mg | CHEWABLE_TABLET | Freq: Every day | ORAL | Status: DC
  Administered 2012-07-09 – 2012-07-11 (×3): 100 mg via ORAL
  Filled 2012-07-08 (×3): qty 1

## 2012-07-08 MED ORDER — ONDANSETRON HCL 4 MG/2ML IJ SOLN: 4.0000 mg | Freq: Four times a day (QID) | INTRAMUSCULAR | Status: DC | PRN

## 2012-07-08 MED ORDER — ALBUTEROL SULFATE (5 MG/ML) 0.5% IN NEBU: 2.5000 mg | INHALATION_SOLUTION | RESPIRATORY_TRACT | Status: DC | PRN

## 2012-07-08 MED ORDER — CARBAMAZEPINE 100 MG PO CHEW: 100.0000 mg | CHEWABLE_TABLET | Freq: Two times a day (BID) | ORAL | Status: DC

## 2012-07-08 MED ORDER — INSULIN GLARGINE 100 UNIT/ML ~~LOC~~ SOLN
20.0000 [IU] | Freq: Two times a day (BID) | SUBCUTANEOUS | Status: DC
  Administered 2012-07-08 – 2012-07-09 (×2): 20 [IU] via SUBCUTANEOUS

## 2012-07-08 MED ORDER — IPRATROPIUM BROMIDE 0.02 % IN SOLN
0.5000 mg | Freq: Once | RESPIRATORY_TRACT | Status: AC
  Administered 2012-07-08: 0.5 mg via RESPIRATORY_TRACT
  Filled 2012-07-08: qty 2.5

## 2012-07-08 MED ORDER — ALBUTEROL (5 MG/ML) CONTINUOUS INHALATION SOLN
INHALATION_SOLUTION | RESPIRATORY_TRACT | Status: AC
  Filled 2012-07-08: qty 20

## 2012-07-08 MED ORDER — SODIUM CHLORIDE 0.9 % IJ SOLN
3.0000 mL | Freq: Two times a day (BID) | INTRAMUSCULAR | Status: DC
  Administered 2012-07-08 – 2012-07-11 (×4): 3 mL via INTRAVENOUS

## 2012-07-08 MED ORDER — DIVALPROEX SODIUM 500 MG PO DR TAB
500.0000 mg | DELAYED_RELEASE_TABLET | Freq: Three times a day (TID) | ORAL | Status: DC
  Administered 2012-07-08 – 2012-07-11 (×9): 500 mg via ORAL
  Filled 2012-07-08 (×13): qty 1

## 2012-07-08 MED ORDER — ALBUTEROL (5 MG/ML) CONTINUOUS INHALATION SOLN
10.0000 mg/h | INHALATION_SOLUTION | RESPIRATORY_TRACT | Status: DC
  Administered 2012-07-08: 10 mg/h via RESPIRATORY_TRACT

## 2012-07-08 MED ORDER — ASPIRIN 81 MG PO TABS: 81.0000 mg | ORAL_TABLET | Freq: Every day | ORAL | Status: DC

## 2012-07-08 MED ORDER — IPRATROPIUM BROMIDE 0.02 % IN SOLN
0.5000 mg | Freq: Four times a day (QID) | RESPIRATORY_TRACT | Status: DC
  Administered 2012-07-08 – 2012-07-11 (×11): 0.5 mg via RESPIRATORY_TRACT
  Filled 2012-07-08 (×11): qty 2.5

## 2012-07-08 MED ORDER — AMLODIPINE BESYLATE 10 MG PO TABS
10.0000 mg | ORAL_TABLET | Freq: Every day | ORAL | Status: DC
  Administered 2012-07-08 – 2012-07-10 (×3): 10 mg via ORAL
  Filled 2012-07-08 (×4): qty 1

## 2012-07-08 MED ORDER — INSULIN ASPART 100 UNIT/ML ~~LOC~~ SOLN
0.0000 [IU] | Freq: Three times a day (TID) | SUBCUTANEOUS | Status: DC
  Administered 2012-07-09: 5 [IU] via SUBCUTANEOUS
  Administered 2012-07-09: 2 [IU] via SUBCUTANEOUS
  Administered 2012-07-10: 5 [IU] via SUBCUTANEOUS
  Administered 2012-07-10: 3 [IU] via SUBCUTANEOUS
  Administered 2012-07-10 – 2012-07-11 (×2): 2 [IU] via SUBCUTANEOUS

## 2012-07-08 MED ORDER — LEVOFLOXACIN IN D5W 750 MG/150ML IV SOLN
750.0000 mg | INTRAVENOUS | Status: DC
  Administered 2012-07-08 – 2012-07-09 (×2): 750 mg via INTRAVENOUS
  Filled 2012-07-08 (×3): qty 150

## 2012-07-08 MED ORDER — METHYLPREDNISOLONE SODIUM SUCC 40 MG IJ SOLR
40.0000 mg | Freq: Every day | INTRAMUSCULAR | Status: DC
  Administered 2012-07-08 – 2012-07-09 (×2): 40 mg via INTRAVENOUS
  Filled 2012-07-08 (×2): qty 1

## 2012-07-08 MED ORDER — METHYLPREDNISOLONE SODIUM SUCC 125 MG IJ SOLR: 125.0000 mg | Freq: Once | INTRAMUSCULAR | Status: DC

## 2012-07-08 MED ORDER — ONDANSETRON HCL 4 MG PO TABS: 4.0000 mg | ORAL_TABLET | Freq: Four times a day (QID) | ORAL | Status: DC | PRN

## 2012-07-08 MED ORDER — ALBUTEROL SULFATE (5 MG/ML) 0.5% IN NEBU
INHALATION_SOLUTION | RESPIRATORY_TRACT | Status: AC
  Filled 2012-07-08: qty 0.5

## 2012-07-08 MED ORDER — FUROSEMIDE 10 MG/ML IJ SOLN
INTRAMUSCULAR | Status: AC
  Filled 2012-07-08: qty 4

## 2012-07-08 MED ORDER — ALBUTEROL SULFATE (5 MG/ML) 0.5% IN NEBU
10.0000 mg | INHALATION_SOLUTION | Freq: Once | RESPIRATORY_TRACT | Status: AC
  Administered 2012-07-08: 10 mg via RESPIRATORY_TRACT
  Filled 2012-07-08: qty 2

## 2012-07-08 MED ORDER — TRAVOPROST (BAK FREE) 0.004 % OP SOLN
1.0000 [drp] | Freq: Every day | OPHTHALMIC | Status: DC
  Administered 2012-07-08 – 2012-07-10 (×3): 1 [drp] via OPHTHALMIC
  Filled 2012-07-08: qty 2.5

## 2012-07-08 MED ORDER — DONEPEZIL HCL 10 MG PO TABS
10.0000 mg | ORAL_TABLET | Freq: Every day | ORAL | Status: DC
  Administered 2012-07-08 – 2012-07-10 (×3): 10 mg via ORAL
  Filled 2012-07-08 (×4): qty 1

## 2012-07-08 MED ORDER — CLONIDINE HCL 0.3 MG PO TABS
0.3000 mg | ORAL_TABLET | Freq: Three times a day (TID) | ORAL | Status: DC
  Administered 2012-07-08 – 2012-07-11 (×9): 0.3 mg via ORAL
  Filled 2012-07-08 (×10): qty 1

## 2012-07-08 MED ORDER — ACETAMINOPHEN 650 MG RE SUPP: 650.0000 mg | Freq: Four times a day (QID) | RECTAL | Status: DC | PRN

## 2012-07-08 MED ORDER — ALBUTEROL SULFATE (5 MG/ML) 0.5% IN NEBU: 2.5000 mg | INHALATION_SOLUTION | Freq: Four times a day (QID) | RESPIRATORY_TRACT | Status: DC

## 2012-07-08 MED ORDER — ALBUTEROL SULFATE (5 MG/ML) 0.5% IN NEBU
2.5000 mg | INHALATION_SOLUTION | RESPIRATORY_TRACT | Status: DC | PRN
  Administered 2012-07-08: 2.5 mg via RESPIRATORY_TRACT

## 2012-07-08 NOTE — Progress Notes (Signed)
ANTIBIOTIC CONSULT NOTE - INITIAL  Pharmacy Consult for levaquin Indication: COPD exacerbation  No Known Allergies  Patient Measurements:    Vital Signs: Temp: 97.8 F (36.6 C) (03/03 1534) Temp src: Axillary (03/03 1534) BP: 168/92 mmHg (03/03 1934) Pulse Rate: 101 (03/03 1934) Intake/Output from previous day:   Intake/Output from this shift:    Labs:  Recent Labs  07/08/12 1605  WBC 8.9  HGB 13.7  PLT 279  CREATININE 1.31   The CrCl is unknown because both a height and weight (above a minimum accepted value) are required for this calculation. No results found for this basename: VANCOTROUGH, VANCOPEAK, VANCORANDOM, GENTTROUGH, GENTPEAK, GENTRANDOM, TOBRATROUGH, TOBRAPEAK, TOBRARND, AMIKACINPEAK, AMIKACINTROU, AMIKACIN,  in the last 72 hours   Microbiology: No results found for this or any previous visit (from the past 720 hour(s)).  Medical History: Past Medical History  Diagnosis Date  . Colon cancer   . Dementia   . CHF (congestive heart failure)   . Hypertension   . Hyperlipemia   . Diabetes mellitus   . History of CVA (cerebrovascular accident)   . History of colectomy   . Mild mental retardation     Medications:  Prescriptions prior to admission  Medication Sig Dispense Refill  . acetaminophen (TYLENOL) 325 MG tablet Take 650 mg by mouth every 6 (six) hours as needed for pain. For pain      . allopurinol (ZYLOPRIM) 100 MG tablet Take 100 mg by mouth daily.       Marland Kitchen amLODipine (NORVASC) 10 MG tablet Take 10 mg by mouth every evening. Hold for SBP<110      . aspirin 81 MG tablet Take 81 mg by mouth daily.        Marland Kitchen atenolol (TENORMIN) 50 MG tablet Take 50 mg by mouth daily.      . AZO-CRANBERRY PO Take 475 mg by mouth 2 (two) times daily.        . carbamazepine (TEGRETOL) 100 MG chewable tablet Chew 100-200 mg by mouth 2 (two) times daily. Takes 100 mg in the am and 200 mg every evening      . cloNIDine (CATAPRES) 0.3 MG tablet Take 0.3 mg by mouth 3  (three) times daily.      . divalproex (DEPAKOTE) 500 MG DR tablet Take 500 mg by mouth 3 (three) times daily.      Marland Kitchen donepezil (ARICEPT) 10 MG tablet Take 10 mg by mouth at bedtime.       Marland Kitchen FLUoxetine (PROZAC) 20 MG tablet Take 20 mg by mouth daily.        . hydrALAZINE (APRESOLINE) 100 MG tablet Take 1 tablet (100 mg total) by mouth every 8 (eight) hours.      . insulin aspart (NOVOLOG) 100 UNIT/ML injection Inject into the skin 3 (three) times daily before meals. Sliding scale 70-120 no insulin 121-150=3 units 151-200=4 units 201-250=7 units 251-300=11 units 351-400=20 units      . insulin glargine (LANTUS) 100 UNIT/ML injection Inject 33 Units into the skin 2 (two) times daily.      . memantine (NAMENDA) 10 MG tablet Take 10 mg by mouth 2 (two) times daily.        Marland Kitchen tolterodine (DETROL LA) 4 MG 24 hr capsule Take 4 mg by mouth daily.      . travoprost, benzalkonium, (TRAVATAN) 0.004 % ophthalmic solution Place 1 drop into both eyes at bedtime.        . vitamin C (ASCORBIC ACID) 500 MG  tablet Take 500 mg by mouth daily.       Marland Kitchen atorvastatin (LIPITOR) 20 MG tablet Take 1 tablet (20 mg total) by mouth at bedtime.      . betaxolol (BETOPTIC-S) 0.25 % ophthalmic suspension Place 1 drop into both eyes 2 (two) times daily.         Assessment: 30 yom presents with possible COPD exacerbation. Pt is currently afebrile and WBC is WNL. Normalized CrCl >63ml/hr.   Plan:  1. Levaquin 750mg  IV Q24H 2. F/u renal fxn, C&S, clinical status and trough at [facility], Drake Leach 07/08/2012,9:29 PM

## 2012-07-08 NOTE — ED Notes (Signed)
Received bedside report from [place], [place].  Patient currently sitting up in bed; no respiratory or acute distress noted.  Patient updated on plan of care; informed patient that he is currently pending for a bed assignment.  Patient denies any needs at this time; denies pain.  Will continue to monitor.

## 2012-07-08 NOTE — ED Provider Notes (Signed)
History    CSN: [id number] Arrival date & time 07/08/12  1524 First MD Initiated Contact with Patient 07/08/12 1529     Chief Complaint  Patient presents with  . Shortness of Breath  . Respiratory Distress   Patient is a 72 y.o. male presenting with shortness of breath. The history is provided by the patient.  Shortness of Breath Severity:  Severe Onset quality:  Gradual Duration: symptoms started today. Timing:  Constant Progression:  Worsening (Patient started getting very short of breath today. Approximately one hour prior to arrival the staff at the facility where he resides noted his respiratory difficulty had become more severe.) Chronicity:  Recurrent Context: URI   Context comment:  The patient has had upper respiratory symptoms the last day or 2. Relieved by:  Oxygen (Patient was treated with albuterol Atrovent and Solu-Medrol by EMS. He had noted some improvement with that treatment) Associated symptoms: cough   Associated symptoms: no abdominal pain, no chest pain, no diaphoresis, no fever, no sore throat and no syncope     Past Medical History  Diagnosis Date  . Colon cancer   . Dementia   . CHF (congestive heart failure)   . Hypertension   . Hyperlipemia   . Diabetes mellitus   . History of CVA (cerebrovascular accident)   . History of colectomy   . Mild mental retardation     Past Surgical History  Procedure Laterality Date  . Colectomy  08/2008    Family History  Problem Relation Age of Onset  . Diabetes Mother   . Diabetes Mother   . Diabetes Sister   . Diabetes Mother   . Diabetes Father   . Hypertension Sister   . Hypertension Mother   . Hypertension Father   . Hypertension Brother     History  Substance Use Topics  . Smoking status: Former Smoker -- 2.00 packs/day    Quit date: 07/29/1991  . Smokeless tobacco: Never Used  . Alcohol Use: No      Review of Systems  Constitutional: Negative for fever and diaphoresis.  HENT: Negative for  sore throat.   Respiratory: Positive for cough and shortness of breath.   Cardiovascular: Negative for chest pain and syncope.  Gastrointestinal: Negative for abdominal pain.  All other systems reviewed and are negative.    Allergies  Review of patient's allergies indicates no known allergies.  Home Medications   Current Outpatient Rx  Name  Route  Sig  Dispense  Refill  . acetaminophen (TYLENOL) 325 MG tablet   Oral   Take 650 mg by mouth every 6 (six) hours as needed for pain. For pain         . allopurinol (ZYLOPRIM) 100 MG tablet   Oral   Take 100 mg by mouth daily.          Marland Kitchen amLODipine (NORVASC) 10 MG tablet   Oral   Take 10 mg by mouth every evening. Hold for SBP<110         . aspirin 81 MG tablet   Oral   Take 81 mg by mouth daily.           Marland Kitchen atenolol (TENORMIN) 50 MG tablet   Oral   Take 50 mg by mouth daily.         . AZO-CRANBERRY PO   Oral   Take 475 mg by mouth 2 (two) times daily.           . carbamazepine (TEGRETOL) 100 MG  chewable tablet   Oral   Chew 100-200 mg by mouth 2 (two) times daily. Takes 100 mg in the am and 200 mg every evening         . cloNIDine (CATAPRES) 0.3 MG tablet   Oral   Take 0.3 mg by mouth 3 (three) times daily.         . divalproex (DEPAKOTE) 500 MG DR tablet   Oral   Take 500 mg by mouth 3 (three) times daily.         Marland Kitchen donepezil (ARICEPT) 10 MG tablet   Oral   Take 10 mg by mouth at bedtime.          Marland Kitchen FLUoxetine (PROZAC) 20 MG tablet   Oral   Take 20 mg by mouth daily.           . hydrALAZINE (APRESOLINE) 100 MG tablet   Oral   Take 1 tablet (100 mg total) by mouth every 8 (eight) hours.         . insulin aspart (NOVOLOG) 100 UNIT/ML injection   Subcutaneous   Inject into the skin 3 (three) times daily before meals. Sliding scale 70-120 no insulin 121-150=3 units 151-200=4 units 201-250=7 units 251-300=11 units 351-400=20 units         . insulin glargine (LANTUS) 100 UNIT/ML  injection   Subcutaneous   Inject 33 Units into the skin 2 (two) times daily.         . memantine (NAMENDA) 10 MG tablet   Oral   Take 10 mg by mouth 2 (two) times daily.           Marland Kitchen tolterodine (DETROL LA) 4 MG 24 hr capsule   Oral   Take 4 mg by mouth daily.         . travoprost, benzalkonium, (TRAVATAN) 0.004 % ophthalmic solution   Both Eyes   Place 1 drop into both eyes at bedtime.           . vitamin C (ASCORBIC ACID) 500 MG tablet   Oral   Take 500 mg by mouth daily.          Marland Kitchen atorvastatin (LIPITOR) 20 MG tablet   Oral   Take 1 tablet (20 mg total) by mouth at bedtime.         . betaxolol (BETOPTIC-S) 0.25 % ophthalmic suspension   Both Eyes   Place 1 drop into both eyes 2 (two) times daily.             BP 151/108  Pulse 108  Temp(Src) 97.8 F (36.6 C) (Axillary)  Resp 26  SpO2 96%  Physical Exam  Nursing note and vitals reviewed. HENT:  Head: Normocephalic and atraumatic.  Right Ear: External ear normal.  Left Ear: External ear normal.  Eyes: Conjunctivae are normal. Right eye exhibits no discharge. Left eye exhibits no discharge. No scleral icterus.  Neck: Neck supple. No tracheal deviation present.  Cardiovascular: Regular rhythm and intact distal pulses.  Tachycardia present.   Pulmonary/Chest: Accessory muscle usage present. No stridor. Tachypnea noted. He has decreased breath sounds. He has wheezes. He has no rhonchi. He has no rales.  Abdominal: Soft. Bowel sounds are normal. He exhibits no distension. There is no tenderness. There is no rebound and no guarding.  Musculoskeletal: He exhibits no edema and no tenderness.  Neurological: He is alert. He has normal strength. No sensory deficit. Cranial nerve deficit:  no gross defecits noted. He exhibits normal muscle tone.  He displays no seizure activity. Coordination normal.  Skin: Skin is warm and dry. No rash noted. He is not diaphoretic.  Psychiatric: He has a normal mood and affect.     ED Course  Procedures (including critical care time)  Rate: 98  Rhythm: normal sinus rhythm  QRS Axis: normal  Intervals: normal  ST/T Wave abnormalities: Nonspecific T wave changes laterally  Conduction Disutrbances:none  Narrative Interpretation: Poor R-wave progression  Old EKG Reviewed: No prior EKG for comparison  Labs Reviewed  BASIC METABOLIC PANEL - Abnormal; Notable for the following:    Potassium 3.4 (*)    Glucose, Bld 178 (*)    GFR calc non Af Amer 53 (*)    GFR calc Af Amer 61 (*)    All other components within normal limits  PRO B NATRIURETIC PEPTIDE - Abnormal; Notable for the following:    Pro B Natriuretic peptide (BNP) 444.5 (*)    All other components within normal limits  CBC  POCT I-STAT TROPONIN I   Dg Chest Port 1 View  07/08/2012  *RADIOLOGY REPORT*  Clinical Data: Shortness of breath, respiratory distress, CHF, hypertension, former smoker  PORTABLE CHEST - 1 VIEW  Comparison: Portable exam 1600 hours compared to 07/28/2011  Findings: Enlargement of cardiac silhouette with pulmonary vascular congestion. Tortuous aorta. No acute failure or consolidation. Minimal bibasilar atelectasis. No acute osseous findings, pleural effusion or pneumothorax.  IMPRESSION: Enlargement cardiac silhouette with minimal pulmonary vascular congestion. Minimal bibasilar atelectasis.   Original Report Authenticated By: Ulyses Southward, M.D.      1. COPD with acute exacerbation       MDM  Pt has received albuterol atrovent treatments and streroids.  He has had some improvement but he continues to wheeze significantly.  Does not appear to be tiring.  Doubt CHF exacerbation, pna.  Will admit for further treatment.           Celene Kras, MD 07/08/12 [phone]

## 2012-07-08 NOTE — ED Notes (Signed)
Report given to [place], Charity fundraiser.  No further questions/concerns from RN.  Informed RN that she can call back with any questions/concerns once patient arrives to floor.  Preparing patient for transport.

## 2012-07-08 NOTE — H&P (Signed)
[facility] History and Physical  WAYLAN BUSTA ZOX:[id number] DOB: [birth year] DOA: 07/08/2012  Referring physician: ED physician. PCP: No primary provider on file.  Specialists: None.  Chief Complaint: Shortness of breath.  HPI: [name] is a 72 y.o. male with known history of dementia, chronic kidney disease, hypertension, hyperlipidemia, diabetes mellitus type 2, gout and a history of dysphagia was brought to the ER from the nursing home because of worsening shortness of breath. As per  patient's niece with whom I discussed over the phone, patient was having shortness of breath last 2 days which has gradually worsened and patient was found to be wheezing and having a nonproductive cough but was afebrile. In the ER chest x-ray only show enlargement of the cardiac shadow with no infiltrates and patient was afebrile but wheezing and was mild tachypneic. Patient was started on nebulizer and steroids and has been admitted for shortness of breath most likely from COPD exacerbation. As per patient's niece patient's diet was recently changed to liquid and they were suspecting possible aspiration. Patient is demented and does not provide any history.  Review of Systems: As per the history of presenting illness nothing else significant.  Past Medical History  Diagnosis Date  . Colon cancer   . Dementia   . CHF (congestive heart failure)   . Hypertension   . Hyperlipemia   . Diabetes mellitus   . History of CVA (cerebrovascular accident)   . History of colectomy   . Mild mental retardation    Past Surgical History  Procedure Laterality Date  . Colectomy  08/2008   Social History:  reports that he quit smoking about 20 years ago. He has never used smokeless tobacco. He reports that he does not drink alcohol or use illicit drugs. Lives at nursing home. where does patient live--home, ALF, SNF? and with whom if at home? Can not do ADLs. Can patient participate in ADLs?  No  Known Allergies  Family History  Problem Relation Age of Onset  . Diabetes Mother   . Diabetes Mother   . Diabetes Sister   . Diabetes Mother   . Diabetes Father   . Hypertension Sister   . Hypertension Mother   . Hypertension Father   . Hypertension Brother       Prior to Admission medications   Medication Sig Start Date End Date Taking? Authorizing Provider  acetaminophen (TYLENOL) 325 MG tablet Take 650 mg by mouth every 6 (six) hours as needed for pain. For pain   Yes Historical Provider, MD  allopurinol (ZYLOPRIM) 100 MG tablet Take 100 mg by mouth daily.    Yes Historical Provider, MD  amLODipine (NORVASC) 10 MG tablet Take 10 mg by mouth every evening. Hold for SBP<110   Yes Historical Provider, MD  aspirin 81 MG tablet Take 81 mg by mouth daily.     Yes Historical Provider, MD  atenolol (TENORMIN) 50 MG tablet Take 50 mg by mouth daily.   Yes Historical Provider, MD  AZO-CRANBERRY PO Take 475 mg by mouth 2 (two) times daily.     Yes Historical Provider, MD  carbamazepine (TEGRETOL) 100 MG chewable tablet Chew 100-200 mg by mouth 2 (two) times daily. Takes 100 mg in the am and 200 mg every evening   Yes Historical Provider, MD  cloNIDine (CATAPRES) 0.3 MG tablet Take 0.3 mg by mouth 3 (three) times daily.   Yes Historical Provider, MD  divalproex (DEPAKOTE) 500 MG DR tablet Take 500 mg by  mouth 3 (three) times daily.   Yes Historical Provider, MD  donepezil (ARICEPT) 10 MG tablet Take 10 mg by mouth at bedtime.    Yes Historical Provider, MD  FLUoxetine (PROZAC) 20 MG tablet Take 20 mg by mouth daily.     Yes Historical Provider, MD  hydrALAZINE (APRESOLINE) 100 MG tablet Take 1 tablet (100 mg total) by mouth every 8 (eight) hours. 08/07/11 08/06/12 Yes Lesle Chris Black, NP  insulin aspart (NOVOLOG) 100 UNIT/ML injection Inject into the skin 3 (three) times daily before meals. Sliding scale 70-120 no insulin 121-150=3 units 151-200=4 units 201-250=7 units 251-300=11 units 351-400=20  units   Yes Historical Provider, MD  insulin glargine (LANTUS) 100 UNIT/ML injection Inject 33 Units into the skin 2 (two) times daily. 08/07/11 08/06/12 Yes Lesle Chris Black, NP  memantine (NAMENDA) 10 MG tablet Take 10 mg by mouth 2 (two) times daily.     Yes Historical Provider, MD  tolterodine (DETROL LA) 4 MG 24 hr capsule Take 4 mg by mouth daily.   Yes Historical Provider, MD  travoprost, benzalkonium, (TRAVATAN) 0.004 % ophthalmic solution Place 1 drop into both eyes at bedtime.     Yes Historical Provider, MD  vitamin C (ASCORBIC ACID) 500 MG tablet Take 500 mg by mouth daily.    Yes Historical Provider, MD  atorvastatin (LIPITOR) 20 MG tablet Take 1 tablet (20 mg total) by mouth at bedtime. 08/07/11 08/06/12  Gwenyth Bender, NP  betaxolol (BETOPTIC-S) 0.25 % ophthalmic suspension Place 1 drop into both eyes 2 (two) times daily.      Historical Provider, MD   Physical Exam: Filed Vitals:   07/08/12 1559 07/08/12 1648 07/08/12 1921 07/08/12 1934  BP:   168/92 168/92  Pulse:   100 101  Temp:      TempSrc:      Resp:   24 22  SpO2: 93% 96% 95% 98%     General:  Well-developed and nourished.  Eyes: Anicteric no pallor.  ENT: No discharge from the ears eyes nose or mouth.  Neck: No mass felt.  Cardiovascular: S1-S2 heard.  Respiratory: Bilateral extremity wheeze heard. No rales.  Abdomen: Soft nontender bowel sounds present.  Skin: No rash seen.  Musculoskeletal: Moves all extremities.  Psychiatric: Patient is demented.  Neurologic: Alert awake follows commands.  Labs on Admission:  Basic Metabolic Panel:  Recent Labs Lab 07/08/12 1605  NA 142  K 3.4*  CL 103  CO2 25  GLUCOSE 178*  BUN 14  CREATININE 1.31  CALCIUM 8.7   Liver Function Tests: No results found for this basename: AST, ALT, ALKPHOS, BILITOT, PROT, ALBUMIN,  in the last 168 hours No results found for this basename: LIPASE, AMYLASE,  in the last 168 hours No results found for this basename: AMMONIA,  in  the last 168 hours CBC:  Recent Labs Lab 07/08/12 1605  WBC 8.9  HGB 13.7  HCT 40.3  MCV 94.4  PLT 279   Cardiac Enzymes: No results found for this basename: CKTOTAL, CKMB, CKMBINDEX, TROPONINI,  in the last 168 hours  BNP (last 3 results)  Recent Labs  07/29/11 0125 07/08/12 1605  PROBNP 396.6* 444.5*   CBG: No results found for this basename: GLUCAP,  in the last 168 hours  Radiological Exams on Admission: Dg Chest Port 1 View  07/08/2012  *RADIOLOGY REPORT*  Clinical Data: Shortness of breath, respiratory distress, CHF, hypertension, former smoker  PORTABLE CHEST - 1 VIEW  Comparison: Portable exam 1600  hours compared to 07/28/2011  Findings: Enlargement of cardiac silhouette with pulmonary vascular congestion. Tortuous aorta. No acute failure or consolidation. Minimal bibasilar atelectasis. No acute osseous findings, pleural effusion or pneumothorax.  IMPRESSION: Enlargement cardiac silhouette with minimal pulmonary vascular congestion. Minimal bibasilar atelectasis.   Original Report Authenticated By: Ulyses Southward, M.D.     EKG: Independently reviewed. Sinus rhythm.  Assessment/Plan Principal Problem:   SOB (shortness of breath) Active Problems:   Dementia   Hypertension   Diabetes mellitus   Gout   1. Shortness of breath most likely from COPD/asthmatic bronchitis - given patient's history of previous cigarette smoking patient most likely has COPD. Check ABG. May have also mild CHF but patient does not look like an obvious fluid overload. I have ordered one small dose of Lasix IV 20 mg and will continue with nebulizers steroids and place patient on antibiotics and check flow panel. Since patient's cardiac shadow is enlarged check 2-D echo. Patient is quite tachypneic but not in distress we will admit overnight to step down unit. 2. History of dysphagia and aspiration - I have ordered swallow evaluation. Diet recommendations per them. 3. Diabetes mellitus type 2 -  continue home medications for now with close followup of CBG. 4. Hypertension - continue home medications. 5. Hyperlipidemia - continue present medications. 6. Gout - continue present medications. 7. Dementia with possible depression - continue present medications and check carbamazepine and Depakote levels.   Patient's health care power of attorney in this patient's sister Ms. Jaquess who can be reached at [phone].  Patient's niece Ms. Marthenia Rolling can be reached at [phone].  Code Status: Full code as discussed with patient's niece.   Family Communication: Discuss with patient's niece.   Disposition Plan:  Admit to inpatient.    KAKRAKANDY,ARSHAD N. [facility] Pager [phone].  If 7PM-7AM, please contact night-coverage [email or web address] Password [facility] 07/08/2012, 8:12 PM

## 2012-07-08 NOTE — ED Notes (Signed)
Per EMS pt from [facility] with c/o shortness of breath/respiratory distress. Wheezing noted inspiratory. Hx of Asthma/CHF. Has had recent cold symptoms and got exacerbated approximately 1 hour prior to arrival. Pt on continuous neb on arrival. Pt received Albuterol 10 mg and Atrovent 1 mg. Solumedrol 125 mg IVP in route. IV 20G L Hand.

## 2012-07-09 ENCOUNTER — Inpatient Hospital Stay (HOSPITAL_COMMUNITY): Payer: Medicare Other

## 2012-07-09 DIAGNOSIS — J9600 Acute respiratory failure, unspecified whether with hypoxia or hypercapnia: Principal | ICD-10-CM

## 2012-07-09 DIAGNOSIS — J441 Chronic obstructive pulmonary disease with (acute) exacerbation: Secondary | ICD-10-CM

## 2012-07-09 DIAGNOSIS — J9601 Acute respiratory failure with hypoxia: Secondary | ICD-10-CM | POA: Diagnosis present

## 2012-07-09 DIAGNOSIS — F0390 Unspecified dementia without behavioral disturbance: Secondary | ICD-10-CM

## 2012-07-09 DIAGNOSIS — E119 Type 2 diabetes mellitus without complications: Secondary | ICD-10-CM

## 2012-07-09 DIAGNOSIS — I119 Hypertensive heart disease without heart failure: Secondary | ICD-10-CM | POA: Diagnosis present

## 2012-07-09 LAB — BASIC METABOLIC PANEL
BUN: 19 mg/dL (ref 6–23)
Chloride: 104 mEq/L (ref 96–112)
Glucose, Bld: 188 mg/dL — ABNORMAL HIGH (ref 70–99)
Potassium: 3.9 mEq/L (ref 3.5–5.1)

## 2012-07-09 LAB — CBC
HCT: 35.9 % — ABNORMAL LOW (ref 39.0–52.0)
Hemoglobin: 11.6 g/dL — ABNORMAL LOW (ref 13.0–17.0)
MCH: 30.5 pg (ref 26.0–34.0)
MCHC: 32.3 g/dL (ref 30.0–36.0)

## 2012-07-09 LAB — INFLUENZA PANEL BY PCR (TYPE A & B): Influenza A By PCR: NEGATIVE

## 2012-07-09 LAB — GLUCOSE, CAPILLARY: Glucose-Capillary: 134 mg/dL — ABNORMAL HIGH (ref 70–99)

## 2012-07-09 MED ORDER — PREDNISONE 20 MG PO TABS
40.0000 mg | ORAL_TABLET | Freq: Every day | ORAL | Status: DC
  Administered 2012-07-10 – 2012-07-11 (×2): 40 mg via ORAL
  Filled 2012-07-09 (×3): qty 2

## 2012-07-09 MED ORDER — INSULIN GLARGINE 100 UNIT/ML ~~LOC~~ SOLN
25.0000 [IU] | Freq: Two times a day (BID) | SUBCUTANEOUS | Status: DC
  Administered 2012-07-09 – 2012-07-11 (×4): 25 [IU] via SUBCUTANEOUS

## 2012-07-09 MED ORDER — POTASSIUM CHLORIDE CRYS ER 20 MEQ PO TBCR
20.0000 meq | EXTENDED_RELEASE_TABLET | Freq: Once | ORAL | Status: AC
  Administered 2012-07-09: 20 meq via ORAL
  Filled 2012-07-09: qty 1

## 2012-07-09 NOTE — Procedures (Addendum)
Objective Swallowing Evaluation: Modified Barium Swallowing Study  Patient Details  Name: [name] MRN: [id number] Date of Birth: 16-Oct-1940  Today's Date: 07/09/2012 Time: 4098-1191 SLP Time Calculation (min): 20 min  Past Medical History:  Past Medical History  Diagnosis Date  . Colon cancer   . Dementia   . CHF (congestive heart failure)   . Hypertension   . Hyperlipemia   . Diabetes mellitus   . History of CVA (cerebrovascular accident)   . History of colectomy   . Mild mental retardation    Past Surgical History:  Past Surgical History  Procedure Laterality Date  . Colectomy  08/2008   HPI:  [name] is a 72 y.o. male with known history of dementia, COPD, chronic kidney disease, hypertension, hyperlipidemia, diabetes mellitus type 2, gout and a history of dysphagia was brought to the ER from the nursing home because of worsening shortness of breath. As per  patient's niece, patient was having shortness of breath last 2 days which has gradually worsened and patient was found to be wheezing and having a nonproductive cough but was afebrile. In the ER chest x-ray only show enlargement of the cardiac shadow with no infiltrates and patient was afebrile but wheezing and was mild tachypneic. Patient was started on nebulizer and steroids and has been admitted for shortness of breath most likely from COPD exacerbation. As per patient's niece patient's diet was recently changed to liquid and they were suspecting possible aspiration.   Bedside swallow in March 2013 recommended nectar thick liquids.  MBS initially recommended, however pt. was not alert enough to participate during that hospital admission.  BSE deferred today with MBS.     Assessment / Plan / Recommendation Clinical Impression  Dysphagia Diagnosis: Mild oral phase dysphagia;Moderate oral phase dysphagia;Mild pharyngeal phase dysphagia Clinical impression: Pt. exhibited mild-moderate oral dysphagia and mild  sensory based pharyngeal dysphagia.  Swallows with all textures were initiated late at the level of the vallecula and/or pyriform sinuses (filling pyriforms before swallow initiated).  One instance of slight flash penetration with thin due to the delay in swallow initation.  No significant residue observed.  Slow oral prep and decreased mastication and manipulation with soft cereal bar (endentulous).  Pill consumed with applesauce without difficulty and no esophageal deficits observed.  Although no penetration or aspiration observed with straws, recommend pt. drink small sips via cup to decrease aspiration risk given delayed swallow.  Recommend he initiate a Dys 1 diet texture and thin liquids, no straws, pills whole in applesauce, sit upright during po's and full supervision.  ST will continue to follow for safety and efficiency with po's and for diet texture upgrade when appropriate.        Treatment Recommendation  Therapy as outlined in treatment plan below    Diet Recommendation Dysphagia 1 (Puree);Thin liquid   Liquid Administration via: Cup;No straw Medication Administration: Whole meds with puree Supervision: Patient able to self feed;Full supervision/cueing for compensatory strategies Compensations: Slow rate;Small sips/bites Postural Changes and/or Swallow Maneuvers: Seated upright 90 degrees    Other  Recommendations Oral Care Recommendations: Oral care BID   Follow Up Recommendations  Skilled Nursing facility    Frequency and Duration min 2x/week  2 weeks   Pertinent Vitals/Pain none    SLP Swallow Goals Patient will consume recommended diet without observed clinical signs of aspiration with: Moderate cueing Patient will utilize recommended strategies during swallow to increase swallowing safety with: Moderate cueing      Reason  for Referral Objectively evaluate swallowing function   Oral Phase Oral Preparation/Oral Phase Oral Phase: Impaired Oral - Solids Oral -  Mechanical Soft: Delayed oral transit;Weak lingual manipulation;Impaired mastication   Pharyngeal Phase Pharyngeal Phase Pharyngeal Phase: Impaired Pharyngeal - Honey Pharyngeal - Honey Teaspoon: Delayed swallow initiation;Premature spillage to valleculae Pharyngeal - Honey Cup: Delayed swallow initiation;Premature spillage to valleculae Pharyngeal - Nectar Pharyngeal - Nectar Teaspoon: Delayed swallow initiation;Premature spillage to pyriform sinuses;Premature spillage to valleculae Pharyngeal - Nectar Cup: Delayed swallow initiation;Premature spillage to pyriform sinuses;Premature spillage to valleculae Pharyngeal - Thin Pharyngeal - Thin Teaspoon: Delayed swallow initiation;Premature spillage to pyriform sinuses Pharyngeal - Thin Cup: Delayed swallow initiation;Premature spillage to pyriform sinuses;Penetration/Aspiration during swallow Penetration/Aspiration details (thin cup): Material enters airway, remains ABOVE vocal cords then ejected out Pharyngeal - Thin Straw: Delayed swallow initiation;Premature spillage to pyriform sinuses Pharyngeal - Solids Pharyngeal - Puree: Delayed swallow initiation;Premature spillage to valleculae Pharyngeal - Mechanical Soft: Within functional limits  Cervical Esophageal Phase        Cervical Esophageal Phase Cervical Esophageal Phase: Leonarda Salon         Darrow [employer]Ed [employer] [phone]  07/09/2012

## 2012-07-09 NOTE — Progress Notes (Signed)
[facility] Progress Note [place] TEAM 1 - Stepdown/ICU TEAM   [name] AVW:[id number] DOB: 13-Sep-1940 DOA: 07/08/2012 PCP: No primary Tymia Streb on file.  Brief narrative: [name] is a 72 y.o. male with known history of dementia, chronic kidney disease, hypertension, hyperlipidemia, diabetes mellitus type 2, gout and a history of dysphagia was brought to the ER from the nursing home because of worsening shortness of breath. As per patient's niece with whom I discussed over the phone, patient was having shortness of breath last 2 days which has gradually worsened and patient was found to be wheezing and having a nonproductive cough but was afebrile. In the ER chest x-ray only show enlargement of the cardiac shadow with no infiltrates and patient was afebrile but wheezing and was mild tachypneic. Patient was started on nebulizer and steroids and has been admitted for shortness of breath most likely from COPD exacerbation. As per patient's niece patient's diet was recently changed to liquid and they were suspecting possible aspiration. Patient is demented and does not provide any history.   Assessment/Plan: Principal Problem:   Acute respiratory failure with hypoxia -Suspected to be secondary to COPD exacerbation and now improved -Will transition Solu-Medrol to prednisone -Continue nebulizer treatments and Levaquin  Active Problems:   Dementia Stable without any significant symptoms of agitation while in the hospital    Hypertension BP slightly elevated-this may be secondary to steroids Continue current medical treatment    Diabetes mellitus Sugars noted to be slightly uncontrolled as well Will increase Lantus to 25 units twice a day    Gout Continue allopurinol  Grade 2 diastolic dysfunction and Moderate concentric LVH (left ventricular hypertrophy) due to hypertensive disease Continue home medications He does not appear to be fluid overloaded    CKD  (chronic kidney disease) stage 3 Stable  Anemia Likely due to chronic disease  -Hemoglobin unchanged from last year  Mild leukopenia Possibly due to viral infection-follow  Code Status: Full code Family Communication: None Disposition Plan: Transfer out of the step down  Consultants: None  Procedures: None  Antibiotics: Levaquin 3/3  DVT prophylaxis: Lovenox  HPI/Subjective: Patient is alert but unable to contribute anything to history-unable to tell me his name when asked.   Objective: Blood pressure 151/74, pulse 92, temperature 98.8 F (37.1 C), temperature source Oral, resp. rate 23, height 6\' 1"  (1.854 m), weight 95.7 kg (210 lb 15.7 oz), SpO2 98.00%.  Intake/Output Summary (Last 24 hours) at 07/09/12 1723 Last data filed at 07/09/12 0800  Gross per 24 hour  Intake    270 ml  Output    400 ml  Net   -130 ml     Exam: General: Confused, not agitated, does not follow commands No acute respiratory distress Lungs: Clear to auscultation bilaterally without wheezes or crackles Cardiovascular: Regular rate and rhythm without murmur gallop or rub normal S1 and S2 Abdomen: Nontender, nondistended, soft, bowel sounds positive, no rebound, no ascites, no appreciable mass Extremities: No significant cyanosis, clubbing, or edema bilateral lower extremities  Data Reviewed: Basic Metabolic Panel:  Recent Labs Lab 07/08/12 1605 07/09/12 0440  NA 142 143  K 3.4* 3.9  CL 103 104  CO2 25 27  GLUCOSE 178* 188*  BUN 14 19  CREATININE 1.31 1.50*  CALCIUM 8.7 8.2*   Liver Function Tests: No results found for this basename: AST, ALT, ALKPHOS, BILITOT, PROT, ALBUMIN,  in the last 168 hours No results found for this basename: LIPASE, AMYLASE,  in  the last 168 hours No results found for this basename: AMMONIA,  in the last 168 hours CBC:  Recent Labs Lab 07/08/12 1605 07/09/12 0440  WBC 8.9 3.9*  HGB 13.7 11.6*  HCT 40.3 35.9*  MCV 94.4 94.5  PLT 279 270    Cardiac Enzymes: No results found for this basename: CKTOTAL, CKMB, CKMBINDEX, TROPONINI,  in the last 168 hours BNP (last 3 results)  Recent Labs  07/29/11 0125 07/08/12 1605  PROBNP 396.6* 444.5*   CBG:  Recent Labs Lab 07/08/12 2151 07/09/12 0758 07/09/12 1214 07/09/12 1640  GLUCAP 182* 134* 111* 227*    Recent Results (from the past 240 hour(s))  MRSA PCR SCREENING     Status: None   Collection Time    07/08/12  9:20 PM      Result Value Range Status   MRSA by PCR NEGATIVE  NEGATIVE Final   Comment:            The GeneXpert MRSA Assay (FDA     approved for NASAL specimens     only), is one component of a     comprehensive MRSA colonization     surveillance program. It is not     intended to diagnose MRSA     infection nor to guide or     monitor treatment for     MRSA infections.     Studies:  Recent x-ray studies have been reviewed in detail by the Attending Physician  Scheduled Meds:  Scheduled Meds: . albuterol  2.5 mg Nebulization Q6H  . allopurinol  100 mg Oral Daily  . amLODipine  10 mg Oral QHS  . aspirin EC  81 mg Oral Daily  . atenolol  50 mg Oral Daily  . atorvastatin  20 mg Oral QHS  . betaxolol  1 drop Both Eyes BID  . budesonide (PULMICORT) nebulizer solution  0.25 mg Nebulization BID  . carbamazepine  100 mg Oral Daily   And  . carbamazepine  200 mg Oral QHS  . cloNIDine  0.3 mg Oral TID  . divalproex  500 mg Oral TID  . donepezil  10 mg Oral QHS  . enoxaparin (LOVENOX) injection  40 mg Subcutaneous Q24H  . fesoterodine  4 mg Oral QHS  . FLUoxetine  20 mg Oral Daily  . hydrALAZINE  100 mg Oral Q8H  . insulin aspart  0-15 Units Subcutaneous TID WC  . insulin glargine  20 Units Subcutaneous BID  . ipratropium  0.5 mg Nebulization Q6H  . levofloxacin (LEVAQUIN) IV  750 mg Intravenous Q24H  . memantine  10 mg Oral BID  . [START ON 07/10/2012] predniSONE  40 mg Oral Q breakfast  . sodium chloride  3 mL Intravenous Q12H  . sodium  chloride  3 mL Intravenous Q12H  . Travoprost (BAK Free)  1 drop Both Eyes QHS  . vitamin C  500 mg Oral Daily   Continuous Infusions: . albuterol 10 mg/hr (07/08/12 1650)    Time spent on care of this patient: 68   [facility]  [facility] Office  [phone] Pager - Text Page per Loretha Stapler as per below:  On-Call/Text Page:      [email or web address]      password [email or web address]  If 7PM-7AM, please contact night-coverage [email or web address] Password [email or web address] 07/09/2012, 5:23 PM   LOS: 1 day

## 2012-07-09 NOTE — Progress Notes (Signed)
Speech Language Pathology  Patient Details Name: [name] MRN: [id number] DOB: 07-Oct-1940 Today's Date: 07/09/2012 Time:  -     Chart reviewed.  Pt. With history of dysphagia and on Dys 1 diet and nectar thick liquids from last admission 3/13.  He needed an MBS at that time, however was not alert enough to participate. Deferred bedside swallow to MBS which is scheduled for today at 10:30  Royce Macadamia M.Ed [employer] [phone]  07/09/2012

## 2012-07-09 NOTE — Progress Notes (Signed)
  Echocardiogram 2D Echocardiogram has been performed.  [name] A 07/09/2012, 11:05 AM

## 2012-07-09 NOTE — Progress Notes (Signed)
Bladder scan performed with 283 mls noted. No complaints of dyscomfort, abdomen soft, will conitinue to monitor closely.

## 2012-07-09 NOTE — Care Management Note (Addendum)
    Page 1 of 1   07/11/2012     10:33:52 AM   CARE MANAGEMENT NOTE 07/11/2012  Patient:  [name], [name]   Account Number:  [id number]  Date Initiated:  07/09/2012  Documentation initiated by:  Junius Creamer  Subjective/Objective Assessment:   adm w resp distress     Action/Plan:   from nsg facility- from [facility]   Anticipated DC Date:  07/11/2012   Anticipated DC Plan:  SKILLED NURSING FACILITY  In-house referral  Clinical Social Worker      DC Planning Services  CM consult      Choice offered to / List presented to:             Status of service:  Completed, signed off Medicare Important Message given?   (If response is "NO", the following Medicare IM given date fields will be blank) Date Medicare IM given:   Date Additional Medicare IM given:    Discharge Disposition:  SKILLED NURSING FACILITY  Per UR Regulation:  Reviewed for med. necessity/level of care/duration of stay  If discussed at Long Length of Stay Meetings, dates discussed:    Comments:  07/11/12 10:33 Letha Cape RN, BSN [phone] patient for dc back to [facility] today, CSW following.  3/4 0914 debbie dowell rn,bsn

## 2012-07-09 NOTE — Progress Notes (Signed)
Spoke with patient's niece. She feels like patient has been aspirated at nursing home. Also wonders if patient hasn't extended his stroke at sometime while at the nursing home or maybe having TIA's. She states speech has been noted to be more garbled at times. Will continue to monitor closely.  Patient took his meds with applesauce and tolerated it well.

## 2012-07-09 NOTE — Progress Notes (Signed)
Inpatient Diabetes Program Recommendations  AACE/ADA: New Consensus Statement on Inpatient Glycemic Control (2013)  Target Ranges:  Prepandial:   less than 140 mg/dL      Peak postprandial:   less than 180 mg/dL (1-2 hours)      Critically ill patients:  140 - 180 mg/dL   Reason for Assessment:  Hyperglycemia  Results for [name], [name] (MRN [id number]) as of 07/09/2012 16:51  Ref. Range 07/08/2012 16:05 07/09/2012 04:40  Sodium Latest Range: 135-145 mEq/L 142 143  Potassium Latest Range: 3.5-5.1 mEq/L 3.4 (L) 3.9  Chloride Latest Range: 96-112 mEq/L 103 104  CO2 Latest Range: 19-32 mEq/L 25 27  BUN Latest Range: 6-23 mg/dL 14 19  Creatinine Latest Range: 0.50-1.35 mg/dL 4.09 8.11 (H)  Calcium Latest Range: 8.4-10.5 mg/dL 8.7 8.2 (L)  GFR calc non Af Amer Latest Range: >90 mL/min 53 (L) 45 (L)  GFR calc Af Amer Latest Range: >90 mL/min 61 (L) 52 (L)  Glucose Latest Range: 70-99 mg/dL 914 (H) 782 (H)   Results for [name], [name] (MRN [id number]) as of 07/09/2012 16:51  Ref. Range 08/07/2011 12:13 07/08/2012 21:51 07/09/2012 07:58 07/09/2012 12:14 07/09/2012 16:40  Glucose-Capillary Latest Range: 70-99 mg/dL 578 (H) 469 (H) 629 (H) 111 (H) 227 (H)   Inpatient Diabetes Program Recommendations HgbA1C: Please check HgbA1C to assess glycemic control prior to hospitalization  Note: Will follow.  Thank you. Ailene Ards, RD, LDN, CDE Inpatient Diabetes Coordinator [phone]

## 2012-07-09 NOTE — Clinical Social Work Psychosocial (Addendum)
    Clinical Social Work Department BRIEF PSYCHOSOCIAL ASSESSMENT 07/09/2012  Patient:  [name], [name]     Account Number:  [id number]     Admit date:  07/08/2012  Clinical Social Worker:  Hulan Fray  Date/Time:  07/09/2012 02:12 PM  Referred by:  Care Management  Date Referred:  07/09/2012 Referred for  Other - See comment   Other Referral:   Admit from facility- [facility]   Interview type:  Other - See comment Other interview type:   Jasmine December- Admissions Coordinator at [facility]    PSYCHOSOCIAL DATA Living Status:  FACILITY Admitted from facility:  [facility] Level of care:  Skilled Nursing Facility Primary support name:  [name] Primary support relationship to patient:  FAMILY Degree of support available:   supportive    CURRENT CONCERNS Current Concerns  Post-Acute Placement   Other Concerns:    SOCIAL WORK ASSESSMENT / PLAN Clinical Social Worker received referral from Case Management regarding patient being admitted from facility. CSW reviewed chart and noticed patient was admitted from [facility].    CSW called patient's niece, [name] and left a voice message for her to return call. CSW called Jasmine December, admissions coordinator of [facility] and she confirmed patient is a long term resident of their facility and is able to return when medically stable.    CSW will complete FL2 for MD's signature and continue to follow to facilitate discharge back to facility when medically stable.   Assessment/plan status:  Psychosocial Support/Ongoing Assessment of Needs Other assessment/ plan:   Information/referral to community resources:   Patient is long term resident of [facility]    PATIENT'S/FAMILY'S RESPONSE TO PLAN OF CARE: CSW left voice message for patient's niece to return call. 14:53pm CSW received call from patient's niece, [name] and she confirmed patient is a long term resident of [address] and is to return at  discharge.

## 2012-07-10 DIAGNOSIS — R4182 Altered mental status, unspecified: Secondary | ICD-10-CM

## 2012-07-10 DIAGNOSIS — N179 Acute kidney failure, unspecified: Secondary | ICD-10-CM

## 2012-07-10 LAB — BASIC METABOLIC PANEL
BUN: 30 mg/dL — ABNORMAL HIGH (ref 6–23)
CO2: 28 mEq/L (ref 19–32)
Calcium: 8.1 mg/dL — ABNORMAL LOW (ref 8.4–10.5)
Creatinine, Ser: 1.64 mg/dL — ABNORMAL HIGH (ref 0.50–1.35)
GFR calc non Af Amer: 40 mL/min — ABNORMAL LOW (ref >90)
Glucose, Bld: 138 mg/dL — ABNORMAL HIGH (ref 70–99)

## 2012-07-10 LAB — CBC
MCH: 31 pg (ref 26.0–34.0)
MCHC: 33.3 g/dL (ref 30.0–36.0)
MCV: 93.1 fL (ref 78.0–100.0)
Platelets: 245 10*3/uL (ref 150–400)
RBC: 3.48 MIL/uL — ABNORMAL LOW (ref 4.22–5.81)

## 2012-07-10 LAB — GLUCOSE, CAPILLARY: Glucose-Capillary: 222 mg/dL — ABNORMAL HIGH (ref 70–99)

## 2012-07-10 MED ORDER — SODIUM CHLORIDE 0.9 % IV BOLUS (SEPSIS)
500.0000 mL | Freq: Once | INTRAVENOUS | Status: AC
  Administered 2012-07-10: 500 mL via INTRAVENOUS

## 2012-07-10 MED ORDER — LEVOFLOXACIN IN D5W 750 MG/150ML IV SOLN
750.0000 mg | INTRAVENOUS | Status: DC
  Filled 2012-07-10: qty 150

## 2012-07-10 NOTE — Progress Notes (Signed)
[facility] Progress Note [place] TEAM 1 - Stepdown/ICU TEAM   MCCORMICK MACON ZOX:[id number] DOB: 08/24/1940 DOA: 07/08/2012 PCP: No primary Sakiyah Shur on file.  Brief narrative: 72 y.o. male with history of dementia, chronic kidney disease, hypertension, hyperlipidemia, diabetes mellitus type 2, gout and dysphagia who was brought to the ER from the nursing home because of worsening shortness of breath. As per patient's niece patient was having shortness of breath for 2 days which gradually worsened and patient was found to be wheezing and having a nonproductive cough but was afebrile. In the ER chest x-ray only showed enlargement of the cardiac shadow with no infiltrates and patient was afebrile but wheezing/tachypneic. Patient was started on nebulizer and steroids and admitted for shortness of breath most likely from COPD exacerbation. As per patient's niece patient's diet was recently changed to liquid and they were suspecting possible aspiration. Patient is demented and does not provide any history.  Assessment/Plan:  Acute respiratory failure with hypoxia Suspected to be secondary to COPD - slowly improving - transitioned Solu-Medrol to prednisone - continue nebulizer treatments and empiric Levaquin  Possible aspiration Cleared for D1 diet w/ thin liquids by SLP - have initiated - watch for acute resp difficulty  Dementia Stable without any significant symptoms of agitation while in the hospital  Hypertension BP not at goal - likely related to steroid dosing - follow w/o change today  Diabetes mellitus lantus increased 3/4 - no change in tx today - follow w/ decreasing steroid dose  Gout Continue allopurinol - clinically quiescent at this time   Grade 2 diastolic dysfunction and Moderate concentric LVH due to hypertensive disease Continue home medications - does not appear to be fluid overloaded  CKD (chronic kidney disease) stage 3 crt is slowly climbing - recheck in  AM  Anemia Likely due to chronic kidney disease - Hgb stable at this time   Mild leukopenia Possibly due to viral infection - resolved  Code Status: Full code Family Communication: None  Disposition Plan: Transfer to a medical bed  Consultants: None  Procedures: 07/09/2012 - TTE - estimated ejection fraction was in the range of 55% to 60% - Grade 2 DD  Antibiotics: Levaquin 3/3 >>  DVT prophylaxis: Lovenox  HPI/Subjective: The patient is alert and quite pleasant but significantly demented.  He is unable to provide any reliable history.  There is no family present at the time of my evaluation.  Objective: Blood pressure 136/75, pulse 72, temperature 97.8 F (36.6 C), temperature source Oral, resp. rate 20, height 6\' 1"  (1.854 m), weight 95.1 kg (209 lb 10.5 oz), SpO2 97.00%.  Intake/Output Summary (Last 24 hours) at 07/10/12 1041 Last data filed at 07/10/12 0800  Gross per 24 hour  Intake   1010 ml  Output      0 ml  Net   1010 ml    Exam: General: Confused, not agitated - no acute respiratory distress Lungs: Clear to auscultation bilaterally with exception to mild expiratory wheezes diffusely  Cardiovascular: Regular rate and rhythm without murmur gallop or rub normal S1 and S2 Abdomen: Nontender, nondistended, soft, bowel sounds positive, no rebound, no ascites, no appreciable mass Extremities: No significant cyanosis, clubbing, or edema bilateral lower extremities  Data Reviewed: Basic Metabolic Panel:  Recent Labs Lab 07/08/12 1605 07/09/12 0440 07/10/12 0450  NA 142 143 146*  K 3.4* 3.9 3.8  CL 103 104 109  CO2 25 27 28   GLUCOSE 178* 188* 138*  BUN 14 19 30*  CREATININE 1.31 1.50* 1.64*  CALCIUM 8.7 8.2* 8.1*   CBC:  Recent Labs Lab 07/08/12 1605 07/09/12 0440 07/10/12 0450  WBC 8.9 3.9* 7.5  HGB 13.7 11.6* 10.8*  HCT 40.3 35.9* 32.4*  MCV 94.4 94.5 93.1  PLT 279 270 245   BNP (last 3 results)  Recent Labs  07/29/11 0125 07/08/12 1605   PROBNP 396.6* 444.5*   CBG:  Recent Labs Lab 07/09/12 0758 07/09/12 1214 07/09/12 1640 07/09/12 2147 07/10/12 0820  GLUCAP 134* 111* 227* 222* 134*    Recent Results (from the past 240 hour(s))  MRSA PCR SCREENING     Status: None   Collection Time    07/08/12  9:20 PM      Result Value Range Status   MRSA by PCR NEGATIVE  NEGATIVE Final   Comment:            The GeneXpert MRSA Assay (FDA     approved for NASAL specimens     only), is one component of a     comprehensive MRSA colonization     surveillance program. It is not     intended to diagnose MRSA     infection nor to guide or     monitor treatment for     MRSA infections.     Studies:  Recent x-ray studies have been reviewed in detail by the Attending Physician  Scheduled Meds:  Scheduled Meds: . albuterol  2.5 mg Nebulization Q6H  . allopurinol  100 mg Oral Daily  . amLODipine  10 mg Oral QHS  . aspirin EC  81 mg Oral Daily  . atenolol  50 mg Oral Daily  . atorvastatin  20 mg Oral QHS  . betaxolol  1 drop Both Eyes BID  . budesonide (PULMICORT) nebulizer solution  0.25 mg Nebulization BID  . carbamazepine  100 mg Oral Daily   And  . carbamazepine  200 mg Oral QHS  . cloNIDine  0.3 mg Oral TID  . divalproex  500 mg Oral TID  . donepezil  10 mg Oral QHS  . enoxaparin (LOVENOX) injection  40 mg Subcutaneous Q24H  . fesoterodine  4 mg Oral QHS  . FLUoxetine  20 mg Oral Daily  . hydrALAZINE  100 mg Oral Q8H  . insulin aspart  0-15 Units Subcutaneous TID WC  . insulin glargine  25 Units Subcutaneous BID  . ipratropium  0.5 mg Nebulization Q6H  . levofloxacin (LEVAQUIN) IV  750 mg Intravenous Q24H  . memantine  10 mg Oral BID  . predniSONE  40 mg Oral Q breakfast  . sodium chloride  3 mL Intravenous Q12H  . sodium chloride  3 mL Intravenous Q12H  . Travoprost (BAK Free)  1 drop Both Eyes QHS  . vitamin C  500 mg Oral Daily   Continuous Infusions: . albuterol 10 mg/hr (07/08/12 1650)    Time  spent on care of this patient: 59   [facility]  [facility] Office  [phone] Pager - Text Page per Loretha Stapler as per below:  On-Call/Text Page:      [email or web address]      password [email or web address]  If 7PM-7AM, please contact night-coverage [email or web address] Password [email or web address] 07/10/2012, 10:41 AM   LOS: 2 days

## 2012-07-10 NOTE — Progress Notes (Signed)
Speech Language Pathology Dysphagia Treatment Patient Details Name: [name] MRN: [id number] DOB: 08-Jun-1940 Today's Date: 07/10/2012 Time: 0865-7846 SLP Time Calculation (min): 20 min  Assessment / Plan / Recommendation Clinical Impression  Pt. seen for dysphagia treatment for possible diet texture upgrade from Dys 1 (puree).  Pt. consumed thin water via cup with moderate verbal reminders to take smaller sips.  Immediate and delayed cough present with approximately 50% of liquid trials indicative of possible penetration.  RN reported assisting pt. with breakfast and meds without pt. demonstrating s/s aspiration.  Observed him with regular cracker as well as cracker mixed with applesauce with prolonged prep, manipulation and transit, therefore recommend he continue with Dys 1 diet and thin liquids.  Pt. must sit upright, no straws and small sips to decrease aspiration risk.  ST will continue to follow.    Diet Recommendation  Continue with Current Diet: Dysphagia 1 (puree);Thin liquid    SLP Plan Continue with current plan of care   Pertinent Vitals/Pain none   Swallowing Goals  SLP Swallowing Goals Patient will consume recommended diet without observed clinical signs of aspiration with: Moderate cueing Swallow Study Goal #1 - Progress: Progressing toward goal Patient will utilize recommended strategies during swallow to increase swallowing safety with: Moderate cueing Swallow Study Goal #2 - Progress: Progressing toward goal  General Temperature Spikes Noted: No Respiratory Status: Supplemental O2 delivered via (comment) Behavior/Cognition: Alert;Cooperative;Pleasant mood;Requires cueing Oral Cavity - Dentition: Edentulous Patient Positioning: Upright in chair  Oral Cavity - Oral Hygiene Does patient have any of the following "at risk" factors?: Tongue - coated;Oxygen therapy - cannula, mask, simple oxygen devices Brush patient's teeth BID with toothbrush (using toothpaste  with fluoride): Yes Patient is HIGH RISK - Oral Care Protocol followed (see row info): Yes   Dysphagia Treatment Treatment focused on: Skilled observation of diet tolerance;Patient/family/caregiver education Treatment Methods/Modalities: Skilled observation Patient observed directly with PO's: Yes Type of PO's observed: Dysphagia 1 (puree);Dysphagia 3 (soft);Dysphagia 2 (chopped) Feeding: Able to feed self;Needs set up Liquids provided via: Cup;No straw Oral Phase Signs & Symptoms: Prolonged oral phase;Prolonged bolus formation;Prolonged mastication Pharyngeal Phase Signs & Symptoms: Immediate cough;Delayed throat clear Type of cueing: Verbal;Visual Amount of cueing: Moderate        [name] [place].Ed [employer] [phone]  07/10/2012

## 2012-07-10 NOTE — Progress Notes (Signed)
ANTIBIOTIC CONSULT NOTE - FOLLOW UP  Pharmacy Consult for Levaquin Indication: COPD exac  No Known Allergies  Patient Measurements: Height: 6\' 1"  (185.4 cm) Weight: 209 lb 10.5 oz (95.1 kg) IBW/kg (Calculated) : 79.9 Adjusted Body Weight:    Vital Signs: Temp: 97.8 F (36.6 C) (03/05 0800) Temp src: Oral (03/05 0800) BP: 136/75 mmHg (03/05 0800) Pulse Rate: 72 (03/05 0800) Intake/Output from previous day: 03/04 0701 - 03/05 0700 In: 890 [P.O.:740; IV Piggyback:150] Out: 400 [Urine:400] Intake/Output from this shift: Total I/O In: 480 [P.O.:480] Out: -   Labs:  Recent Labs  07/08/12 1605 07/09/12 0440 07/10/12 0450  WBC 8.9 3.9* 7.5  HGB 13.7 11.6* 10.8*  PLT 279 270 245  CREATININE 1.31 1.50* 1.64*   Estimated Creatinine Clearance: 46 ml/min (by C-G formula based on Cr of 1.64). No results found for this basename: VANCOTROUGH, Leodis Binet, VANCORANDOM, GENTTROUGH, GENTPEAK, GENTRANDOM, TOBRATROUGH, TOBRAPEAK, TOBRARND, AMIKACINPEAK, AMIKACINTROU, AMIKACIN,  in the last 72 hours   Microbiology: Recent Results (from the past 720 hour(s))  MRSA PCR SCREENING     Status: None   Collection Time    07/08/12  9:20 PM      Result Value Range Status   MRSA by PCR NEGATIVE  NEGATIVE Final   Comment:            The GeneXpert MRSA Assay (FDA     approved for NASAL specimens     only), is one component of a     comprehensive MRSA colonization     surveillance program. It is not     intended to diagnose MRSA     infection nor to guide or     monitor treatment for     MRSA infections.    Anti-infectives   Start     Dose/Rate Route Frequency Ordered Stop   07/08/12 2200  levofloxacin (LEVAQUIN) IVPB 750 mg     750 mg 100 mL/hr over 90 Minutes Intravenous Every 24 hours 07/08/12 2129        Assessment: CC: SOB  PMH: colon ca, dementia, CHF, HTN, HLD, DM, CVA, MR  AC: Lovenox 40mg /d. Hgb down to 10.8. Plts 245.  ID: Levaquin D#3 for COPD exac with possible  aspiration.. NH resident. - Tmax 99.1, WBC 7.5, Scr 1.64 (up), normalized CrCl>50.  CV: HTN, HLD - vss on amlodipine, asa, atenolol, Lipitor, hydralazine, clonidine  Endo:DM-cbg 111/227, lantus, novolog reordered. Allopurinol for gout  Neuro : Baseline dementia. Stroke extension vs having new TIAs, garbled speech at times. Dementia/depression - cbz, depakote, fluoxetine, Namenda  Renal:CKD with scr worsening 1.5, lytes ok  Pulm:SOB/COPD exac. Steroids, nebs, prednisone  Heme/Onc: Watch CBC.  Best practices:lovenox  Plan: 1. Levaquin 750mg  IV Q48h changed   Misty Stanley Stillinger 07/10/2012,11:20 AM

## 2012-07-11 DIAGNOSIS — J690 Pneumonitis due to inhalation of food and vomit: Secondary | ICD-10-CM

## 2012-07-11 LAB — CARBAMAZEPINE, FREE AND TOTAL
Carbamazepine Metabolite -: 1.2 ug/mL (ref 0.2–2.0)
Carbamazepine Metabolite/: 0.4 ug/mL
Carbamazepine Metabolite: 0.8 ug/mL (ref 0.1–1.0)
Carbamazepine, Bound: 2.7 ug/mL
Carbamazepine, Total: 3.8 ug/mL — ABNORMAL LOW (ref 4.0–12.0)

## 2012-07-11 LAB — CBC
Hemoglobin: 12.3 g/dL — ABNORMAL LOW (ref 13.0–17.0)
MCH: 31.4 pg (ref 26.0–34.0)
MCHC: 33.5 g/dL (ref 30.0–36.0)
MCV: 93.6 fL (ref 78.0–100.0)
RBC: 3.92 MIL/uL — ABNORMAL LOW (ref 4.22–5.81)

## 2012-07-11 LAB — BASIC METABOLIC PANEL
BUN: 21 mg/dL (ref 6–23)
CO2: 30 mEq/L (ref 19–32)
Calcium: 8.3 mg/dL — ABNORMAL LOW (ref 8.4–10.5)
GFR calc non Af Amer: 62 mL/min — ABNORMAL LOW (ref >90)
Glucose, Bld: 119 mg/dL — ABNORMAL HIGH (ref 70–99)
Potassium: 3.7 mEq/L (ref 3.5–5.1)
Sodium: 145 mEq/L (ref 135–145)

## 2012-07-11 MED ORDER — IPRATROPIUM BROMIDE 0.02 % IN SOLN: 0.5000 mg | Freq: Four times a day (QID) | RESPIRATORY_TRACT | Status: DC

## 2012-07-11 MED ORDER — PREDNISONE 10 MG PO TABS: 40.0000 mg | ORAL_TABLET | Freq: Every day | ORAL | Status: DC

## 2012-07-11 MED ORDER — LEVOFLOXACIN 750 MG PO TABS: 750.0000 mg | ORAL_TABLET | ORAL | Status: AC

## 2012-07-11 MED ORDER — ALBUTEROL SULFATE (5 MG/ML) 0.5% IN NEBU: 2.5000 mg | INHALATION_SOLUTION | Freq: Four times a day (QID) | RESPIRATORY_TRACT | Status: DC

## 2012-07-11 MED ORDER — LEVOFLOXACIN 750 MG PO TABS
750.0000 mg | ORAL_TABLET | Freq: Every day | ORAL | Status: DC
  Filled 2012-07-11: qty 1

## 2012-07-11 MED ORDER — ALBUTEROL SULFATE (5 MG/ML) 0.5% IN NEBU: 2.5000 mg | INHALATION_SOLUTION | RESPIRATORY_TRACT | Status: DC | PRN

## 2012-07-11 NOTE — Progress Notes (Signed)
PT Cancellation Note  Patient Details Name: [name] MRN: [id number] DOB: [birth year]   Cancelled Treatment:     Defer PT eval to SNF--pt from SNF/returning to SNF today.   [facility] 07/11/2012, 4:08 PM

## 2012-07-11 NOTE — Plan of Care (Signed)
Problem: Phase I Progression Outcomes Goal: Patient Should Be Able To Teach Back Prior D/C (Patient should be able to teach back all education and instruction provided prior to D/C including: 1. Maintenance medication administration and usage. 2. Breakthrough medication usage. 3. Importance of home exercise. 4. COPD Action Plan. 5. Willingness to quit smoking annd steps to quit smoking as needed)  Outcome: Not Met (add Reason) Pt. confused

## 2012-07-11 NOTE — Discharge Summary (Signed)
Physician Discharge Summary  LAYN KYE WGN:[id number] DOB: 1941/03/31 DOA: 07/08/2012  PCP: No primary Demaree Liberto on file.  Admit date: 07/08/2012 Discharge date: 07/11/2012  Time spent: 45 minutes  Recommendations for Outpatient Follow-up:  Continue antibiotics, nebulizers and prednisone.  Monitor patient's improvement. Check CBGs and adjust insulin accordingly as steroids taper down.  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  Diet recommendation:  Pureed with thin liquids, significant aspiration risk  Filed Weights   07/09/12 0600 07/10/12 0500 07/11/12 0500  Weight: 95.7 kg (210 lb 15.7 oz) 95.1 kg (209 lb 10.5 oz) 97.9 kg (215 lb 13.3 oz)    History of present illness at the time of admission:  CHE RACHAL is a 72 y.o. male with known history of dementia, chronic kidney disease, hypertension, hyperlipidemia, diabetes mellitus type 2, gout and a history of dysphagia was brought to the ER from the nursing home because of worsening shortness of breath. As per patient's niece with whom I discussed over the phone, patient was having shortness of breath last 2 days which has gradually worsened and patient was found to be wheezing and having a nonproductive cough but was afebrile. In the ER chest x-ray only show enlargement of the cardiac shadow with no infiltrates and patient was afebrile but wheezing and was mild tachypneic. Patient was started on nebulizer and steroids and has been admitted for shortness of breath most likely from COPD exacerbation. As per patient's niece patient's diet was recently changed to liquid and they were suspecting possible aspiration.  Patient is demented and does not provide any history.   Hospital Course:   Acute respiratory failure with hypoxia  Suspected to be secondary to COPD he was treated with Levaquin, nebulizer treatments and IV steroids.  He slowly improved.  His IV steroids were changed to oral prednisone.  He is not completely well, but is stable, and able to continue re-cooperation at his skilled nursing facility.  Possible aspiration  Cleared for D1 diet w/ thin liquids by speech therapy.  Will need aspiration precautions.  Dementia  Stable without any significant symptoms of agitation while in the hospital.  Hypertension  BP moderately controlled.  Slightly elevated due to steroid dosing.  Continue amlodipine, hydralazine, atenolol.  Diabetes mellitus  Controlled on Lantus and Novolog.  Continue insulin dosing as prior to admission.  Gout  Continue allopurinol - clinically quiescent at this time   Grade 2 diastolic dysfunction and Moderate concentric LVH due to hypertensive disease  Continue home medications - does not appear to be fluid overloaded   CKD (chronic kidney disease) stage 3  Creatinine at baseline.  Anemia  Likely due to chronic kidney disease - Hgb stable at this time    Procedures: 2D echocardiogram Study Conclusions  Left ventricle: The cavity size was normal. There was moderate concentric hypertrophy. Systolic function was normal. The estimated ejection fraction was in the range of 55% to 60%. Wall motion was normal; there were no regional wall motion abnormalities. Features are consistent with a pseudonormal left ventricular filling pattern, with concomitant abnormal relaxation and increased filling pressure (grade 2 diastolic dysfunction). Doppler parameters are consistent with elevated mean left atrial filling pressure.  Discharge Exam: Filed Vitals:   07/11/12 0450 07/11/12 0500 07/11/12 1021 07/11/12 1045  BP: 144/79   143/69  Pulse: 58   72  Temp: 97.3 F  (36.3 C)     TempSrc: Oral     Resp: 16     Height:      Weight:  97.9 kg (215 lb 13.3 oz)    SpO2: 99%  95%     General: Awake, Alert, obese, elderly male, lying comfortably in bed.  HOB elevated. HEENT:  Mucous membranes dry, tongue with whitish dry coating. Cardiovascular: rrr no m/r/g, no lower extremity edema Respiratory: Mod expiratory wheeze, with phlegm in pharynx worsening breath sounds, no accessory muscle use. Abdomen:  Obese, NT, ND, +BS, No Masses   Discharge Instructions      Discharge Orders   Future Orders Complete By Expires     Increase activity slowly  As directed         Medication List    TAKE these medications       acetaminophen 325 MG tablet  Commonly known as:  TYLENOL  Take 650 mg by mouth every 6 (six) hours as needed for pain. For pain     albuterol (5 MG/ML) 0.5% nebulizer solution  Commonly known as:  PROVENTIL  Take 0.5 mLs (2.5 mg total) by nebulization every 2 (two) hours as needed for wheezing.     albuterol (5 MG/ML) 0.5% nebulizer solution  Commonly known as:  PROVENTIL  Take 0.5 mLs (2.5 mg total) by nebulization every 6 (six) hours.     allopurinol 100 MG tablet  Commonly known as:  ZYLOPRIM  Take 100 mg by mouth daily.     amLODipine 10 MG tablet  Commonly known as:  NORVASC  Take 10 mg by mouth every evening. Hold for SBP<110     aspirin 81 MG tablet  Take 81 mg by mouth daily.     atenolol 50 MG tablet  Commonly known as:  TENORMIN  Take 50 mg by mouth daily.     atorvastatin 20 MG tablet  Commonly known as:  LIPITOR  Take 1 tablet (20 mg total) by mouth at bedtime.     AZO-CRANBERRY PO  Take 475 mg by mouth 2 (two) times daily.     betaxolol 0.25 % ophthalmic suspension  Commonly known as:  BETOPTIC-S  Place 1 drop into both eyes 2 (two) times daily.     carbamazepine 100 MG chewable tablet  Commonly known as:  TEGRETOL  Chew 100-200 mg by mouth 2 (two) times daily. Takes 100 mg in the am and 200 mg every  evening     cloNIDine 0.3 MG tablet  Commonly known as:  CATAPRES  Take 0.3 mg by mouth 3 (three) times daily.     divalproex 500 MG DR tablet  Commonly known as:  DEPAKOTE  Take 500 mg by mouth 3 (three) times daily.     donepezil 10 MG tablet  Commonly known as:  ARICEPT  Take 10 mg by mouth at bedtime.     FLUoxetine 20 MG tablet  Commonly known as:  PROZAC  Take 20 mg by mouth daily.     hydrALAZINE 100 MG tablet  Commonly known as:  APRESOLINE  Take 1 tablet (100 mg total) by mouth every 8 (eight) hours.     insulin aspart 100 UNIT/ML injection  Commonly known as:  novoLOG  Inject into the skin 3 (three) times daily before meals. Sliding scale  70-120 no insulin 121-150=3 units 151-200=4 units 201-250=7 units 251-300=11 units 351-400=20 units     insulin glargine 100 UNIT/ML injection  Commonly known as:  LANTUS  Inject 33 Units into the skin 2 (two) times daily.     ipratropium 0.02 % nebulizer solution  Commonly known as:  ATROVENT  Take 2.5 mLs (0.5 mg total) by nebulization every 6 (six) hours.     levofloxacin 750 MG tablet  Commonly known as:  LEVAQUIN  Take 1 tablet (750 mg total) by mouth every other day.  Start taking on:  07/12/2012     memantine 10 MG tablet  Commonly known as:  NAMENDA  Take 10 mg by mouth 2 (two) times daily.     predniSONE 10 MG tablet  Commonly known as:  DELTASONE  Take 4 tablets (40 mg total) by mouth daily with breakfast. Take 4 tabs for 2 days  Then take 3 tabs for 2 days  Then take 2 tabs for 2 days   Then take 1 tab for 2 days   Then stop.     tolterodine 4 MG 24 hr capsule  Commonly known as:  DETROL LA  Take 4 mg by mouth daily.     travoprost (benzalkonium) 0.004 % ophthalmic solution  Commonly known as:  TRAVATAN  Place 1 drop into both eyes at bedtime.     vitamin C 500 MG tablet  Commonly known as:  ASCORBIC ACID  Take 500 mg by mouth daily.          The results of significant diagnostics from  this hospitalization (including imaging, microbiology, ancillary and laboratory) are listed below for reference.    Significant Diagnostic Studies: Dg Chest Port 1 View  07/08/2012  *RADIOLOGY REPORT*  Clinical Data: Shortness of breath, respiratory distress, CHF, hypertension, former smoker  PORTABLE CHEST - 1 VIEW  Comparison: Portable exam 1600 hours compared to 07/28/2011  Findings: Enlargement of cardiac silhouette with pulmonary vascular congestion. Tortuous aorta. No acute failure or consolidation. Minimal bibasilar atelectasis. No acute osseous findings, pleural effusion or pneumothorax.  IMPRESSION: Enlargement cardiac silhouette with minimal pulmonary vascular congestion. Minimal bibasilar atelectasis.   Original Report Authenticated By: Ulyses Southward, M.D.    Dg Swallowing Func-speech Pathology  07/10/2012  [name] [place], CCC-SLP     07/10/2012 11:10 AM Objective Swallowing Evaluation: Modified Barium Swallowing Study   Patient Details  Name: [name] MRN: [id number] Date of Birth: 1940-06-27  Today's Date: 07/09/2012 Time: 1035-1055 SLP Time Calculation (min): 20 min  Past Medical History:  Past Medical History  Diagnosis Date  . Colon cancer   . Dementia   . CHF (congestive heart failure)   . Hypertension   . Hyperlipemia   . Diabetes mellitus   . History of CVA (cerebrovascular accident)   . History of colectomy   . Mild mental retardation    Past Surgical History:  Past Surgical History  Procedure Laterality Date  . Colectomy  08/2008   HPI:  JAKIN PAVAO is a 72 y.o. male with known history of  dementia, COPD, chronic kidney disease, hypertension,  hyperlipidemia, diabetes mellitus type 2, gout and a history of  dysphagia was brought to the ER from the nursing home because of  worsening shortness of breath. As per  patient's niece, patient  was having shortness of breath last 2 days which has gradually  worsened and patient was found to be wheezing and having a  nonproductive cough but  was afebrile.  In the ER chest x-ray only  show enlargement of the cardiac shadow with no infiltrates and  patient was afebrile but wheezing and was mild tachypneic.  Patient was started on nebulizer and steroids and has been  admitted for shortness of breath most likely from COPD  exacerbation. As per patient's niece patient's diet was recently  changed to liquid and they were suspecting possible aspiration.    Bedside swallow in March 2013 recommended nectar thick liquids.   MBS initially recommended, however pt. was not alert enough to  participate during that hospital admission.  BSE deferred today  with MBS.     Assessment / Plan / Recommendation Clinical Impression  Dysphagia Diagnosis: Mild oral phase dysphagia;Moderate oral  phase dysphagia;Mild pharyngeal phase dysphagia Clinical impression: Pt. exhibited mild-moderate oral dysphagia  and mild sensory based pharyngeal dysphagia.  Swallows with all  textures were initiated late at the level of the vallecula and/or  pyriform sinuses (filling pyriforms before swallow initiated).   One instance of slight flash penetration with thin due to the  delay in swallow initation.  No significant residue observed.   Slow oral prep and decreased mastication and manipulation with  soft cereal bar (endentulous).  Pill consumed with applesauce  without difficulty and no esophageal deficits observed.  Although  no penetration or aspiration observed with straws, recommend pt.  drink small sips via cup to decrease aspiration risk given  delayed swallow.  Recommend he initiate a Dys 1 diet texture and  thin liquids, no straws, pills whole in applesauce, sit upright  during po's and full supervision.  ST will continue to follow for  safety and efficiency with po's and for diet texture upgrade when  appropriate.        Treatment Recommendation  Therapy as outlined in treatment plan below    Diet Recommendation Dysphagia 1 (Puree);Thin liquid   Liquid Administration via: Cup;No  straw Medication Administration: Whole meds with puree Supervision: Patient able to self feed;Full supervision/cueing  for compensatory strategies Compensations: Slow rate;Small sips/bites Postural Changes and/or Swallow Maneuvers: Seated upright 90  degrees    Other  Recommendations Oral Care Recommendations: Oral care BID   Follow Up Recommendations  Skilled Nursing facility    Frequency and Duration min 2x/week  2 weeks   Pertinent Vitals/Pain none    SLP Swallow Goals Patient will consume recommended diet without observed clinical  signs of aspiration with: Moderate cueing Patient will utilize recommended strategies during swallow to  increase swallowing safety with: Moderate cueing      Reason for Referral Objectively evaluate swallowing function   Oral Phase Oral Preparation/Oral Phase Oral Phase: Impaired Oral - Solids Oral - Mechanical Soft: Delayed oral transit;Weak lingual  manipulation;Impaired mastication   Pharyngeal Phase Pharyngeal Phase Pharyngeal Phase: Impaired Pharyngeal - Honey Pharyngeal - Honey Teaspoon: Delayed swallow initiation;Premature  spillage to valleculae Pharyngeal - Honey Cup: Delayed swallow initiation;Premature  spillage to valleculae Pharyngeal - Nectar Pharyngeal - Nectar Teaspoon: Delayed swallow  initiation;Premature spillage to pyriform sinuses;Premature  spillage to valleculae Pharyngeal - Nectar Cup: Delayed swallow initiation;Premature  spillage to pyriform sinuses;Premature spillage to valleculae Pharyngeal - Thin Pharyngeal - Thin Teaspoon: Delayed swallow initiation;Premature  spillage to pyriform sinuses Pharyngeal - Thin Cup: Delayed swallow initiation;Premature  spillage to pyriform sinuses;Penetration/Aspiration during  swallow Penetration/Aspiration details (thin cup): Material enters  airway, remains ABOVE vocal cords then ejected out Pharyngeal - Thin Straw: Delayed swallow initiation;Premature  spillage to pyriform sinuses Pharyngeal - Solids Pharyngeal - Puree:  Delayed swallow initiation;Premature spillage  to valleculae Pharyngeal - Mechanical Soft: Within functional limits  Cervical Esophageal Phase        Cervical Esophageal Phase Cervical Esophageal Phase: [place]         Darrow Bussing.Ed CCC-SLP Pager [phone]  07/09/2012      Microbiology: Recent Results (from the past 240 hour(s))  MRSA PCR SCREENING     Status: None   Collection Time    07/08/12  9:20 PM      Result Value Range Status   MRSA by PCR NEGATIVE  NEGATIVE Final   Comment:            The GeneXpert MRSA Assay (FDA     approved for NASAL specimens     only), is one component of a     comprehensive MRSA colonization     surveillance program. It is not     intended to diagnose MRSA     infection nor to guide or     monitor treatment for     MRSA infections.     Labs: Basic Metabolic Panel:  Recent Labs Lab 07/08/12 1605 07/09/12 0440 07/10/12 0450 07/11/12 0609  NA 142 143 146* 145  K 3.4* 3.9 3.8 3.7  CL 103 104 109 106  CO2 25 27 28 30   GLUCOSE 178* 188* 138* 119*  BUN 14 19 30* 21  CREATININE 1.31 1.50* 1.64* 1.14  CALCIUM 8.7 8.2* 8.1* 8.3*   CBC:  Recent Labs Lab 07/08/12 1605 07/09/12 0440 07/10/12 0450 07/11/12 0609  WBC 8.9 3.9* 7.5 7.4  HGB 13.7 11.6* 10.8* 12.3*  HCT 40.3 35.9* 32.4* 36.7*  MCV 94.4 94.5 93.1 93.6  PLT 279 270 245 245    BNP (last 3 results)  Recent Labs  07/29/11 0125 07/08/12 1605  PROBNP 396.6* 444.5*   CBG:  Recent Labs Lab 07/10/12 0820 07/10/12 1235 07/10/12 1947 07/10/12 2316 07/11/12 0801  GLUCAP 134* 161* 208* 192* 95    Signed:  Conley Canal [phone] [facility] 07/11/2012, 11:52 AM

## 2012-07-11 NOTE — Progress Notes (Signed)
[name] discharged Skilled nursing facility per MD order.  Report called to receiving [name] at [facility].     Medication List    TAKE these medications       acetaminophen 325 MG tablet  Commonly known as:  TYLENOL  Take 650 mg by mouth every 6 (six) hours as needed for pain. For pain     albuterol (5 MG/ML) 0.5% nebulizer solution  Commonly known as:  PROVENTIL  Take 0.5 mLs (2.5 mg total) by nebulization every 2 (two) hours as needed for wheezing.     albuterol (5 MG/ML) 0.5% nebulizer solution  Commonly known as:  PROVENTIL  Take 0.5 mLs (2.5 mg total) by nebulization every 6 (six) hours.     allopurinol 100 MG tablet  Commonly known as:  ZYLOPRIM  Take 100 mg by mouth daily.     amLODipine 10 MG tablet  Commonly known as:  NORVASC  Take 10 mg by mouth every evening. Hold for SBP<110     aspirin 81 MG tablet  Take 81 mg by mouth daily.     atenolol 50 MG tablet  Commonly known as:  TENORMIN  Take 50 mg by mouth daily.     atorvastatin 20 MG tablet  Commonly known as:  LIPITOR  Take 1 tablet (20 mg total) by mouth at bedtime.     AZO-CRANBERRY PO  Take 475 mg by mouth 2 (two) times daily.     betaxolol 0.25 % ophthalmic suspension  Commonly known as:  BETOPTIC-S  Place 1 drop into both eyes 2 (two) times daily.     carbamazepine 100 MG chewable tablet  Commonly known as:  TEGRETOL  Chew 100-200 mg by mouth 2 (two) times daily. Takes 100 mg in the am and 200 mg every evening     cloNIDine 0.3 MG tablet  Commonly known as:  CATAPRES  Take 0.3 mg by mouth 3 (three) times daily.     divalproex 500 MG DR tablet  Commonly known as:  DEPAKOTE  Take 500 mg by mouth 3 (three) times daily.     donepezil 10 MG tablet  Commonly known as:  ARICEPT  Take 10 mg by mouth at bedtime.     FLUoxetine 20 MG tablet  Commonly known as:  PROZAC  Take 20 mg by mouth daily.     hydrALAZINE 100 MG tablet  Commonly known as:  APRESOLINE  Take 1 tablet  (100 mg total) by mouth every 8 (eight) hours.     insulin aspart 100 UNIT/ML injection  Commonly known as:  novoLOG  Inject into the skin 3 (three) times daily before meals. Sliding scale  70-120 no insulin 121-150=3 units 151-200=4 units 201-250=7 units 251-300=11 units 351-400=20 units     insulin glargine 100 UNIT/ML injection  Commonly known as:  LANTUS  Inject 33 Units into the skin 2 (two) times daily.     ipratropium 0.02 % nebulizer solution  Commonly known as:  ATROVENT  Take 2.5 mLs (0.5 mg total) by nebulization every 6 (six) hours.     levofloxacin 750 MG tablet  Commonly known as:  LEVAQUIN  Take 1 tablet (750 mg total) by mouth every other day.  Start taking on:  07/12/2012     memantine 10 MG tablet  Commonly known as:  NAMENDA  Take 10 mg by mouth 2 (two) times daily.     predniSONE 10 MG tablet  Commonly known as:  DELTASONE  Take 4  tablets (40 mg total) by mouth daily with breakfast. Take 4 tabs for 2 days  Then take 3 tabs for 2 days  Then take 2 tabs for 2 days   Then take 1 tab for 2 days   Then stop.     tolterodine 4 MG 24 hr capsule  Commonly known as:  DETROL LA  Take 4 mg by mouth daily.     travoprost (benzalkonium) 0.004 % ophthalmic solution  Commonly known as:  TRAVATAN  Place 1 drop into both eyes at bedtime.     vitamin Gamble 500 MG tablet  Commonly known as:  ASCORBIC ACID  Take 500 mg by mouth daily.        Patients skin is clean, dry and intact, no evidence of skin break down. IV site discontinued and catheter remains intact. Site without signs and symptoms of complications. Dressing and pressure applied.  Patient transported on a stretcher by non emergent EMS,  no distress noted upon discharge.  [name], [name] 07/11/2012 5:28 PM

## 2012-07-11 NOTE — Clinical Social Work Note (Signed)
CSW was consulted to complete discharge of patient. Pt to transfer to [facility] today via [facility]. Facility and family are aware of d/c. D/C packet complete with chart copy, signed FL2, and signed hard Rx.  CSW signing off as no other CSW needs identified at this time.  Caitlin Mayton, LCSWA [facility] Clinical Social Worker Contact #: [phone]  

## 2012-07-11 NOTE — Discharge Summary (Signed)
Addendum  Patient seen and examined, chart and data base reviewed.  I agree with the above assessment and discharge plan.  For full details please see Mrs. Algis Downs PA note.  Acute respiratory failure with hypoxia secondary to possible aspiration. Discharged on Levaquin and prednisone taper.   Clint Lipps, MD [facility] Pager: [phone] 07/11/2012, 12:49 PM

## 2012-07-11 NOTE — Progress Notes (Signed)
Speech Language Pathology Dysphagia Treatment Patient Details Name: CHAMAR BROUGHTON MRN: [id number] DOB: 06-27-1940 Today's Date: 07/11/2012 Time: 4098-1191 SLP Time Calculation (min): 15 min  Assessment / Plan / Recommendation Clinical Impression  Dysphagia treatment with emphasis on safety and efficiency with diet texture and thin liquids.  SLP arrived to room and pt. noted to have continuous hard coughs as he had just finished eating lunch.  Speech is dysarthric therefore difficult to ascertain if pt. was consuming puree/liquids or  neither just prior to coughing episode.  At end of session he was observed to have sudden cough 1-2 minutes after cup sips tea resembling a possible esophageal impairment, however no deficits observed with brief esophageal scan during MBS on 3/4.  Aspiration risk appeared small during MBS (flash penetration x1) with thin, however dysphagia is dynamic and can fluctuate given pt.'s alertness, compliance with strategies etc.  Pt. possibly discharging soon to SNF.  Recommend continue ST for continued education of swallow strategies.  If he continues to exhibit s/s aspiration, he may benefit from nectar thick liquids.         Diet Recommendation  Continue with Current Diet: Dysphagia 1 (puree);Thin liquid    SLP Plan Continue with current plan of care   Pertinent Vitals/Pain none   Swallowing Goals  SLP Swallowing Goals Patient will consume recommended diet without observed clinical signs of aspiration with: Moderate cueing Swallow Study Goal #1 - Progress: Progressing toward goal Patient will utilize recommended strategies during swallow to increase swallowing safety with: Moderate cueing Swallow Study Goal #2 - Progress: Progressing toward goal  General Temperature Spikes Noted: No Respiratory Status: Room air Behavior/Cognition: Alert;Cooperative;Pleasant mood;Requires cueing Oral Cavity - Dentition: Edentulous Patient Positioning: Upright in bed  Oral  Cavity - Oral Hygiene Does patient have any of the following "at risk" factors?: Tongue - coated;Oxygen therapy - cannula, mask, simple oxygen devices Brush patient's teeth BID with toothbrush (using toothpaste with fluoride): Yes Patient is AT RISK - Oral Care Protocol followed (see row info): Yes   Dysphagia Treatment Treatment focused on: Skilled observation of diet tolerance;Utilization of compensatory strategies Treatment Methods/Modalities: Skilled observation Patient observed directly with PO's: Yes Type of PO's observed: Dysphagia 1 (puree);Thin liquids Feeding: Able to feed self;Needs assist;Needs set up Liquids provided via: Cup;No straw Oral Phase Signs & Symptoms: Anterior loss/spillage Pharyngeal Phase Signs & Symptoms: Delayed cough;Watery eyes Type of cueing: Verbal;Visual Amount of cueing: Moderate        [name] Leando[employer] [phone]  07/11/2012

## 2012-07-11 NOTE — Progress Notes (Signed)
OT Cancellation Note  Patient Details Name: MANNING LUNA MRN: [id number] DOB: [birth year]   Cancelled Treatment:    Reason Eval/Treat Not Completed: Other (comment) Pt SNF resident and will return to SNF. Will defer any OT needs to SNF. OT signing off.  [facility], OTR/L  [phone] 07/11/2012 07/11/2012, 8:46 AM

## 2012-08-12 ENCOUNTER — Non-Acute Institutional Stay (SKILLED_NURSING_FACILITY): Payer: Medicare Other | Admitting: Adult Health

## 2012-08-12 DIAGNOSIS — E870 Hyperosmolality and hypernatremia: Secondary | ICD-10-CM

## 2012-08-12 DIAGNOSIS — E119 Type 2 diabetes mellitus without complications: Secondary | ICD-10-CM

## 2012-08-28 ENCOUNTER — Non-Acute Institutional Stay (SKILLED_NURSING_FACILITY): Payer: Medicare Other | Admitting: Internal Medicine

## 2012-08-28 DIAGNOSIS — I10 Essential (primary) hypertension: Secondary | ICD-10-CM

## 2012-08-28 DIAGNOSIS — G309 Alzheimer's disease, unspecified: Secondary | ICD-10-CM

## 2012-08-28 DIAGNOSIS — E7800 Pure hypercholesterolemia, unspecified: Secondary | ICD-10-CM

## 2012-08-28 DIAGNOSIS — E119 Type 2 diabetes mellitus without complications: Secondary | ICD-10-CM

## 2012-08-28 DIAGNOSIS — F0280 Dementia in other diseases classified elsewhere without behavioral disturbance: Secondary | ICD-10-CM

## 2012-09-09 NOTE — Progress Notes (Signed)
Patient ID: [name], male   DOB: 1940/06/01, 72 y.o.   MRN: [id number]        PROGRESS NOTE  DATE:  08/28/2012  FACILITY: [facility]   LEVEL OF CARE: SNF  Routine Visit  CHIEF COMPLAINT:  Manage diabetes mellitus, hypertension and hyperlipidemia.   HISTORY OF PRESENT ILLNESS:  REASSESSMENT OF ONGOING PROBLEM(S):  DM:pt's DM remains stable.  Pt denies polyuria, polydipsia, polyphagia, changes in vision or hypoglycemic episodes.  No complications noted from the medication presently being used.  Last hemoglobin A1c is: 05/2012:  Hemoglobin A1C 7.4.  HTN: Pt 's HTN remains stable.  Denies CP, sob, DOE, pedal edema, headaches, dizziness or visual disturbances.  No complications from the medications currently being used.  Last BP : 156/99, 158/92, 156/82.  HYPERLIPIDEMIA: No complications from the medications presently being used. Last fasting lipid panel showed : 08/2012:  Fasting lipid panel normal.    PAST MEDICAL HISTORY : Reviewed.  No changes.  CURRENT MEDICATIONS: Reviewed per [facility]  REVIEW OF SYSTEMS:  Difficult to obtain due to dementia.   PHYSICAL EXAMINATION  VS:  T 97       P  82    RR 20      BP     POX 98% room air      WT (Lb)  GENERAL: no acute distress, normal body habitus EYES: conjunctivae normal, sclerae normal, normal eye lids NECK: supple, trachea midline, no neck masses, no thyroid tenderness, no thyromegaly LYMPHATICS: no LAN in the neck, no supraclavicular LAN RESPIRATORY: breathing is even & unlabored, BS CTAB CARDIAC: RRR, no murmur,no extra heart sounds EDEMA/VARICOSITIES:  +1 bilateral lower extremity edema  ARTERIAL:  pedal pulses +1  GI: abdomen soft, normal BS, no masses, no tenderness, no hepatomegaly, no splenomegaly PSYCHIATRIC: the patient is alert & oriented to person, affect & behavior appropriate  LABS/RADIOLOGY: 08/2012:  Creatinine 1.24, otherwise BMP normal.   Albumin 3.4, otherwise CMP normal.     Hemoglobin 12.5, MCV  92.4, otherwise CBC normal.    05/2012:  Iron panel normal.  TSH 3.954.  Vitamin B12 level 385, folate 8.6, ferritin 136.  ASSESSMENT/PLAN:  Diabetes mellitus.  Well controlled, but Lantus had to be decreased due to hypoglycemia.   Hypertension.  Uncontrolled.  Increase Tenormin to 100 mg q.d.   Hyperlipidemia.  Well controlled.    Alzheimer's dementia.  Moderate advanced.    Seizure disorder.  Well controlled.    Urinary incontinence.  Continue Detrol LA.   Gout.  Continue allopurinol.    Depression.  Continue current antidepressant.   Check Tegretol level and Depakote level.  CPT CODE: 40981

## 2012-09-16 ENCOUNTER — Non-Acute Institutional Stay (SKILLED_NURSING_FACILITY): Payer: Medicare Other | Admitting: Adult Health

## 2012-09-16 DIAGNOSIS — B370 Candidal stomatitis: Secondary | ICD-10-CM

## 2012-09-25 ENCOUNTER — Non-Acute Institutional Stay (SKILLED_NURSING_FACILITY): Payer: Medicare Other | Admitting: Internal Medicine

## 2012-09-25 DIAGNOSIS — E7800 Pure hypercholesterolemia, unspecified: Secondary | ICD-10-CM

## 2012-09-25 DIAGNOSIS — E119 Type 2 diabetes mellitus without complications: Secondary | ICD-10-CM

## 2012-09-25 DIAGNOSIS — I10 Essential (primary) hypertension: Secondary | ICD-10-CM

## 2012-09-25 DIAGNOSIS — F0390 Unspecified dementia without behavioral disturbance: Secondary | ICD-10-CM

## 2012-09-28 DIAGNOSIS — E7800 Pure hypercholesterolemia, unspecified: Secondary | ICD-10-CM | POA: Insufficient documentation

## 2012-09-28 NOTE — Progress Notes (Signed)
Patient ID: [name], male   DOB: 01-Jul-1940, 72 y.o.   MRN: [id number]        PROGRESS NOTE  DATE:  09/25/2012  FACILITY: [facility]   LEVEL OF CARE: SNF  Routine Visit  CHIEF COMPLAINT:  Manage diabetes mellitus, hypertension and hyperlipidemia.   HISTORY OF PRESENT ILLNESS:  REASSESSMENT OF ONGOING PROBLEM(S):  DM:pt's DM remains stable.  Pt denies polyuria, polydipsia, polyphagia, changes in vision or hypoglycemic episodes.  No complications noted from the medication presently being used.  Last hemoglobin A1c is: 05/2012:  Hemoglobin A1C 7.4.  HTN: Pt 's HTN remains stable.  Denies CP, sob, DOE, pedal edema, headaches, dizziness or visual disturbances.  No complications from the medications currently being used.  Last BP :134/70, 156/99, 158/92, 156/82.  HYPERLIPIDEMIA: No complications from the medications presently being used. Last fasting lipid panel showed : 08/2012:  Fasting lipid panel normal.    PAST MEDICAL HISTORY : Reviewed.  No changes.  CURRENT MEDICATIONS: Reviewed per [facility]  REVIEW OF SYSTEMS:  Difficult to obtain due to dementia.   PHYSICAL EXAMINATION  VS:  T 97       P  72    RR 20      BP 134/70     POX 99% room air      WT (Lb)  GENERAL: no acute distress, normal body habitus EYES: conjunctivae normal, sclerae normal, normal eye lids NECK: supple, trachea midline, no neck masses, no thyroid tenderness, no thyromegaly LYMPHATICS: no LAN in the neck, no supraclavicular LAN RESPIRATORY: breathing is even & unlabored, BS CTAB CARDIAC: RRR, no murmur,no extra heart sounds EDEMA/VARICOSITIES:  +1 bilateral lower extremity edema  ARTERIAL:  pedal pulses +1  GI: abdomen soft, normal BS, no masses, no tenderness, no hepatomegaly, no splenomegaly PSYCHIATRIC: the patient is alert & oriented to person, affect & behavior appropriate  LABS/RADIOLOGY: 08/2012:  Creatinine 1.24, otherwise BMP normal,tegretol level 5.9, depakote level 30.8  Albumin 3.4,  otherwise CMP normal.     Hemoglobin 12.5, MCV 92.4, otherwise CBC normal.    05/2012:  Iron panel normal.  TSH 3.954.  Vitamin B12 level 385, folate 8.6, ferritin 136.  ASSESSMENT/PLAN:  Diabetes mellitus.  Well controlled, but Lantus had to be decreased due to hypoglycemia.   Hypertension.  Stable.  Hyperlipidemia.  Well controlled.    Alzheimer's dementia.  Moderate advanced.    Seizure disorder.  Well controlled.    Urinary incontinence.  Continue Detrol LA.   Gout.  Continue allopurinol.    Depression.  Continue current antidepressant.   CPT CODE: 24401

## 2012-10-21 ENCOUNTER — Non-Acute Institutional Stay (SKILLED_NURSING_FACILITY): Payer: Medicare Other | Admitting: Adult Health

## 2012-10-21 DIAGNOSIS — E7800 Pure hypercholesterolemia, unspecified: Secondary | ICD-10-CM

## 2012-10-21 DIAGNOSIS — M109 Gout, unspecified: Secondary | ICD-10-CM

## 2012-10-21 DIAGNOSIS — F329 Major depressive disorder, single episode, unspecified: Secondary | ICD-10-CM

## 2012-10-21 DIAGNOSIS — E119 Type 2 diabetes mellitus without complications: Secondary | ICD-10-CM

## 2012-10-21 DIAGNOSIS — R32 Unspecified urinary incontinence: Secondary | ICD-10-CM

## 2012-10-21 DIAGNOSIS — R569 Unspecified convulsions: Secondary | ICD-10-CM

## 2012-10-21 DIAGNOSIS — F0390 Unspecified dementia without behavioral disturbance: Secondary | ICD-10-CM

## 2012-10-21 DIAGNOSIS — I10 Essential (primary) hypertension: Secondary | ICD-10-CM

## 2012-11-22 ENCOUNTER — Observation Stay (HOSPITAL_COMMUNITY)
Admission: EM | Admit: 2012-11-22 | Discharge: 2012-11-25 | Disposition: A | Discharge status: Skilled Nursing Facility | Payer: Medicare Other | Attending: Internal Medicine | Admitting: Internal Medicine

## 2012-11-22 ENCOUNTER — Emergency Department (HOSPITAL_COMMUNITY): Payer: Medicare Other

## 2012-11-22 ENCOUNTER — Encounter (HOSPITAL_COMMUNITY): Payer: Self-pay | Admitting: Emergency Medicine

## 2012-11-22 ENCOUNTER — Non-Acute Institutional Stay (SKILLED_NURSING_FACILITY): Payer: Medicare Other | Admitting: Internal Medicine

## 2012-11-22 DIAGNOSIS — I1310 Hypertensive heart and chronic kidney disease without heart failure, with stage 1 through stage 4 chronic kidney disease, or unspecified chronic kidney disease: Secondary | ICD-10-CM | POA: Insufficient documentation

## 2012-11-22 DIAGNOSIS — N179 Acute kidney failure, unspecified: Secondary | ICD-10-CM

## 2012-11-22 DIAGNOSIS — R001 Bradycardia, unspecified: Secondary | ICD-10-CM

## 2012-11-22 DIAGNOSIS — Z79899 Other long term (current) drug therapy: Secondary | ICD-10-CM | POA: Insufficient documentation

## 2012-11-22 DIAGNOSIS — N184 Chronic kidney disease, stage 4 (severe): Secondary | ICD-10-CM

## 2012-11-22 DIAGNOSIS — N182 Chronic kidney disease, stage 2 (mild): Secondary | ICD-10-CM

## 2012-11-22 DIAGNOSIS — R55 Syncope and collapse: Principal | ICD-10-CM

## 2012-11-22 DIAGNOSIS — I219 Acute myocardial infarction, unspecified: Secondary | ICD-10-CM

## 2012-11-22 DIAGNOSIS — R4182 Altered mental status, unspecified: Secondary | ICD-10-CM

## 2012-11-22 DIAGNOSIS — I498 Other specified cardiac arrhythmias: Secondary | ICD-10-CM

## 2012-11-22 DIAGNOSIS — I119 Hypertensive heart disease without heart failure: Secondary | ICD-10-CM

## 2012-11-22 DIAGNOSIS — R079 Chest pain, unspecified: Secondary | ICD-10-CM

## 2012-11-22 DIAGNOSIS — F0390 Unspecified dementia without behavioral disturbance: Secondary | ICD-10-CM

## 2012-11-22 DIAGNOSIS — E119 Type 2 diabetes mellitus without complications: Secondary | ICD-10-CM | POA: Insufficient documentation

## 2012-11-22 DIAGNOSIS — I10 Essential (primary) hypertension: Secondary | ICD-10-CM

## 2012-11-22 DIAGNOSIS — E7800 Pure hypercholesterolemia, unspecified: Secondary | ICD-10-CM | POA: Insufficient documentation

## 2012-11-22 LAB — GLUCOSE, CAPILLARY
Glucose-Capillary: 100 mg/dL — ABNORMAL HIGH (ref 70–99)
Glucose-Capillary: 180 mg/dL — ABNORMAL HIGH (ref 70–99)

## 2012-11-22 LAB — CBC WITH DIFFERENTIAL/PLATELET
Basophils Absolute: 0 10*3/uL (ref 0.0–0.1)
Eosinophils Absolute: 0.4 10*3/uL (ref 0.0–0.7)
Eosinophils Relative: 6 % — ABNORMAL HIGH (ref 0–5)
HCT: 40.4 % (ref 39.0–52.0)
Lymphocytes Relative: 37 % (ref 12–46)
MCH: 31.8 pg (ref 26.0–34.0)
MCHC: 33.4 g/dL (ref 30.0–36.0)
MCV: 95.1 fL (ref 78.0–100.0)
Monocytes Absolute: 0.7 10*3/uL (ref 0.1–1.0)
Platelets: 233 10*3/uL (ref 150–400)
RDW: 14.2 % (ref 11.5–15.5)

## 2012-11-22 LAB — COMPREHENSIVE METABOLIC PANEL
ALT: 17 U/L (ref 0–53)
AST: 18 U/L (ref 0–37)
CO2: 32 mEq/L (ref 19–32)
Calcium: 9 mg/dL (ref 8.4–10.5)
Creatinine, Ser: 1.47 mg/dL — ABNORMAL HIGH (ref 0.50–1.35)
GFR calc Af Amer: 53 mL/min — ABNORMAL LOW (ref >90)
GFR calc non Af Amer: 46 mL/min — ABNORMAL LOW (ref >90)
Sodium: 143 mEq/L (ref 135–145)
Total Protein: 7.8 g/dL (ref 6.0–8.3)

## 2012-11-22 LAB — POCT I-STAT TROPONIN I

## 2012-11-22 LAB — CK TOTAL AND CKMB (NOT AT ARMC)
CK, MB: 3.9 ng/mL (ref 0.3–4.0)
Relative Index: 2.3 (ref 0.0–2.5)
Total CK: 173 U/L (ref 7–232)

## 2012-11-22 LAB — CBC
HCT: 39.6 % (ref 39.0–52.0)
MCHC: 34.3 g/dL (ref 30.0–36.0)
MCV: 95.4 fL (ref 78.0–100.0)
Platelets: 237 10*3/uL (ref 150–400)
RDW: 14.1 % (ref 11.5–15.5)

## 2012-11-22 MED ORDER — FESOTERODINE FUMARATE ER 8 MG PO TB24
8.0000 mg | ORAL_TABLET | Freq: Every day | ORAL | Status: DC
  Administered 2012-11-22 – 2012-11-25 (×4): 8 mg via ORAL
  Filled 2012-11-22 (×4): qty 1

## 2012-11-22 MED ORDER — DONEPEZIL HCL 10 MG PO TABS
10.0000 mg | ORAL_TABLET | Freq: Every day | ORAL | Status: DC
  Administered 2012-11-22 – 2012-11-24 (×3): 10 mg via ORAL
  Filled 2012-11-22 (×4): qty 1

## 2012-11-22 MED ORDER — VITAMIN C 500 MG PO TABS
500.0000 mg | ORAL_TABLET | Freq: Every day | ORAL | Status: DC
  Administered 2012-11-22 – 2012-11-25 (×4): 500 mg via ORAL
  Filled 2012-11-22 (×4): qty 1

## 2012-11-22 MED ORDER — ACETAMINOPHEN 325 MG PO TABS: 650.0000 mg | ORAL_TABLET | Freq: Four times a day (QID) | ORAL | Status: DC | PRN

## 2012-11-22 MED ORDER — INSULIN GLARGINE 100 UNIT/ML ~~LOC~~ SOLN
14.0000 [IU] | Freq: Every day | SUBCUTANEOUS | Status: DC
  Administered 2012-11-22 – 2012-11-24 (×3): 14 [IU] via SUBCUTANEOUS
  Filled 2012-11-22 (×4): qty 0.14

## 2012-11-22 MED ORDER — INSULIN ASPART 100 UNIT/ML ~~LOC~~ SOLN
0.0000 [IU] | Freq: Three times a day (TID) | SUBCUTANEOUS | Status: DC
  Administered 2012-11-25: 1 [IU] via SUBCUTANEOUS

## 2012-11-22 MED ORDER — HYDRALAZINE HCL 50 MG PO TABS
100.0000 mg | ORAL_TABLET | Freq: Three times a day (TID) | ORAL | Status: DC
  Administered 2012-11-22 – 2012-11-25 (×8): 100 mg via ORAL
  Filled 2012-11-22 (×11): qty 2

## 2012-11-22 MED ORDER — CLONIDINE HCL 0.3 MG PO TABS
0.3000 mg | ORAL_TABLET | Freq: Three times a day (TID) | ORAL | Status: DC
  Administered 2012-11-22 – 2012-11-25 (×9): 0.3 mg via ORAL
  Filled 2012-11-22 (×11): qty 1

## 2012-11-22 MED ORDER — ACETAMINOPHEN 650 MG RE SUPP: 650.0000 mg | Freq: Four times a day (QID) | RECTAL | Status: DC | PRN

## 2012-11-22 MED ORDER — AMLODIPINE BESYLATE 10 MG PO TABS
10.0000 mg | ORAL_TABLET | Freq: Every evening | ORAL | Status: DC
  Administered 2012-11-22 – 2012-11-24 (×3): 10 mg via ORAL
  Filled 2012-11-22 (×4): qty 1

## 2012-11-22 MED ORDER — MEMANTINE HCL ER 28 MG PO CP24
28.0000 mg | ORAL_CAPSULE | Freq: Every day | ORAL | Status: DC
Start: 2012-11-22 — End: 2012-11-22

## 2012-11-22 MED ORDER — ONDANSETRON HCL 4 MG PO TABS: 4.0000 mg | ORAL_TABLET | Freq: Four times a day (QID) | ORAL | Status: DC | PRN

## 2012-11-22 MED ORDER — CARBAMAZEPINE 100 MG PO CHEW
100.0000 mg | CHEWABLE_TABLET | Freq: Every day | ORAL | Status: DC
  Administered 2012-11-23 – 2012-11-25 (×3): 100 mg via ORAL
  Filled 2012-11-22 (×4): qty 1

## 2012-11-22 MED ORDER — ENOXAPARIN SODIUM 30 MG/0.3ML ~~LOC~~ SOLN
30.0000 mg | SUBCUTANEOUS | Status: DC
  Administered 2012-11-22: 30 mg via SUBCUTANEOUS
  Filled 2012-11-22 (×2): qty 0.3

## 2012-11-22 MED ORDER — ATORVASTATIN CALCIUM 20 MG PO TABS
20.0000 mg | ORAL_TABLET | Freq: Every day | ORAL | Status: DC
  Administered 2012-11-22 – 2012-11-24 (×3): 20 mg via ORAL
  Filled 2012-11-22 (×4): qty 1

## 2012-11-22 MED ORDER — DIVALPROEX SODIUM 500 MG PO DR TAB
500.0000 mg | DELAYED_RELEASE_TABLET | Freq: Three times a day (TID) | ORAL | Status: DC
  Administered 2012-11-22 – 2012-11-25 (×9): 500 mg via ORAL
  Filled 2012-11-22 (×11): qty 1

## 2012-11-22 MED ORDER — ALBUTEROL SULFATE (5 MG/ML) 0.5% IN NEBU
2.5000 mg | INHALATION_SOLUTION | Freq: Four times a day (QID) | RESPIRATORY_TRACT | Status: DC | PRN
  Administered 2012-11-23 – 2012-11-24 (×2): 2.5 mg via RESPIRATORY_TRACT
  Filled 2012-11-22 (×2): qty 0.5

## 2012-11-22 MED ORDER — ONDANSETRON HCL 4 MG/2ML IJ SOLN: 4.0000 mg | Freq: Four times a day (QID) | INTRAMUSCULAR | Status: DC | PRN

## 2012-11-22 MED ORDER — ATENOLOL 100 MG PO TABS
100.0000 mg | ORAL_TABLET | Freq: Every day | ORAL | Status: DC
  Administered 2012-11-22 – 2012-11-25 (×4): 100 mg via ORAL
  Filled 2012-11-22 (×5): qty 1

## 2012-11-22 MED ORDER — IPRATROPIUM BROMIDE 0.02 % IN SOLN
0.5000 mg | Freq: Four times a day (QID) | RESPIRATORY_TRACT | Status: DC
  Filled 2012-11-22: qty 2.5

## 2012-11-22 MED ORDER — SODIUM CHLORIDE 0.9 % IV SOLN
INTRAVENOUS | Status: DC
  Administered 2012-11-22 – 2012-11-23 (×2): via INTRAVENOUS

## 2012-11-22 MED ORDER — ONDANSETRON HCL 4 MG/2ML IJ SOLN: 4.0000 mg | Freq: Three times a day (TID) | INTRAMUSCULAR | Status: AC | PRN

## 2012-11-22 MED ORDER — INSULIN ASPART 100 UNIT/ML ~~LOC~~ SOLN: 0.0000 [IU] | Freq: Every day | SUBCUTANEOUS | Status: DC

## 2012-11-22 MED ORDER — BETAXOLOL HCL 0.25 % OP SUSP
1.0000 [drp] | Freq: Two times a day (BID) | OPHTHALMIC | Status: DC
  Administered 2012-11-22 – 2012-11-25 (×6): 1 [drp] via OPHTHALMIC
  Filled 2012-11-22 (×2): qty 10

## 2012-11-22 MED ORDER — TRAVOPROST (BAK FREE) 0.004 % OP SOLN
1.0000 [drp] | Freq: Every day | OPHTHALMIC | Status: DC
  Administered 2012-11-22 – 2012-11-24 (×3): 1 [drp] via OPHTHALMIC
  Filled 2012-11-22 (×2): qty 2.5

## 2012-11-22 MED ORDER — MEMANTINE HCL 10 MG PO TABS
10.0000 mg | ORAL_TABLET | Freq: Two times a day (BID) | ORAL | Status: DC
  Administered 2012-11-22 – 2012-11-25 (×6): 10 mg via ORAL
  Filled 2012-11-22 (×7): qty 1

## 2012-11-22 MED ORDER — SODIUM CHLORIDE 0.9 % IJ SOLN
3.0000 mL | Freq: Two times a day (BID) | INTRAMUSCULAR | Status: DC
  Administered 2012-11-23 – 2012-11-25 (×4): 3 mL via INTRAVENOUS

## 2012-11-22 MED ORDER — ALUM & MAG HYDROXIDE-SIMETH 200-200-20 MG/5ML PO SUSP: 30.0000 mL | Freq: Four times a day (QID) | ORAL | Status: DC | PRN

## 2012-11-22 MED ORDER — ASPIRIN 81 MG PO CHEW
324.0000 mg | CHEWABLE_TABLET | Freq: Once | ORAL | Status: AC
  Administered 2012-11-22: 324 mg via ORAL

## 2012-11-22 MED ORDER — CARBAMAZEPINE 100 MG PO CHEW
200.0000 mg | CHEWABLE_TABLET | Freq: Every evening | ORAL | Status: DC
  Administered 2012-11-22 – 2012-11-24 (×3): 200 mg via ORAL
  Filled 2012-11-22 (×4): qty 2

## 2012-11-22 MED ORDER — FLUOXETINE HCL 20 MG PO TABS
20.0000 mg | ORAL_TABLET | Freq: Every day | ORAL | Status: DC
  Administered 2012-11-22 – 2012-11-25 (×4): 20 mg via ORAL
  Filled 2012-11-22 (×4): qty 1

## 2012-11-22 NOTE — Progress Notes (Signed)
Unable to take orthostatic vital signs, pt unable to sit and stand. Benedetto Coons NP made aware.

## 2012-11-22 NOTE — Progress Notes (Signed)
PROGRESS NOTE  DATE: 11/22/2012  FACILITY:  [facility]  LEVEL OF CARE: SNF (31)  Acute Visit  CHIEF COMPLAINT:  Manage myocardial infarction  HISTORY OF PRESENT ILLNESS: I was requested by the staff to assess the patient regarding above problem(s):  Pt had a syncopal episode a couple of days ago.  In the work up an EKG was done which shows possible myocardial ischemia in lateral leads.  His pulses are 55, 56.  BPs 178/99 & 172/99.  He denies chest pain, sob, palpitations or diaphoresis.  PAST MEDICAL HISTORY : Reviewed.  No changes.  CURRENT MEDICATIONS: Reviewed per [facility]  REVIEW OF SYSTEMS: difficult to obtain due to dementia  PHYSICAL EXAMINATION  GENERAL: no acute distress, normal body habitus SKIN: warm, dry, no ulcers EYES: conjunctivae normal, sclerae normal, normal eye lids NECK: supple, trachea midline, no neck masses, no thyroid tenderness, no thyromegaly LYMPHATICS: no LAN in the neck, no supraclavicular LAN RESPIRATORY: breathing is even & unlabored, BS CTAB CARDIAC: bradycardia, no murmur,no extra heart sounds, no edema GI: abdomen soft, normal BS, no masses, no tenderness, no hepatomegaly, no splenomegaly PSYCHIATRIC: the patient is alert & oriented to person, affect & behavior appropriate NEUROLOGICAL: difficult to perform a full neuro exam since pt is not following commands.  LABS/RADIOLOGY:  11/20/12 Hb 12.5, mcv 93.6 ow cbc nl, glc 156, cr 1.61 ow bmp nl, tegretol level 5.2, EKG as in HPI  ASSESSMENT/PLAN:  Probable MI Bradycardia Syncope Uncontrolled HTN  New onset significant problems.  Pt is unstable.  Will send to ER for immediate evaluation & further investigation.  He will need CXR, cbc w diff, cmp, serial cardiac enzymes, serial EKG's, telemetry & cardiac consult.  Total pt care time > 40 min, discussed case with NP.  CPT CODE: 86578

## 2012-11-22 NOTE — H&P (Signed)
History and Physical       Hospital Admission Note Date: 11/22/2012  Patient name: [name] Medical record number: [id number] Date of birth: 17-Oct-1940 Age: 72 y.o. Gender: male PCP: GREEN, [name], MD    Chief Complaint:  Patient sent from skilled nursing facility for syncopal episode and chest pain  HPI: Patient is a 72 year old male with history of advanced dementia, CHF, hypertension, hyperlipidemia, CVA, diabetes was sent from skilled nursing facility today for syncopal episode 2 days ago and chest pain. At the time of my encounter patient is pleasantly confused and does not know why he is in the hospital. He denies any chest pain to me. He does not remember that he had a syncopal episode. Point of care troponin was 0.03. Creatinine is elevated at 1.47. EKG showed rate of 64, T wave inversions in V5 and V6, hospitalist team requested for admission for CP rule out and syncope workup  Review of Systems:  Unable to obtain from the patient due to advanced dementia  Past Medical History: Past Medical History  Diagnosis Date  . Colon cancer   . Dementia   . CHF (congestive heart failure)   . Hypertension   . Hyperlipemia   . Diabetes mellitus   . History of CVA (cerebrovascular accident)   . History of colectomy   . Mild mental retardation    Past Surgical History  Procedure Laterality Date  . Colectomy  08/2008    Medications: Prior to Admission medications   Medication Sig Start Date End Date Taking? Authorizing Provider  albuterol (PROVENTIL) (2.5 MG/3ML) 0.083% nebulizer solution Take 2.5 mg by nebulization every 6 (six) hours as needed for wheezing.   Yes Historical Provider, MD  amLODipine (NORVASC) 10 MG tablet Take 10 mg by mouth every evening. Hold for SBP<110   Yes Historical Provider, MD  atenolol (TENORMIN) 100 MG tablet Take 100 mg by mouth daily. Hold for SBP less than 110 and pulse less than 50    Yes Historical Provider, MD  atorvastatin (LIPITOR) 20 MG tablet Take 20 mg by mouth at bedtime.   Yes Historical Provider, MD  betaxolol (BETOPTIC-S) 0.25 % ophthalmic suspension Place 1 drop into both eyes 2 (two) times daily.     Yes Historical Provider, MD  carbamazepine (TEGRETOL) 100 MG chewable tablet Chew 200 mg by mouth every evening. Takes 100 mg in the am and 200 mg every evening   Yes Historical Provider, MD  cloNIDine (CATAPRES) 0.3 MG tablet Take 0.3 mg by mouth 3 (three) times daily.   Yes Historical Provider, MD  Cranberry 475 MG CAPS Take 475 mg by mouth 2 (two) times daily.   Yes Historical Provider, MD  divalproex (DEPAKOTE) 500 MG DR tablet Take 500 mg by mouth 3 (three) times daily.   Yes Historical Provider, MD  donepezil (ARICEPT) 10 MG tablet Take 10 mg by mouth at bedtime.    Yes Historical Provider, MD  FLUoxetine (PROZAC) 20 MG tablet Take 20 mg by mouth daily.     Yes Historical Provider, MD  hydrALAZINE (APRESOLINE) 100 MG tablet Take 100 mg by mouth every 8 (eight) hours.   Yes Historical Provider, MD  insulin aspart (NOVOLOG) 100 UNIT/ML injection Inject into the skin 3 (three) times daily before meals. Sliding scale 70-120 no insulin 121-150=3 units 151-200=4 units 201-250=7 units 251-300=11 units 351-400=20 units   Yes Historical Provider, MD  insulin glargine (LANTUS) 100 UNIT/ML injection Inject 14 Units into the skin at bedtime.  Yes Lesle Chris Black, NP  ipratropium (ATROVENT) 0.02 % nebulizer solution Take 2.5 mLs (0.5 mg total) by nebulization every 6 (six) hours. 07/11/12  Yes Marianne L York, PA-C  memantine (NAMENDA) 10 MG tablet Take 10 mg by mouth 2 (two) times daily.     Yes Historical Provider, MD  Memantine HCl ER (NAMENDA XR) 28 MG CP24 Take 28 mg by mouth daily.   Yes Historical Provider, MD  tolterodine (DETROL LA) 4 MG 24 hr capsule Take 4 mg by mouth daily.   Yes Historical Provider, MD  travoprost, benzalkonium, (TRAVATAN) 0.004 % ophthalmic solution  Place 1 drop into both eyes at bedtime.     Yes Historical Provider, MD  vitamin C (ASCORBIC ACID) 500 MG tablet Take 500 mg by mouth daily.    Yes Historical Provider, MD    Allergies:  No Known Allergies  Social History:  reports that he quit smoking about 21 years ago. He has never used smokeless tobacco. He reports that he does not drink alcohol or use illicit drugs.  Family History: Family History  Problem Relation Age of Onset  . Diabetes Mother   . Diabetes Mother   . Diabetes Sister   . Diabetes Mother   . Diabetes Father   . Hypertension Sister   . Hypertension Mother   . Hypertension Father   . Hypertension Brother     Physical Exam: Blood pressure 144/95, pulse 63, temperature 98.7 F (37.1 C), temperature source Oral, resp. rate 18, SpO2 98.00%. General: Alert, awake, pleasantly confused no acute distress. Denies any chest pain HEENT: normocephalic, atraumatic, anicteric sclera, pink conjunctiva, pupils equal and reactive to light and accomodation, oropharynx clear Neck: supple, no masses or lymphadenopathy, no goiter, no bruits  Heart: Regular rate and rhythm, without murmurs, rubs or gallops. Lungs: Clear to auscultation bilaterally, no wheezing, rales or rhonchi. Abdomen: Soft, nontender, nondistended, positive bowel sounds, no masses. Extremities: No clubbing, cyanosis or edema with positive pedal pulses. Neuro: Grossly intact, no focal neurological deficits, strength 5/5 upper and lower extremities bilaterally Psych: Dementia, confused  Skin: no rashes or lesions, warm and dry   LABS on Admission:  Basic Metabolic Panel:  Recent Labs Lab 11/22/12 1229  NA 143  K 3.7  CL 103  CO2 32  GLUCOSE 87  BUN 19  CREATININE 1.47*  CALCIUM 9.0   Liver Function Tests:  Recent Labs Lab 11/22/12 1229  AST 18  ALT 17  ALKPHOS 79  BILITOT 0.2*  PROT 7.8  ALBUMIN 3.0*   No results found for this basename: LIPASE, AMYLASE,  in the last 168 hours No  results found for this basename: AMMONIA,  in the last 168 hours CBC:  Recent Labs Lab 11/22/12 1229  WBC 6.5  NEUTROABS 3.1  HGB 13.5  HCT 40.4  MCV 95.1  PLT 233   Cardiac Enzymes: No results found for this basename: CKTOTAL, CKMB, CKMBINDEX, TROPONINI,  in the last 168 hours BNP: No components found with this basename: POCBNP,  CBG: No results found for this basename: GLUCAP,  in the last 168 hours   Radiological Exams on Admission: Dg Chest 2 View  11/22/2012   *RADIOLOGY REPORT*  Clinical Data: Abnormal EKG, short of breath  CHEST - 2 VIEW  Comparison: Prior chest x-ray 07/08/2012  Findings: Very low inspiratory volumes.  Given the lower lung volumes, the degree of enlargement the cardiopericardial silhouette is essentially unchanged.  There is bibasilar atelectasis. Pulmonary vascular congestion appears exaggerated due to  the low lung volumes.  No overt edema.  No pleural effusion or pneumothorax.  No acute osseous abnormality.  IMPRESSION:  1.  Very low inspiratory volumes with bibasilar atelectasis. 2.  Stable cardiomegaly and pulmonary vascular congestion without overt edema.   Original Report Authenticated By: Malachy Moan, M.D.    Assessment/Plan Principal Problem:   Syncope with ?chest pain: 2 days ago at the rehab/ SNF facility, unclear of the cause. Patient is unable to provide any history. Given he has complaint of chest pain at the facility with abnormal EKG (T wave inversion and lateral leads, although EKG is poor quality) compared with previous EKG in March 2014.  - Will admit under observation, obtain orthostatic vitals, serial cardiac enzymes -  obtain 2-D echo for further workup, continue gentle hydration, obtain UA to rule out any UTI - PT evaluation   Active Problems:   Dementia: patient appears to have advanced dementia, continue Namenda, Aricept, Prozac, Depakote     Hypertension: Currently stable, continue outpatient antihypertensives     Diabetes  mellitus -Placed on Lantus, sliding scale insulin      CKD (chronic kidney disease) stage 4, GFR 15-29 ml/min -Place on gentle hydration, last creatinine in March 2014 was 1.1      Pure hypercholesterolemia Continue Lipitor   DVT prophylaxis: LOVENOX   CODE STATUS:  full CODE STATUS as per the transfer records   Further plan will depend as patient's clinical course evolves and further radiologic and laboratory data become available.   Time Spent on Admission: 1 hour  Lynton Crescenzo M.D. [facility] 11/22/2012, 3:00 PM Pager: [phone]  If 7PM-7AM, please contact night-coverage [email or web address] Password [email or web address]

## 2012-11-22 NOTE — ED Provider Notes (Signed)
History    CSN: [id number] Arrival date & time 11/22/12  1116  First MD Initiated Contact with Patient 11/22/12 1152     Chief Complaint  Patient presents with  . Abnormal ECG   (Consider location/radiation/quality/duration/timing/severity/associated sxs/prior Treatment) HPI  Level 5 due to dementia and mr  72 y.o. Male with multiple medical problems who is brought in today from rehab facility with report that he has had chest pain and a syncopal episode two days ago.  Patient is unable to give any history.  He denies any current pain.  Past Medical History  Diagnosis Date  . Colon cancer   . Dementia   . CHF (congestive heart failure)   . Hypertension   . Hyperlipemia   . Diabetes mellitus   . History of CVA (cerebrovascular accident)   . History of colectomy   . Mild mental retardation    Past Surgical History  Procedure Laterality Date  . Colectomy  08/2008   Family History  Problem Relation Age of Onset  . Diabetes Mother   . Diabetes Mother   . Diabetes Sister   . Diabetes Mother   . Diabetes Father   . Hypertension Sister   . Hypertension Mother   . Hypertension Father   . Hypertension Brother    History  Substance Use Topics  . Smoking status: Former Smoker -- 2.00 packs/day    Quit date: 07/29/1991  . Smokeless tobacco: Never Used  . Alcohol Use: No    Review of Systems  All other systems reviewed and are negative.    Allergies  Review of patient's allergies indicates no known allergies.  Home Medications   Current Outpatient Rx  Name  Route  Sig  Dispense  Refill  . albuterol (PROVENTIL) (2.5 MG/3ML) 0.083% nebulizer solution   Nebulization   Take 2.5 mg by nebulization every 6 (six) hours as needed for wheezing.         Marland Kitchen amLODipine (NORVASC) 10 MG tablet   Oral   Take 10 mg by mouth every evening. Hold for SBP<110         . atenolol (TENORMIN) 100 MG tablet   Oral   Take 100 mg by mouth daily. Hold for SBP less than 110 and  pulse less than 50         . atorvastatin (LIPITOR) 20 MG tablet   Oral   Take 20 mg by mouth at bedtime.         . betaxolol (BETOPTIC-S) 0.25 % ophthalmic suspension   Both Eyes   Place 1 drop into both eyes 2 (two) times daily.           . carbamazepine (TEGRETOL) 100 MG chewable tablet   Oral   Chew 200 mg by mouth every evening. Takes 100 mg in the am and 200 mg every evening         . cloNIDine (CATAPRES) 0.3 MG tablet   Oral   Take 0.3 mg by mouth 3 (three) times daily.         . Cranberry 475 MG CAPS   Oral   Take 475 mg by mouth 2 (two) times daily.         . divalproex (DEPAKOTE) 500 MG DR tablet   Oral   Take 500 mg by mouth 3 (three) times daily.         Marland Kitchen donepezil (ARICEPT) 10 MG tablet   Oral   Take 10 mg by mouth at bedtime.          Marland Kitchen  FLUoxetine (PROZAC) 20 MG tablet   Oral   Take 20 mg by mouth daily.           . hydrALAZINE (APRESOLINE) 100 MG tablet   Oral   Take 100 mg by mouth every 8 (eight) hours.         . insulin aspart (NOVOLOG) 100 UNIT/ML injection   Subcutaneous   Inject into the skin 3 (three) times daily before meals. Sliding scale 70-120 no insulin 121-150=3 units 151-200=4 units 201-250=7 units 251-300=11 units 351-400=20 units         . insulin glargine (LANTUS) 100 UNIT/ML injection   Subcutaneous   Inject 14 Units into the skin at bedtime.          Marland Kitchen ipratropium (ATROVENT) 0.02 % nebulizer solution   Nebulization   Take 2.5 mLs (0.5 mg total) by nebulization every 6 (six) hours.   75 mL   0   . memantine (NAMENDA) 10 MG tablet   Oral   Take 10 mg by mouth 2 (two) times daily.           . Memantine HCl ER (NAMENDA XR) 28 MG CP24   Oral   Take 28 mg by mouth daily.         Marland Kitchen tolterodine (DETROL LA) 4 MG 24 hr capsule   Oral   Take 4 mg by mouth daily.         . travoprost, benzalkonium, (TRAVATAN) 0.004 % ophthalmic solution   Both Eyes   Place 1 drop into both eyes at bedtime.            . vitamin C (ASCORBIC ACID) 500 MG tablet   Oral   Take 500 mg by mouth daily.           BP 163/94  Pulse 62  Temp(Src) 98.7 F (37.1 C) (Oral)  Resp 18  SpO2 97% Physical Exam  Nursing note and vitals reviewed. Constitutional: He appears well-developed and well-nourished.  HENT:  Head: Normocephalic and atraumatic.  Right Ear: External ear normal.  Left Ear: External ear normal.  Nose: Nose normal.  Mouth/Throat: Oropharynx is clear and moist.  Eyes: Conjunctivae and EOM are normal. Pupils are equal, round, and reactive to light.  Neck: Normal range of motion. Neck supple.  Cardiovascular: Normal rate, regular rhythm, normal heart sounds and intact distal pulses.   Pulmonary/Chest: Effort normal and breath sounds normal.  Abdominal: Soft. Bowel sounds are normal.  Musculoskeletal: Normal range of motion.  Neurological: He is alert.  Patient with unintelligible speech, no definite lateralized weakness.  DTR equal bilateral knees, and elbows  Skin: Skin is warm and dry.    ED Course  Procedures (including critical care time) Labs Reviewed  CBC WITH DIFFERENTIAL - Abnormal; Notable for the following:    Eosinophils Relative 6 (*)    All other components within normal limits  COMPREHENSIVE METABOLIC PANEL - Abnormal; Notable for the following:    Creatinine, Ser 1.47 (*)    Albumin 3.0 (*)    Total Bilirubin 0.2 (*)    GFR calc non Af Amer 46 (*)    GFR calc Af Amer 53 (*)    All other components within normal limits  POCT I-STAT TROPONIN I   Dg Chest 2 View  11/22/2012   *RADIOLOGY REPORT*  Clinical Data: Abnormal EKG, short of breath  CHEST - 2 VIEW  Comparison: Prior chest x-Renuka Farfan 07/08/2012  Findings: Very low inspiratory volumes.  Given the lower  lung volumes, the degree of enlargement the cardiopericardial silhouette is essentially unchanged.  There is bibasilar atelectasis. Pulmonary vascular congestion appears exaggerated due to the low lung volumes.  No overt  edema.  No pleural effusion or pneumothorax.  No acute osseous abnormality.  IMPRESSION:  1.  Very low inspiratory volumes with bibasilar atelectasis. 2.  Stable cardiomegaly and pulmonary vascular congestion without overt edema.   Original Report Authenticated By: Malachy Moan, M.D.   No diagnosis found.  MDM   Date: 11/22/2012  Rate: 64  Rhythm: normal sinus rhythm  QRS Axis: normal  Intervals: normal  ST/T Wave abnormalities: ST depressions laterally  Conduction Disutrbances:none  Narrative Interpretation:   Old EKG Reviewed: changes noted, st changes laterally are new from first prior ekg of 07/12/12  Results for orders placed during the hospital encounter of 11/22/12  CBC WITH DIFFERENTIAL      Result Value Range   WBC 6.5  4.0 - 10.5 K/uL   RBC 4.25  4.22 - 5.81 MIL/uL   Hemoglobin 13.5  13.0 - 17.0 g/dL   HCT 09.8  11.9 - 14.7 %   MCV 95.1  78.0 - 100.0 fL   MCH 31.8  26.0 - 34.0 pg   MCHC 33.4  30.0 - 36.0 g/dL   RDW 82.9  56.2 - 13.0 %   Platelets 233  150 - 400 K/uL   Neutrophils Relative % 48  43 - 77 %   Neutro Abs 3.1  1.7 - 7.7 K/uL   Lymphocytes Relative 37  12 - 46 %   Lymphs Abs 2.4  0.7 - 4.0 K/uL   Monocytes Relative 10  3 - 12 %   Monocytes Absolute 0.7  0.1 - 1.0 K/uL   Eosinophils Relative 6 (*) 0 - 5 %   Eosinophils Absolute 0.4  0.0 - 0.7 K/uL   Basophils Relative 0  0 - 1 %   Basophils Absolute 0.0  0.0 - 0.1 K/uL  COMPREHENSIVE METABOLIC PANEL      Result Value Range   Sodium 143  135 - 145 mEq/L   Potassium 3.7  3.5 - 5.1 mEq/L   Chloride 103  96 - 112 mEq/L   CO2 32  19 - 32 mEq/L   Glucose, Bld 87  70 - 99 mg/dL   BUN 19  6 - 23 mg/dL   Creatinine, Ser 8.65 (*) 0.50 - 1.35 mg/dL   Calcium 9.0  8.4 - 78.4 mg/dL   Total Protein 7.8  6.0 - 8.3 g/dL   Albumin 3.0 (*) 3.5 - 5.2 g/dL   AST 18  0 - 37 U/L   ALT 17  0 - 53 U/L   Alkaline Phosphatase 79  39 - 117 U/L   Total Bilirubin 0.2 (*) 0.3 - 1.2 mg/dL   GFR calc non Af Amer 46 (*) >90  mL/min   GFR calc Af Amer 53 (*) >90 mL/min  POCT I-STAT TROPONIN I      Result Value Range   Troponin i, poc 0.03  0.00 - 0.08 ng/mL   Comment 3            [name] is a 72 y.o. Male with mr and dementia who presents today with report that he has had chest pain and a syncopal episode at his rehab facility.  He has an abnormal ekg which is changed from his first prior.  He does not acknowledge any pain here.  First troponin is negative.  The  patient will be admitted for observation for serial ekgs and cardiac enzymes and cardiology consultation.     Hilario Quarry, MD 11/22/12 [phone]

## 2012-11-22 NOTE — ED Notes (Signed)
Two RN attempted IV insertion x2. IV team paged.

## 2012-11-22 NOTE — ED Notes (Signed)
Per EMS pt here from [facility] where he had abnormal EKG yesterday. Staff stated pt having CP today. Pt denies with EMS. Staff also stated pt had syncopal episode Wednesday. Pt has hx of dementia. NSR on monitor with some unifocal PVCs.

## 2012-11-22 NOTE — ED Notes (Signed)
Verified with courtney RN, pt will go upstairs without peripheral IV due to IV team delay at this time.

## 2012-11-23 ENCOUNTER — Observation Stay (HOSPITAL_COMMUNITY): Payer: Medicare Other

## 2012-11-23 DIAGNOSIS — I10 Essential (primary) hypertension: Secondary | ICD-10-CM

## 2012-11-23 DIAGNOSIS — R4182 Altered mental status, unspecified: Secondary | ICD-10-CM

## 2012-11-23 DIAGNOSIS — N182 Chronic kidney disease, stage 2 (mild): Secondary | ICD-10-CM

## 2012-11-23 DIAGNOSIS — I119 Hypertensive heart disease without heart failure: Secondary | ICD-10-CM

## 2012-11-23 LAB — BASIC METABOLIC PANEL
CO2: 25 mEq/L (ref 19–32)
Chloride: 106 mEq/L (ref 96–112)
GFR calc non Af Amer: 47 mL/min — ABNORMAL LOW (ref >90)
Glucose, Bld: 83 mg/dL (ref 70–99)
Potassium: 3.7 mEq/L (ref 3.5–5.1)
Sodium: 141 mEq/L (ref 135–145)

## 2012-11-23 LAB — URINALYSIS, ROUTINE W REFLEX MICROSCOPIC
Glucose, UA: NEGATIVE mg/dL
Leukocytes, UA: NEGATIVE
Protein, ur: >300 mg/dL — AB
Specific Gravity, Urine: 1.017 (ref 1.005–1.030)
Urobilinogen, UA: 1 mg/dL (ref 0.0–1.0)

## 2012-11-23 LAB — GLUCOSE, CAPILLARY
Glucose-Capillary: 93 mg/dL (ref 70–99)
Glucose-Capillary: 144 mg/dL — ABNORMAL HIGH (ref 70–99)

## 2012-11-23 LAB — CK TOTAL AND CKMB (NOT AT ARMC): Total CK: 143 U/L (ref 7–232)

## 2012-11-23 LAB — TROPONIN I: Troponin I: <0.3 ng/mL (ref <0.30)

## 2012-11-23 LAB — CBC
MCHC: 33.9 g/dL (ref 30.0–36.0)
RDW: 14.4 % (ref 11.5–15.5)

## 2012-11-23 MED ORDER — IPRATROPIUM BROMIDE 0.02 % IN SOLN
0.5000 mg | Freq: Four times a day (QID) | RESPIRATORY_TRACT | Status: DC | PRN
  Administered 2012-11-24: 0.5 mg via RESPIRATORY_TRACT
  Filled 2012-11-23: qty 2.5

## 2012-11-23 MED ORDER — ENOXAPARIN SODIUM 40 MG/0.4ML ~~LOC~~ SOLN
40.0000 mg | SUBCUTANEOUS | Status: DC
  Administered 2012-11-23 – 2012-11-24 (×2): 40 mg via SUBCUTANEOUS
  Filled 2012-11-23 (×3): qty 0.4

## 2012-11-23 MED ORDER — IPRATROPIUM BROMIDE 0.02 % IN SOLN
0.5000 mg | Freq: Four times a day (QID) | RESPIRATORY_TRACT | Status: DC
  Administered 2012-11-23 (×3): 0.5 mg via RESPIRATORY_TRACT
  Filled 2012-11-23 (×3): qty 2.5

## 2012-11-23 NOTE — Progress Notes (Signed)
Utilization review complete 

## 2012-11-23 NOTE — Progress Notes (Signed)
11/23/12  Pharmacy-Lovenox 1545  Cr 1.45 Ht  72"  Wt 90kg  A/P:  72yo male on Lovenox 30mg  SQ q24 for VTE px.  Estimated CrCl ~74ml/min, renal adjustment is not needed.  1.  Increase Lovenox to 40mg  SQ q24  Marisue Humble, PharmD Clinical Pharmacist [facility]

## 2012-11-23 NOTE — Progress Notes (Signed)
  Echocardiogram 2D Echocardiogram has been performed.  Georgian Co 11/23/2012, 5:27 PM

## 2012-11-23 NOTE — Progress Notes (Signed)
Unable to do admission history, patient has advanced dementia, no family members present.

## 2012-11-23 NOTE — Evaluation (Signed)
Physical Therapy Evaluation Patient Details Name: [name] MRN: [id number] DOB: [birth year] Today's Date: 11/23/2012 Time: 4098-1191 PT Time Calculation (min): 21 min  PT Assessment / Plan / Recommendation History of Present Illness  Patient is a 72 year old male with history of advanced dementia, CHF, hypertension, hyperlipidemia, CVA, diabetes was sent from skilled nursing facility today for syncopal episode 2 days ago and chest pain  Clinical Impression  Pt pleasantly confused gentleman from [facility]. Pt provided PLOF and unable to reach a nurse for PLOF at [facility]. Anticipate pt is probably at or very near baseline but without confirmation will follow acutely to maximize mobility and gait to increase strength and decrease burden of care as pt states he normally can walk much further. Will continue to attempt to reach facility to confirm PLOF.     PT Assessment  Patient needs continued PT services    Follow Up Recommendations  SNF    Does the patient have the potential to tolerate intense rehabilitation      Barriers to Discharge        Equipment Recommendations  None recommended by PT    Recommendations for Other Services     Frequency Min 2X/week    Precautions / Restrictions Precautions Precautions: Fall   Pertinent Vitals/Pain No pain      Mobility  Bed Mobility Bed Mobility: Supine to Sit Supine to Sit: 3: Mod assist;HOB elevated;With rails Details for Bed Mobility Assistance: mod assist to pivot lower body to EOB and cueing with assist to elevate trunk from surface Transfers Transfers: Sit to Stand;Stand to Sit Sit to Stand: 4: Min assist;From bed;From elevated surface Stand to Sit: 4: Min assist;To chair/3-in-1 Details for Transfer Assistance: max cueing for hand placement as pt trying to pull up on RW, pt would not follow commands to reach back for armrest to sit. assist for anterior translation and to control  descent Ambulation/Gait Ambulation/Gait Assistance: 4: Min assist Ambulation Distance (Feet): 20 Feet Assistive device: Rolling walker Ambulation/Gait Assistance Details: cueing for posture and position in RW as well as assist with turning RW Gait Pattern: Shuffle;Narrow base of support Gait velocity: decreased Stairs: No    Exercises     PT Diagnosis: Difficulty walking;Generalized weakness;Altered mental status  PT Problem List: Decreased activity tolerance;Decreased mobility;Decreased cognition PT Treatment Interventions: DME instruction;Gait training;Functional mobility training;Therapeutic activities;Therapeutic exercise;Patient/family education     PT Goals(Current goals can be found in the care plan section) Acute Rehab PT Goals Patient Stated Goal: pt unable to state agreeable to increase gait PT Goal Formulation: Patient unable to participate in goal setting Time For Goal Achievement: 11/30/12 Potential to Achieve Goals: Fair  Visit Information  Last PT Received On: 11/23/12 Assistance Needed: +1 History of Present Illness: Patient is a 72 year old male with history of advanced dementia, CHF, hypertension, hyperlipidemia, CVA, diabetes was sent from skilled nursing facility today for syncopal episode 2 days ago and chest pain       Prior Functioning  Home Living Family/patient expects to be discharged to:: Skilled nursing facility Prior Function Level of Independence: Needs assistance Comments: pt reports he walks a mile. Pt is total assist for all ADL, housework and bathing. Pt feeds himself and is incontinent of urine Communication Communication: Expressive difficulties;HOH    Cognition  Cognition Arousal/Alertness: Awake/alert Behavior During Therapy: Flat affect Overall Cognitive Status: History of cognitive impairments - at baseline  Pt currently disoriented to person, place, time and situation, able to follow one step commands with increased  time    Extremity/Trunk Assessment Upper Extremity Assessment Upper Extremity Assessment: Generalized weakness Lower Extremity Assessment Lower Extremity Assessment: Generalized weakness Cervical / Trunk Assessment Cervical / Trunk Assessment: Normal   Balance Balance Balance Assessed: Yes Static Sitting Balance Static Sitting - Balance Support: Bilateral upper extremity supported;Feet supported Static Sitting - Level of Assistance: 5: Stand by assistance Static Sitting - Comment/# of Minutes: 3  End of Session PT - End of Session Equipment Utilized During Treatment: Gait belt Activity Tolerance: Patient tolerated treatment well Patient left: in chair;with call bell/phone within reach Nurse Communication: Mobility status  GP     Delorse Lek 11/23/2012, 12:05 PM Delaney Meigs, PT [phone]

## 2012-11-23 NOTE — Progress Notes (Signed)
Pt up to recliner with PT, tolerated well, CT scan completed per MD orders, NAD noted, pt appears to be very sleepy today, but arousable, Alert to self. Will continue to monitor

## 2012-11-23 NOTE — Progress Notes (Signed)
[facility] PROGRESS NOTE  [name] EAV:[id number] DOB: [birth year] DOA: 11/22/2012 PCP: Kimber Relic, MD  Assessment/Plan: 1. Syncope; head CT showed no acute infarcts, as we secondary to patient BP being too low at the nursing home  2. Chest pain; troponin x3 negative. Cardiac echo pending  3. Dementia; pleasant, follows commands, unable to answer basic questions  4. HTN; stable no changes required at this month 5. CKD stable, patient's creatinine has been as low as 1.14 in March/2014 and as high as 1.64 in that same month.  Code Status:Full Family Communication: Spoke with wife concerning plan of care Disposition Plan: If cardiac echo negative discharge in the a.m.    Procedures:  Cardiac echo 07/09/2012 moderate concentric LVH,  LVEF= 55% to 60%. pseudonormal left ventricular filling pattern, with concomitant abnormal relaxation and increased filling pressure (grade 2 diastolic dysfunction). .  CT Head w/o Con 11/23/2012 1. No acute intracranial findings. No change from prior.  2. Remote left temporal and cerebellar infarctions.  3. Extensive atrophy and microvascular disease.        HPI/Subjective: 72 yo          PMHx CVA, colon cancer (S/P colectomy 08/2008), advanced dementia, CHF, hypertension, hyperlipidemia, CVA, DM. Was sent from [facility] skilled nursing facility today for syncopal episode 2 days ago and chest pain. At the time of my encounter patient is pleasantly confused and does not know why he is in the hospital. He denies any chest pain to me. He does not remember that he had a syncopal episode.  Point of care troponin was 0.03. Creatinine is elevated at 1.47. EKG showed rate of 64, T wave inversions in V5 and V6, hospitalist team requested admission for CP rule out and syncope workup. TODAY pleasant gentleman resting comfortably in his bed and eating his lunch. Obeys all commands but otherwise unable to interact appropriately.   Procedure Troponin  x3 negative Hemoglobin A1c= 6.2  Objective: Filed Vitals:   11/22/12 1635 11/22/12 2219 11/23/12 0707 11/23/12 0936  BP: 152/90 126/80 132/68   Pulse: 62 61 51   Temp: 98.2 F (36.8 C) 98.6 F (37 C) 97.3 F (36.3 C)   TempSrc: Oral Oral Oral   Resp: 20 20 18    Height: 6' (1.829 m)     Weight: 89.812 kg (198 lb)  90.2 kg (198 lb 13.7 oz)   SpO2: 96% 95% 98% 97%    Intake/Output Summary (Last 24 hours) at 11/23/12 1304 Last data filed at 11/23/12 1224  Gross per 24 hour  Intake 1216.25 ml  Output    600 ml  Net 616.25 ml   Filed Weights   11/22/12 1635 11/23/12 0707  Weight: 89.812 kg (198 lb) 90.2 kg (198 lb 13.7 oz)    Exam:   General:  Alert,NAD  Cardiovascular: Regular rhythm and rate  Respiratory: Clear to auscultation bilateral  Abdomen: Soft nontender nondistended plus bowel sounds   Data Reviewed: Basic Metabolic Panel:  Recent Labs Lab 11/22/12 1229 11/22/12 1848 11/23/12 0530  NA 143  --  141  K 3.7  --  3.7  CL 103  --  106  CO2 32  --  25  GLUCOSE 87  --  83  BUN 19  --  19  CREATININE 1.47* 1.33 1.45*  CALCIUM 9.0  --  8.1*   Liver Function Tests:  Recent Labs Lab 11/22/12 1229  AST 18  ALT 17  ALKPHOS 79  BILITOT 0.2*  PROT 7.8  ALBUMIN 3.0*   No results found for this basename: LIPASE, AMYLASE,  in the last 168 hours No results found for this basename: AMMONIA,  in the last 168 hours CBC:  Recent Labs Lab 11/22/12 1229 11/22/12 1848 11/23/12 0530  WBC 6.5 8.3 6.8  NEUTROABS 3.1  --   --   HGB 13.5 13.6 12.3*  HCT 40.4 39.6 36.3*  MCV 95.1 95.4 94.5  PLT 233 237 237   Cardiac Enzymes:  Recent Labs Lab 11/22/12 1849 11/22/12 2330 11/23/12 0530  CKTOTAL 173 143 124  CKMB 3.9 3.3 3.2  TROPONINI <0.30 <0.30 <0.30   BNP (last 3 results)  Recent Labs  07/08/12 1605  PROBNP 444.5*   CBG:  Recent Labs Lab 11/22/12 1714 11/22/12 2056 11/23/12 0614 11/23/12 1147  GLUCAP 100* 180* 78 93    Recent  Results (from the past 240 hour(s))  MRSA PCR SCREENING     Status: None   Collection Time    11/22/12  9:29 PM      Result Value Range Status   MRSA by PCR NEGATIVE  NEGATIVE Final   Comment:            The GeneXpert MRSA Assay (FDA     approved for NASAL specimens     only), is one component of a     comprehensive MRSA colonization     surveillance program. It is not     intended to diagnose MRSA     infection nor to guide or     monitor treatment for     MRSA infections.     Studies: Dg Chest 2 View  11/22/2012   *RADIOLOGY REPORT*  Clinical Data: Abnormal EKG, short of breath  CHEST - 2 VIEW  Comparison: Prior chest x-ray 07/08/2012  Findings: Very low inspiratory volumes.  Given the lower lung volumes, the degree of enlargement the cardiopericardial silhouette is essentially unchanged.  There is bibasilar atelectasis. Pulmonary vascular congestion appears exaggerated due to the low lung volumes.  No overt edema.  No pleural effusion or pneumothorax.  No acute osseous abnormality.  IMPRESSION:  1.  Very low inspiratory volumes with bibasilar atelectasis. 2.  Stable cardiomegaly and pulmonary vascular congestion without overt edema.   Original Report Authenticated By: Malachy Moan, M.D.    Scheduled Meds: . amLODipine  10 mg Oral QPM  . atenolol  100 mg Oral Daily  . atorvastatin  20 mg Oral QHS  . betaxolol  1 drop Both Eyes BID  . carbamazepine  100 mg Oral QAC breakfast  . carbamazepine  200 mg Oral QPM  . cloNIDine  0.3 mg Oral TID  . divalproex  500 mg Oral TID  . donepezil  10 mg Oral QHS  . enoxaparin (LOVENOX) injection  30 mg Subcutaneous Q24H  . fesoterodine  8 mg Oral Daily  . FLUoxetine  20 mg Oral Daily  . hydrALAZINE  100 mg Oral Q8H  . insulin aspart  0-5 Units Subcutaneous QHS  . insulin aspart  0-9 Units Subcutaneous TID WC  . insulin glargine  14 Units Subcutaneous QHS  . ipratropium  0.5 mg Nebulization Q6H  . memantine  10 mg Oral BID  . sodium  chloride  3 mL Intravenous Q12H  . Travoprost (BAK Free)  1 drop Both Eyes QHS  . vitamin C  500 mg Oral Daily   Continuous Infusions: . sodium chloride 75 mL/hr at 11/23/12 0946    Principal Problem:   Syncope Active  Problems:   Dementia   Hypertension   Diabetes mellitus   CKD (chronic kidney disease) stage 4, GFR 15-29 ml/min   Pure hypercholesterolemia   Chest pain    Time spent: 30 minutes    WOODS, CURTIS, J  [facility] Pager [phone]. If 7PM-7AM, please contact night-coverage at [email or web address], password [facility] 11/23/2012, 1:04 PM  LOS: 1 day

## 2012-11-24 LAB — GLUCOSE, CAPILLARY
Glucose-Capillary: 71 mg/dL (ref 70–99)
Glucose-Capillary: 99 mg/dL (ref 70–99)

## 2012-11-24 NOTE — Progress Notes (Signed)
CSW called [facility] re: pt's d/c.  Facility unable to accept pt back today b/c there is no one in admissions.  Unit CSW to f/u in the am and facilitate d/c.

## 2012-11-24 NOTE — Progress Notes (Signed)
Pt has not urinated throughout the am, Bladder scanner shows md notified. Ordered straight cath.

## 2012-11-24 NOTE — Discharge Summary (Addendum)
Physician Discharge Summary  [name] YNW:[id number] DOB: 10-18-1940 DOA: 11/22/2012  PCP: Kimber Relic, MD  Admit date: 11/22/2012 Discharge date: 11/24/2012  Time spent:30 minutes  Recommendations for Outpatient Follow-up:  1. Syncope; head CT showed no acute infarcts, most likely secondary to patient's BP being too low at the nursing home. We'll therefore allow patient's BP to run a little high. No orthostatic blood pressures recorded here  2. Chest pain; troponin x3 negative. Cardiac echo pending  3. Dementia; pleasant, follows commands, unable to answer basic questions  4. HTN; running high however given patient's physical condition could have been the cause for patient's the episode at the nursing facility will obtain orthostatics BP today and if patient not orthostatic will allow BP to run high.  5. CKD stable, patient's creatinine has been as low as 1.14 in March/2014 and as high as 1.64 in that same month  Addendum; she will have to be discharged in the a.m. secondary to [facility] nursing facility not wanting to do so patient late in the afternoon   HPI/Subjective:  72 yo PMHx CVA, colon cancer (S/P colectomy 08/2008), advanced dementia, CHF, hypertension, hyperlipidemia, CVA, DM. Was sent from [facility] skilled nursing facility today for syncopal episode 2 days ago and chest pain. At the time of my encounter patient is pleasantly confused and does not know why he is in the hospital. He denies any chest pain to me. He does not remember that he had a syncopal episode.  Point of care troponin was 0.03. Creatinine is elevated at 1.47. EKG showed rate of 64, T wave inversions in V5 and V6, hospitalist team requested admission for CP rule out and syncope workup. TODAY pleasant gentleman resting comfortably in his bed and eating his lunch. Obeys all commands but otherwise unable to interact appropriately. Patient has been up and about with no episodes of syncope last presyncope. Patient's  cardiac echo shows no acute findings to explain patient's syncope, most likely secondary to nursing home on patient's blood pressure to drift to low. 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   Doppler parameters are consistent with abnormal left ventricular relaxation (grade 1 diastolic dysfunction). - Aortic valve: Mildly thickened leaflets, with no significant stenosis. - Left atrium: The atrium was mildly dilated. - Tricuspid valve: Mild regurgitation.   Discharge Exam: Filed Vitals:   11/23/12 1858 11/23/12 2123 11/23/12 2141 11/24/12 0618  BP: 136/98  172/91 136/80  Pulse:   60 53  Temp:   98.2 F (36.8 C) 98 F (36.7 C)  TempSrc:   Oral Oral  Resp:   18 16  Height:      Weight:    90.719 kg (200 lb)  SpO2:  97% 96% 99%   General: Alert,NAD  Cardiovascular: Regular rhythm and rate  Respiratory: Clear to auscultation bilateral  Abdomen: Soft nontender nondistended plus bowel sounds   Discharge Instructions     Medication List    ASK your doctor about these medications       albuterol (2.5 MG/3ML) 0.083% nebulizer solution  Commonly known as:  PROVENTIL  Take 2.5 mg by nebulization every 6 (six) hours as needed for wheezing.     amLODipine 10 MG tablet  Commonly known  as:  NORVASC  Take 10 mg by mouth every evening. Hold for SBP<110     atenolol 100 MG tablet  Commonly known as:  TENORMIN  Take 100 mg by mouth daily. Hold for SBP less than 110 and pulse less than 50     atorvastatin 20 MG tablet  Commonly known as:  LIPITOR  Take 20 mg by mouth at bedtime.     betaxolol 0.25 % ophthalmic suspension  Commonly known as:  BETOPTIC-S  Place 1 drop into both eyes 2 (two) times daily.     carbamazepine 100 MG chewable tablet  Commonly known as:  TEGRETOL  Chew 200 mg by mouth every evening. Takes 100 mg in the am and 200 mg every evening     cloNIDine 0.3 MG tablet  Commonly known as:  CATAPRES  Take 0.3 mg by mouth 3 (three) times daily.     Cranberry 475 MG Caps  Take 475 mg by mouth 2 (two) times daily.     divalproex 500 MG DR tablet  Commonly known as:  DEPAKOTE  Take 500 mg by mouth 3 (three) times daily.     donepezil 10 MG tablet  Commonly known as:  ARICEPT  Take 10 mg by mouth at bedtime.     FLUoxetine 20 MG tablet  Commonly known as:  PROZAC  Take 20 mg by mouth daily.     hydrALAZINE 100 MG tablet  Commonly known as:  APRESOLINE  Take 100 mg by mouth every 8 (eight) hours.     insulin aspart 100 UNIT/ML injection  Commonly known as:  novoLOG  - Inject into the skin 3 (three) times daily before meals. Sliding scale  - 70-120 no insulin 121-150=3 units 151-200=4 units 201-250=7 units 251-300=11 units 351-400=20 units     insulin glargine 100 UNIT/ML injection  Commonly known as:  LANTUS  Inject 14 Units into the skin at bedtime.     ipratropium 0.02 % nebulizer solution  Commonly known as:  ATROVENT  Take 2.5 mLs (0.5 mg total) by nebulization every 6 (six) hours.     memantine 10 MG tablet  Commonly known as:  NAMENDA  Take 10 mg by mouth 2 (two) times daily.     NAMENDA XR 28 MG Cp24  Generic drug:  Memantine HCl ER  Take 28 mg by mouth daily.     tolterodine 4 MG 24 hr capsule  Commonly known as:  DETROL LA   Take 4 mg by mouth daily.     travoprost (benzalkonium) 0.004 % ophthalmic solution  Commonly known as:  TRAVATAN  Place 1 drop into both eyes at bedtime.     vitamin C 500 MG tablet  Commonly known as:  ASCORBIC ACID  Take 500 mg by mouth daily.       No Known Allergies    The results of significant diagnostics from this hospitalization (including imaging, microbiology, ancillary and laboratory) are listed below for reference.    Significant Diagnostic Studies: Dg Chest 2  View  11/22/2012   *RADIOLOGY REPORT*  Clinical Data: Abnormal EKG, short of breath  CHEST - 2 VIEW  Comparison: Prior chest x-ray 07/08/2012  Findings: Very low inspiratory volumes.  Given the lower lung volumes, the degree of enlargement the cardiopericardial silhouette is essentially unchanged.  There is bibasilar atelectasis. Pulmonary vascular congestion appears exaggerated due to the low lung volumes.  No overt edema.  No pleural effusion or pneumothorax.  No acute osseous abnormality.  IMPRESSION:  1.  Very low inspiratory volumes with bibasilar atelectasis. 2.  Stable cardiomegaly and pulmonary vascular congestion without overt edema.   Original Report Authenticated By: Malachy Moan, M.D.   Ct Head Wo Contrast  11/23/2012   *RADIOLOGY REPORT*  Clinical Data: Syncope,  dementia  CT HEAD WITHOUT CONTRAST  Technique:  Contiguous axial images were obtained from the base of the skull through the vertex without contrast.  Comparison: Head CT 07/28/2011  Findings: Remote infarctions in the left temporal lobe and the left cerebellum again demonstrated.  There is ex vacuo dilatation of the left lateral ventricle and atrium.  No evidence of acute cortical infarction.  There is extensive periventricular and deep white matter hypodensities consistent small vessel ischemia.  Generalized cortical atrophy is unchanged.  No new or intracranial hemorrhage. Remote lacunar infarction in the left thalmus.  Paranasal sinuses and  mastoid air cells are clear.  Orbits are normal.  IMPRESSION:  1.  No acute intracranial findings.  No change from prior. 2.  Remote left temporal and cerebellar infarctions. 3.  Extensive atrophy and microvascular disease.   Original Report Authenticated By: Genevive Bi, M.D.    Microbiology: Recent Results (from the past 240 hour(s))  MRSA PCR SCREENING     Status: None   Collection Time    11/22/12  9:29 PM      Result Value Range Status   MRSA by PCR NEGATIVE  NEGATIVE Final   Comment:            The GeneXpert MRSA Assay (FDA     approved for NASAL specimens     only), is one component of a     comprehensive MRSA colonization     surveillance program. It is not     intended to diagnose MRSA     infection nor to guide or     monitor treatment for     MRSA infections.     Labs: Basic Metabolic Panel:  Recent Labs Lab 11/22/12 1229 11/22/12 1848 11/23/12 0530  NA 143  --  141  K 3.7  --  3.7  CL 103  --  106  CO2 32  --  25  GLUCOSE 87  --  83  BUN 19  --  19  CREATININE 1.47* 1.33 1.45*  CALCIUM 9.0  --  8.1*   Liver Function Tests:  Recent Labs Lab 11/22/12 1229  AST 18  ALT 17  ALKPHOS 79  BILITOT 0.2*  PROT 7.8  ALBUMIN 3.0*   No results found for this basename: LIPASE, AMYLASE,  in the last 168 hours No results found for this basename: AMMONIA,  in the last 168 hours CBC:  Recent Labs Lab 11/22/12 1229 11/22/12 1848 11/23/12 0530  WBC 6.5 8.3 6.8  NEUTROABS 3.1  --   --   HGB 13.5 13.6 12.3*  HCT 40.4 39.6 36.3*  MCV 95.1 95.4 94.5  PLT 233 237 237   Cardiac Enzymes:  Recent Labs Lab 11/22/12 1849 11/22/12 2330 11/23/12 0530  CKTOTAL 173 143 124  CKMB 3.9 3.3 3.2  TROPONINI <0.30 <0.30 <0.30   BNP: BNP (last 3 results)  Recent Labs  07/08/12 1605  PROBNP 444.5*   CBG:  Recent Labs Lab 11/23/12 0614 11/23/12 1147 11/23/12 1751 11/23/12 2134 11/24/12 0559  GLUCAP 78 93 93 144* 92       Signed:  Kyleigha Markert,  Najia Hurlbutt, J  [facility] 11/24/2012, 7:10 AM

## 2012-11-24 NOTE — Progress Notes (Signed)
Pt has not had a spontaneous void since I &O cath done at 1400. Bladder scan shows 367cc. MD made aware of the above new order for I& O cath given. Benay Pike

## 2012-11-25 LAB — URINE CULTURE

## 2012-11-25 LAB — GLUCOSE, CAPILLARY: Glucose-Capillary: 123 mg/dL — ABNORMAL HIGH (ref 70–99)

## 2012-11-25 NOTE — Progress Notes (Signed)
Patient for d/c today to SNF bed at [facility].  Patient's sister, Delray Alt, agreeable to this plan- plan transfer via EMS. Reece Levy, MSW, Theresia Majors [phone]

## 2012-11-25 NOTE — Progress Notes (Signed)
While awaiting for I & O cath to come to unit from central supply area. Pt had an incontinent urine episode. No need to      I & O pt at this time. Will cont to monitor.  Benay Pike

## 2012-11-25 NOTE — Progress Notes (Signed)
Occupational Therapy Discharge Patient Details Name: REYAAN THOMA MRN: [id number] DOB: 1941-04-21 Today's Date: 11/25/2012 Time:  -     Patient discharged from OT services secondary to pt is from SNF and plan is to go back to SNF and Medicare is primary insurance. Acute OT will defer eval to that facility. Acute OT will sign off.Evette Georges  [phone]  11/25/2012, 10:04 AM

## 2012-11-25 NOTE — Progress Notes (Signed)
1330  Discharrged to  [facility] via [facility]

## 2012-11-25 NOTE — Progress Notes (Signed)
1354 Telephone report given to Marisue Ivan ,  Nurse supervisor [facility]

## 2012-11-25 NOTE — Progress Notes (Signed)
Clinical Social Work Department BRIEF PSYCHOSOCIAL ASSESSMENT 11/25/2012  Patient:  [name], [name]     Account Number:  [id number]     Admit date:  11/22/2012  Clinical Social Worker:  Robin Searing  Date/Time:  11/25/2012 11:30 AM  Referred by:  Physician  Date Referred:  11/25/2012 Referred for  SNF Placement   Other Referral:   Interview type:  Patient Other interview type:   MESSAGE LEFT FOR FMAILY    PSYCHOSOCIAL DATA Living Status:  FACILITY Admitted from facility:  [facility] Level of care:  Skilled Nursing Facility Primary support name:  sister/niece/nephew Primary support relationship to patient:  FAMILY Degree of support available:   good    CURRENT CONCERNS Current Concerns  Post-Acute Placement   Other Concerns:    SOCIAL WORK ASSESSMENT / PLAN Patient admitted from [facility] SNF- long term care. Patient is ready for d/c back today- confirmed this plan with SNF- awaiting callback from family -   Assessment/plan status:  Other - See comment Other assessment/ plan:   FL2 updated for SNF return today   Information/referral to community resources:   SNF  EMS    PATIENT'S/FAMILY'S RESPONSE TO PLAN OF CARE: Awaiting callback from family- then will plan transfer back to SNF via EMS>    Reece Levy, MSW [phone]

## 2012-11-29 NOTE — Progress Notes (Signed)
11/23/12 1204  PT Time Calculation  PT Start Time 1135  PT Stop Time 1156  PT Time Calculation (min) 21 min  PT G-Codes **NOT FOR INPATIENT CLASS**  Functional Assessment Tool Used clinical judgement  Functional Limitation Mobility: Walking and moving around  Mobility: Walking and Moving Around Current Status (Z6109) CJ  PT General Charges  $$ ACUTE PT VISIT 1 Procedure  PT Evaluation  $Initial PT Evaluation Tier I 1 Procedure  PT Treatments  $Gait Training 8-22 mins  Progress note Addendum to 11/23/12 treatment note Delaney Meigs, PT [phone]

## 2012-12-04 ENCOUNTER — Encounter: Payer: Self-pay | Admitting: Adult Health

## 2012-12-04 ENCOUNTER — Non-Acute Institutional Stay (SKILLED_NURSING_FACILITY): Payer: Medicare Other | Admitting: Adult Health

## 2012-12-04 DIAGNOSIS — R569 Unspecified convulsions: Secondary | ICD-10-CM

## 2012-12-04 DIAGNOSIS — B370 Candidal stomatitis: Secondary | ICD-10-CM | POA: Insufficient documentation

## 2012-12-04 DIAGNOSIS — E7800 Pure hypercholesterolemia, unspecified: Secondary | ICD-10-CM

## 2012-12-04 DIAGNOSIS — F329 Major depressive disorder, single episode, unspecified: Secondary | ICD-10-CM

## 2012-12-04 DIAGNOSIS — R32 Unspecified urinary incontinence: Secondary | ICD-10-CM | POA: Insufficient documentation

## 2012-12-04 DIAGNOSIS — E162 Hypoglycemia, unspecified: Secondary | ICD-10-CM | POA: Insufficient documentation

## 2012-12-04 DIAGNOSIS — M109 Gout, unspecified: Secondary | ICD-10-CM

## 2012-12-04 DIAGNOSIS — F0390 Unspecified dementia without behavioral disturbance: Secondary | ICD-10-CM

## 2012-12-04 DIAGNOSIS — E119 Type 2 diabetes mellitus without complications: Secondary | ICD-10-CM

## 2012-12-04 DIAGNOSIS — I10 Essential (primary) hypertension: Secondary | ICD-10-CM

## 2012-12-04 NOTE — Progress Notes (Signed)
Patient ID: [name], male   DOB: [birth year], 72 y.o.   MRN: [id number]        PROGRESS NOTE  DATE:  12/04/12  FACILITY: Nursing Home Location: [facility]  LEVEL OF CARE: SNF (31)  Routine Visit  CHIEF COMPLAINT:  Manage Hypertensin, seizure, diabetes mellitus type 2, gout and depression  HISTORY OF PRESENT ILLNESS:  REASSESSMENT OF ONGOING PROBLEM(S):  DEMENTIA: The dementia remaines stable and continues to function adequately in the current living environment with supervision.  The patient has had little changes in behavior. No complications noted from the medications presently being used.  DEPRESSION: The depression remains stable. Patient denies ongoing feelings of sadness, insomnia, anedhonia or lack of appetite. No complications reported from the medications currently being used. Staff do not report behavioral problems.  HTN: Pt 's HTN remains stable.  Denies CP, sob, DOE, pedal edema, headaches, dizziness or visual disturbances.  No complications from the medications currently being used.  Last BP : 148/90   PAST MEDICAL HISTORY : Reviewed.  No changes.  CURRENT MEDICATIONS: Reviewed per [facility]  REVIEW OF SYSTEMS:  GENERAL: no change in appetite, no fatigue, no weight changes, no fever, chills or weakness RESPIRATORY: no cough, SOB, DOE, wheezing, hemoptysis CARDIAC: no chest pain, edema or palpitations GI: no abdominal pain, diarrhea, constipation, heart burn, nausea or vomiting  PHYSICAL EXAMINATION  VS:  T 97.2      P63      RR20       BP 148/90      POX 95%     WT 204.6  (Lb)  GENERAL: no acute distress, normal body habitus NECK: supple, trachea midline, no neck masses, no thyroid tenderness, no thyromegaly RESPIRATORY: breathing is even & unlabored, BS CTAB CARDIAC: RRR, no murmur,no extra heart sounds, no edema GI: abdomen soft, normal BS, no masses, no tenderness, no hepatomegaly, no splenomegaly PSYCHIATRIC: the patient is alert &  oriented to person, affect & behavior appropriate  LABS/RADIOLOGY: 10/17/12  Wbc 8.8  hgb 14.6  hct 41.9  NA 147  K 4.2  Glucose 88  BUN 22  Creatinine 1.49  Total protein 8.2  albumin for a  3.9  Calcium 9.3  09/09/12  Wbc 6.9 hgb 13.3  hct 38  NA 132  K 3.8  Glucose 10  BUN 10  Creatinine 0.76   Total protein 6.1  Albumin 3.6 08/2012:  Creatinine 1.24,  BMP normal,tegretol level 5.9, depakote level 30.8  Albumin 3.4, otherwise CMP normal.     Hemoglobin 12.5, MCV 92.4, otherwise CBC normal.    05/2012:  Iron panel normal.    ASSESSMENT/PLAN:  Diabetes mellitus.  Well controlled   Hypertension -stable  Hyperlipidemia.  Well controlled.    Alzheimer's dementia.  Moderate advanced.    Seizure disorder.  Well controlled.    Urinary incontinence.  stable   Gout.-  stable.    Depression. - stable     CODE: [id number]

## 2012-12-04 NOTE — Progress Notes (Signed)
Patient ID: [name], male   DOB: 05-17-1940, 72 y.o.   MRN: [id number]        PROGRESS NOTE  DATE: 10/21/2012  FACILITY: Nursing Home Location: [facility]  LEVEL OF CARE: SNF (31)  Routine Visit  CHIEF COMPLAINT:  Manage Hypertensin, seizure, diabetes mellitus type 2, gout and depression  HISTORY OF PRESENT ILLNESS:  REASSESSMENT OF ONGOING PROBLEM(S):  GOUT: Patien'st  gout remains stable. Patient denies joint pan, redness, swelling or warmth. No complications reported from the medications presently being used.  HTN: Pt 's HTN remains stable.  Denies CP, sob, DOE, pedal edema, headaches, dizziness or visual disturbances.  No complications from the medications currently being used.  Last BP : 152/94   PAST MEDICAL HISTORY : Reviewed.  No changes.  CURRENT MEDICATIONS: Reviewed per [facility]  REVIEW OF SYSTEMS:  GENERAL: no change in appetite, no fatigue, no weight changes, no fever, chills or weakness RESPIRATORY: no cough, SOB, DOE, wheezing, hemoptysis CARDIAC: no chest pain, edema or palpitations GI: no abdominal pain, diarrhea, constipation, heart burn, nausea or vomiting  PHYSICAL EXAMINATION  VS:  T 96.3       P67      RR19        BP 152/94      POX 96%     WT 209.6  (Lb)  GENERAL: no acute distress, normal body habitus NECK: supple, trachea midline, no neck masses, no thyroid tenderness, no thyromegaly LYMPHATICS: no LAN in the neck, no supraclavicular LAN RESPIRATORY: breathing is even & unlabored, BS CTAB CARDIAC: RRR, no murmur,no extra heart sounds, no edema GI: abdomen soft, normal BS, no masses, no tenderness, no hepatomegaly, no splenomegaly PSYCHIATRIC: the patient is alert & oriented to person, affect & behavior appropriate  LABS/RADIOLOGY: 10/17/12  Wbc 8.8  hgb 14.6  hct 41.9  NA 147  K 4.2  Glucose 88  BUN 22  Creatinine 1.49  Total protein 8.2  albumin for a  3.9  Calcium 9.3  09/09/12  Wbc 6.9 hgb 13.3  hct 38  NA 132  K 3.8   Glucose 10  BUN 10  Creatinine 0.76   Total protein 6.1  Albumin 3.6 08/2012:  Creatinine 1.24,  BMP normal,tegretol level 5.9, depakote level 30.8  Albumin 3.4, otherwise CMP normal.     Hemoglobin 12.5, MCV 92.4, otherwise CBC normal.    05/2012:  Iron panel normal.    ASSESSMENT/PLAN:  Diabetes mellitus.  Well controlled   Hypertension -stable  Hyperlipidemia.  Well controlled.    Alzheimer's dementia.  Moderate advanced.    Seizure disorder.  Well controlled.    Urinary incontinence.  stable   Gout.-  stable.    Depression. - stable     CODE: [id number]

## 2012-12-04 NOTE — Progress Notes (Signed)
Patient ID: [name], male   DOB: Aug 16, 1940, 72 y.o.   MRN: [id number]       PROGRESS NOTE  DATE: 08/12/2012  FACILITY: Nursing Home Location: [facility]  LEVEL OF CARE: SNF (31)  Routine Visit  CHIEF COMPLAINT:  Manage Hypoglycemia  HISTORY OF PRESENT ILLNESS: This is a 72 year old male who had an episode of low a.m. blood sugar 49 . He is currently taking Lantus routinely for Diabetes mellitus. It is also noted on his BMP result as follows:  NA 146  glucose 56 BUN 13 creatinine 1.53 .   ASSESSMENT OF ONGOING PROBLEM(S):  HTN: Pt 's HTN remains stable.  Denies CP, sob, DOE, pedal edema, headaches, dizziness or visual disturbances.  No complications from the medications currently being used.  Last BP : 128/68  DEMENTIA: The dementia remaines stable and continues to function adequately in the current living environment with supervision.  The patient has had little changes in behavior. No complications noted from the medications presently being used.  PAST MEDICAL HISTORY : Reviewed.  No changes.  CURRENT MEDICATIONS: Reviewed per [facility]  REVIEW OF SYSTEMS:  GENERAL: no change in appetite, no fatigue, no weight changes, no fever, chills or weakness RESPIRATORY: no cough, SOB, DOE, wheezing, hemoptysis CARDIAC: no chest pain, edema or palpitations GI: no abdominal pain, diarrhea, constipation, heart burn, nausea or vomiting  PHYSICAL EXAMINATION  VS:  T 97.2      P70      RR16     BP128/68     POX 95 %     WT212 (Lb)  GENERAL: no acute distress, normal body habitus EYES: conjunctivae normal, sclerae normal, normal eye lids LYMPHATICS: no LAN in the neck, no supraclavicular LAN RESPIRATORY: breathing is even & unlabored, BS CTAB CARDIAC: RRR, no murmur,no extra heart sounds, no edema GI: abdomen soft, normal BS, no masses, no tenderness, no hepatomegaly, no splenomegaly PSYCHIATRIC: the patient is alert & oriented to person, affect & behavior  appropriate  LABS/RADIOLOGY: 08/09/12 WBC 7.5 hemoglobin 12.5  hematocrit 36.6 sodium 146 potassium 3.7 glucose 56 BUN 13 creatinine 1.53 calcium 8.6   ASSESSMENT/PLAN:  Diabetes mellitus, type II - decrease Lantus to 28 units subcutaneous each bedtime and continue Lantus 33 units subcutaneous q. A.m.  Hypernatremia - encourage oral fluids to a T score 0.5 L per day; BMP on 08/15/12    CPT CODE: 78295

## 2012-12-04 NOTE — Progress Notes (Signed)
Patient ID: [name], male   DOB: [birth year], 72 y.o.   MRN: [id number]       PROGRESS NOTE  DATE: 09/16/2012  FACILITY: Nursing Home Location: [facility]  LEVEL OF CARE: SNF (31)  Acute Visit  CHIEF COMPLAINT:  Manage Oral Yeast Infection  HISTORY OF PRESENT ILLNESS: This is a 72 year old male was being seen due to thick whitish coating on his tongue. Patient was recently hospitalized and was put on oral steroid and antibiotic.  REASSESSMENT OF ONGOING PROBLEM(S):  DEMENTIA: The dementia remaines stable and continues to function adequately in the current living environment with supervision.  The patient has had little changes in behavior. No complications noted from the medications presently being used.  DM:pt's DM remains stable.  Pt denies polyuria, polydipsia, polyphagia, changes in vision or hypoglycemic episodes.  No complications noted from the medication presently being used.  Last hemoglobin A1c is:05/2012:  Hemoglobin A1C 7.4  PAST MEDICAL HISTORY : Reviewed.  No changes.  CURRENT MEDICATIONS: Reviewed per [facility]  REVIEW OF SYSTEMS:  GENERAL: no change in appetite, no fatigue, no weight changes, no fever, chills or weakness RESPIRATORY: no cough, SOB, DOE, wheezing, hemoptysis CARDIAC: no chest pain, edema or palpitations GI: no abdominal pain, diarrhea, constipation, heart burn, nausea or vomiting  PHYSICAL EXAMINATION  VS:  T 97.6       P60      RR 18      BP 129/85     POX 95%     WT 218.2 (Lb)  GENERAL: no acute distress, normal body habitus NECK: supple, trachea midline, no neck masses, no thyroid tenderness, no thyromegaly LYMPHATICS: no LAN in the neck, no supraclavicular LAN RESPIRATORY: breathing is even & unlabored, BS CTAB CARDIAC: RRR, no murmur,no extra heart sounds, no edema GI: abdomen soft, normal BS, no masses, no tenderness, no hepatomegaly, no splenomegaly PSYCHIATRIC: the patient is alert & oriented to person, affect &  behavior appropriate  LABS/RADIOLOGY: 08/2012:  Creatinine 1.24, otherwise BMP normal.   Albumin 3.4, otherwise CMP normal.     Hemoglobin 12.5, MCV 92.4, otherwise CBC normal.    05/2012:  Iron panel normal.    ASSESSMENT/PLAN:  Candida Infection, Oral  - start Fluconazole 100 mg 1 tab PO Q D x 14 days; hold Lipitor while on Fluconazole   CPT CODE: [id number]

## 2012-12-17 NOTE — Progress Notes (Signed)
Late entry for missed goal status of G-codes as corrected by Prudencio Pair, PT on 11/29/12  11/23/12 1204  PT Time Calculation  PT Start Time 1135  PT Stop Time 1156  PT Time Calculation (min) 21 min  PT G-Codes **NOT FOR INPATIENT CLASS**  Functional Assessment Tool Used clinical judgement  Functional Limitation Mobility: Walking and moving around  Mobility: Walking and Moving Around Current Status (Z6109) CJ  Mobility: Walking and Moving Around Goal Status (U0454) CJ  PT General Charges  $$ ACUTE PT VISIT 1 Procedure  PT Evaluation  $Initial PT Evaluation Tier I 1 Procedure  PT Treatments  $Gait Training 8-22 mins

## 2012-12-25 ENCOUNTER — Non-Acute Institutional Stay (SKILLED_NURSING_FACILITY): Payer: Medicare Other | Admitting: Internal Medicine

## 2012-12-25 DIAGNOSIS — I10 Essential (primary) hypertension: Secondary | ICD-10-CM

## 2012-12-25 DIAGNOSIS — E7800 Pure hypercholesterolemia, unspecified: Secondary | ICD-10-CM

## 2012-12-25 DIAGNOSIS — F0390 Unspecified dementia without behavioral disturbance: Secondary | ICD-10-CM

## 2012-12-25 DIAGNOSIS — E119 Type 2 diabetes mellitus without complications: Secondary | ICD-10-CM

## 2012-12-27 NOTE — Progress Notes (Signed)
Patient ID: [name], male   DOB: [birth year], 72 y.o.   MRN: [id number]        PROGRESS NOTE  DATE:  12/25/2012  FACILITY: [facility]   LEVEL OF CARE: SNF  Routine Visit  CHIEF COMPLAINT:  Manage diabetes mellitus, hypertension and hyperlipidemia.   HISTORY OF PRESENT ILLNESS:  REASSESSMENT OF ONGOING PROBLEM(S):  DM:pt's DM remains stable.  Pt denies polyuria, polydipsia, polyphagia, changes in vision or hypoglycemic episodes.  No complications noted from the medication presently being used.  Last hemoglobin A1c is: 05/2012:  Hemoglobin A1C 7.4, is 7/14 hemoglobin A1c 6.4  HTN: Pt 's HTN remains stable.  Denies CP, sob, DOE, pedal edema, headaches, dizziness or visual disturbances.  No complications from the medications currently being used.  Last BP :134/70, 156/99, 158/92, 156/82, 151/57, 145/56, 169/91.  HYPERLIPIDEMIA: No complications from the medications presently being used. Last fasting lipid panel showed : 08/2012:  Fasting lipid panel normal.    PAST MEDICAL HISTORY : Reviewed.  No changes.  CURRENT MEDICATIONS: Reviewed per [facility]  REVIEW OF SYSTEMS:  Difficult to obtain due to dementia.   PHYSICAL EXAMINATION  VS:  T 97.6       P  62    RR 20      BP 151/57     POX 98% room air      WT (Lb)  GENERAL: no acute distress, normal body habitus EYES: conjunctivae normal, sclerae normal, normal eye lids NECK: supple, trachea midline, no neck masses, no thyroid tenderness, no thyromegaly LYMPHATICS: no LAN in the neck, no supraclavicular LAN RESPIRATORY: breathing is even & unlabored, BS CTAB CARDIAC: RRR, no murmur,no extra heart sounds EDEMA/VARICOSITIES:  +1 bilateral lower extremity edema  ARTERIAL:  pedal pulses +1  GI: abdomen soft, normal BS, no masses, no tenderness, no hepatomegaly, no splenomegaly PSYCHIATRIC: the patient is alert & disoriented, affect & behavior appropriate  LABS/RADIOLOGY:  7/14  Hb 12.5, mcv 93.6 otherwise CBC normal, glucose  156, creatinine 1.61 otherwise BMP normal, Tegretol level 5.2 6/14 creatinine 1.49 otherwise CMP normal 08/2012:  Creatinine 1.24, otherwise BMP normal,tegretol level 5.9, depakote level 30.8  Albumin 3.4, otherwise CMP normal.     Hemoglobin 12.5, MCV 92.4, otherwise CBC normal.    05/2012:  Iron panel normal.  TSH 3.954.  Vitamin B12 level 385, folate 8.6, ferritin 136.  ASSESSMENT/PLAN:  Diabetes mellitus.  Well controlled.  Hypertension.  Blood pressure elevated.  On maximum medications.  Hyperlipidemia.  Well controlled.    Alzheimer's dementia.  Moderate advanced.    Seizure disorder.  Well controlled.    Urinary incontinence.  Continue Detrol LA.   Gout.  Continue allopurinol.    Depression.  Continue current antidepressant.   Check Depakote level  CPT CODE: 24401

## 2013-01-16 ENCOUNTER — Non-Acute Institutional Stay (SKILLED_NURSING_FACILITY): Payer: Medicare Other | Admitting: Internal Medicine

## 2013-01-16 DIAGNOSIS — E7800 Pure hypercholesterolemia, unspecified: Secondary | ICD-10-CM

## 2013-01-16 DIAGNOSIS — F0390 Unspecified dementia without behavioral disturbance: Secondary | ICD-10-CM

## 2013-01-16 DIAGNOSIS — I10 Essential (primary) hypertension: Secondary | ICD-10-CM

## 2013-01-16 DIAGNOSIS — E119 Type 2 diabetes mellitus without complications: Secondary | ICD-10-CM

## 2013-01-16 NOTE — Progress Notes (Signed)
Patient ID: [name], male   DOB: 1941/02/20, 72 y.o.   MRN: [id number]        PROGRESS NOTE  DATE:  01/16/2013  FACILITY: [facility]   LEVEL OF CARE: SNF  Routine Visit  CHIEF COMPLAINT:  Manage diabetes mellitus, hypertension and hyperlipidemia.   HISTORY OF PRESENT ILLNESS:  REASSESSMENT OF ONGOING PROBLEM(S):  DM:pt's DM remains stable.  Pt denies polyuria, polydipsia, polyphagia, changes in vision or hypoglycemic episodes.  No complications noted from the medication presently being used.  Last hemoglobin A1c is: 05/2012:  Hemoglobin A1C 7.4, is 7/14 hemoglobin A1c 6.4  HTN: Pt 's HTN remains stable.  Denies CP, sob, DOE, pedal edema, headaches, dizziness or visual disturbances.  No complications from the medications currently being used.  Last BP :134/70, 156/99, 158/92, 156/82, 151/57, 145/56, 169/91, 127/61.  HYPERLIPIDEMIA: No complications from the medications presently being used. Last fasting lipid panel showed : 08/2012:  Fasting lipid panel normal.    PAST MEDICAL HISTORY : Reviewed.  No changes.  CURRENT MEDICATIONS: Reviewed per [facility]  REVIEW OF SYSTEMS:  Difficult to obtain due to dementia.   PHYSICAL EXAMINATION  VS:  T 97.4       P  64    RR 16      BP 127/61     POX 99% room air      WT (Lb)  GENERAL: no acute distress, normal body habitus NECK: supple, trachea midline, no neck masses, no thyroid tenderness, no thyromegaly RESPIRATORY: breathing is even & unlabored, BS CTAB CARDIAC: RRR, no murmur,no extra heart sounds EDEMA/VARICOSITIES:  +1 bilateral lower extremity edema  ARTERIAL:  pedal pulses +1  GI: abdomen soft, normal BS, no masses, no tenderness, no hepatomegaly, no splenomegaly PSYCHIATRIC: the patient is alert & disoriented, affect & behavior appropriate  LABS/RADIOLOGY:  8-14 Depakote level 41.4  7/14  Hb 12.5, mcv 93.6 otherwise CBC normal, glucose 156, creatinine 1.61 otherwise BMP normal, Tegretol level 5.2 6/14 creatinine 1.49  otherwise CMP normal 08/2012:  Creatinine 1.24, otherwise BMP normal,tegretol level 5.9, depakote level 30.8  Albumin 3.4, otherwise CMP normal.     Hemoglobin 12.5, MCV 92.4, otherwise CBC normal.    05/2012:  Iron panel normal.  TSH 3.954.  Vitamin B12 level 385, folate 8.6, ferritin 136.  ASSESSMENT/PLAN:  Diabetes mellitus.  Well controlled.  Hypertension.  Blood pressure stable  Hyperlipidemia.  Well controlled.    Alzheimer's dementia.  Moderate advanced.    Seizure disorder.  Well controlled.    Urinary incontinence.  Continue Detrol LA.   Gout.  Continue allopurinol.    Depression.  Continue current antidepressant.  CPT CODE: 78295

## 2013-01-31 ENCOUNTER — Encounter: Payer: Self-pay | Admitting: Adult Health

## 2013-03-03 ENCOUNTER — Other Ambulatory Visit: Payer: Self-pay | Admitting: *Deleted

## 2013-03-03 MED ORDER — LORAZEPAM 0.5 MG PO TABS: ORAL_TABLET | ORAL | Status: DC

## 2013-03-05 ENCOUNTER — Non-Acute Institutional Stay (SKILLED_NURSING_FACILITY): Payer: PRIVATE HEALTH INSURANCE | Admitting: Internal Medicine

## 2013-03-05 DIAGNOSIS — E119 Type 2 diabetes mellitus without complications: Secondary | ICD-10-CM

## 2013-03-05 DIAGNOSIS — I10 Essential (primary) hypertension: Secondary | ICD-10-CM

## 2013-03-05 DIAGNOSIS — E7800 Pure hypercholesterolemia, unspecified: Secondary | ICD-10-CM

## 2013-03-05 DIAGNOSIS — F0390 Unspecified dementia without behavioral disturbance: Secondary | ICD-10-CM

## 2013-03-05 NOTE — Progress Notes (Signed)
Patient ID: [name], male   DOB: Sep 02, 1940, 72 y.o.   MRN: [id number]        PROGRESS NOTE  DATE:  03/05/2013  FACILITY: [facility]   LEVEL OF CARE: SNF  Routine Visit  CHIEF COMPLAINT:  Manage diabetes mellitus, hypertension and hyperlipidemia.   HISTORY OF PRESENT ILLNESS:  REASSESSMENT OF ONGOING PROBLEM(S):  DM:pt's DM remains stable.  Pt denies polyuria, polydipsia, polyphagia, changes in vision or hypoglycemic episodes.  No complications noted from the medication presently being used.  Last hemoglobin A1c is: 05/2012:  Hemoglobin A1C 7.4, is 7/14 hemoglobin A1c 6.4  HTN: Pt 's HTN remains stable.  Denies CP, sob, DOE, pedal edema, headaches, dizziness or visual disturbances.  No complications from the medications currently being used.  Last BP :134/70, 156/99, 158/92, 156/82, 151/57, 145/56, 169/91, 127/61, 114/79.  HYPERLIPIDEMIA: No complications from the medications presently being used. Last fasting lipid panel showed : 08/2012:  Fasting lipid panel normal.    PAST MEDICAL HISTORY : Reviewed.  No changes.  CURRENT MEDICATIONS: Reviewed per [facility]  REVIEW OF SYSTEMS:  Difficult to obtain due to dementia.   PHYSICAL EXAMINATION  VS:  T 97.2       P  56    RR 16      BP 114/79     POX 95% room air      WT (Lb)  GENERAL: no acute distress, normal body habitus EYES: Normal sclerae, normal conjunctivae, no discharge NECK: supple, trachea midline, no neck masses, no thyroid tenderness, no thyromegaly LYMPHATICS: No cervical lymphadenopathy, no supraclavicular lymphadenopathy RESPIRATORY: breathing is even & unlabored, BS CTAB CARDIAC: RRR, no murmur,no extra heart sounds EDEMA/VARICOSITIES:  +1 bilateral lower extremity edema  ARTERIAL:  pedal pulses +1  GI: abdomen soft, normal BS, no masses, no tenderness, no hepatomegaly, no splenomegaly PSYCHIATRIC: the patient is alert & disoriented, affect & behavior appropriate  LABS/RADIOLOGY:  10- 14 MCV 100.2  otherwise CBC normal, glucose 137 otherwise BMP normal, Depakote level 30  8-14 Depakote level 41.4  7/14  Hb 12.5, mcv 93.6 otherwise CBC normal, glucose 156, creatinine 1.61 otherwise BMP normal, Tegretol level 5.2 6/14 creatinine 1.49 otherwise CMP normal 08/2012:  Creatinine 1.24, otherwise BMP normal,tegretol level 5.9, depakote level 30.8  Albumin 3.4, otherwise CMP normal.     Hemoglobin 12.5, MCV 92.4, otherwise CBC normal.    05/2012:  Iron panel normal.  TSH 3.954.  Vitamin B12 level 385, folate 8.6, ferritin 136.  ASSESSMENT/PLAN:  Diabetes mellitus.  Well controlled.  Hypertension.  Blood pressure stable  Hyperlipidemia.  Well controlled.  Check fasting lipid panel  Alzheimer's dementia.  Moderate advanced.    Seizure disorder.  Well controlled.    Urinary incontinence.  Continue Detrol LA.   Gout.  Continue allopurinol.    Depression.  Continue current antidepressant.  CPT CODE: 78295

## 2013-03-26 ENCOUNTER — Non-Acute Institutional Stay (SKILLED_NURSING_FACILITY): Payer: PRIVATE HEALTH INSURANCE | Admitting: Internal Medicine

## 2013-03-26 ENCOUNTER — Encounter: Payer: Self-pay | Admitting: Internal Medicine

## 2013-03-26 DIAGNOSIS — E119 Type 2 diabetes mellitus without complications: Secondary | ICD-10-CM

## 2013-03-26 DIAGNOSIS — I10 Essential (primary) hypertension: Secondary | ICD-10-CM

## 2013-03-26 DIAGNOSIS — F0390 Unspecified dementia without behavioral disturbance: Secondary | ICD-10-CM

## 2013-03-26 DIAGNOSIS — E7800 Pure hypercholesterolemia, unspecified: Secondary | ICD-10-CM

## 2013-03-26 NOTE — Progress Notes (Signed)
Patient ID: TAITE SCHOEPPNER, male   DOB: August 08, 1940, 72 y.o.   MRN: [id number]        PROGRESS NOTE  DATE:  03/26/2013  FACILITY: [facility]   LEVEL OF CARE: SNF  Routine Visit  CHIEF COMPLAINT:  Manage diabetes mellitus, hypertension and hyperlipidemia.   HISTORY OF PRESENT ILLNESS:  REASSESSMENT OF ONGOING PROBLEM(S):  DM:pt's DM remains stable.  Pt denies polyuria, polydipsia, polyphagia, changes in vision or hypoglycemic episodes.  No complications noted from the medication presently being used.  Last hemoglobin A1c is: 05/2012:  Hemoglobin A1C 7.4, is 7/14 hemoglobin A1c 6.4, in 10-14 hemoglobin A1c 6.3  HTN: Pt 's HTN remains stable.  Denies CP, sob, DOE, pedal edema, headaches, dizziness or visual disturbances.  No complications from the medications currently being used.  Last BP :134/70, 156/99, 158/92, 156/82, 151/57, 145/56, 169/91, 127/61, 114/79, 111/74.  HYPERLIPIDEMIA: No complications from the medications presently being used. Last fasting lipid panel showed : 08/2012:  Fasting lipid panel normal.    PAST MEDICAL HISTORY : Reviewed.  No changes.  CURRENT MEDICATIONS: Reviewed per [facility]  REVIEW OF SYSTEMS:  Difficult to obtain due to dementia.   PHYSICAL EXAMINATION  VS:  T 99.6       P  54  RR 16      BP 111/74     POX 97% room air      WT (Lb)  GENERAL: no acute distress, normal body habitus EYES: Normal sclerae, normal conjunctivae, no discharge NECK: supple, trachea midline, no neck masses, no thyroid tenderness, no thyromegaly LYMPHATICS: No cervical lymphadenopathy, no supraclavicular lymphadenopathy RESPIRATORY: breathing is even & unlabored, BS CTAB CARDIAC: RRR, no murmur,no extra heart sounds EDEMA/VARICOSITIES:  +1 bilateral lower extremity edema  ARTERIAL:  pedal pulses +1  GI: abdomen soft, normal BS, no masses, no tenderness, no hepatomegaly, no splenomegaly PSYCHIATRIC: the patient is alert & disoriented, affect & behavior  appropriate  LABS/RADIOLOGY:  10- 14 MCV 100.2 otherwise CBC normal, glucose 137 otherwise BMP normal, Depakote level 30  8-14 Depakote level 41.4  7/14  Hb 12.5, mcv 93.6 otherwise CBC normal, glucose 156, creatinine 1.61 otherwise BMP normal, Tegretol level 5.2 6/14 creatinine 1.49 otherwise CMP normal 08/2012:  Creatinine 1.24, otherwise BMP normal,tegretol level 5.9, depakote level 30.8  Albumin 3.4, otherwise CMP normal.     Hemoglobin 12.5, MCV 92.4, otherwise CBC normal.    05/2012:  Iron panel normal.  TSH 3.954.  Vitamin B12 level 385, folate 8.6, ferritin 136.  ASSESSMENT/PLAN:  Diabetes mellitus.  Well controlled.  Hypertension.  Blood pressure stable  Hyperlipidemia.  Well controlled.  Check fasting lipid panel  Alzheimer's dementia.  Moderate advanced. Tegretol was decreased.   Seizure disorder.  Well controlled.    Urinary incontinence.  Continue Detrol LA.   Gout.  Continue allopurinol.    Depression.  Continue current antidepressant.  CPT CODE: 40981

## 2013-04-16 ENCOUNTER — Non-Acute Institutional Stay (SKILLED_NURSING_FACILITY): Payer: PRIVATE HEALTH INSURANCE | Admitting: Internal Medicine

## 2013-04-16 DIAGNOSIS — F0280 Dementia in other diseases classified elsewhere without behavioral disturbance: Secondary | ICD-10-CM

## 2013-04-16 DIAGNOSIS — I10 Essential (primary) hypertension: Secondary | ICD-10-CM

## 2013-04-16 DIAGNOSIS — E119 Type 2 diabetes mellitus without complications: Secondary | ICD-10-CM

## 2013-04-16 DIAGNOSIS — E7800 Pure hypercholesterolemia, unspecified: Secondary | ICD-10-CM

## 2013-04-18 ENCOUNTER — Encounter: Payer: Self-pay | Admitting: Internal Medicine

## 2013-04-18 DIAGNOSIS — F0280 Dementia in other diseases classified elsewhere without behavioral disturbance: Secondary | ICD-10-CM | POA: Insufficient documentation

## 2013-04-18 NOTE — Progress Notes (Signed)
Patient ID: [name], male   DOB: 1940-07-31, 72 y.o.   MRN: [id number]        PROGRESS NOTE  DATE:  04/16/2013  FACILITY: [facility]   LEVEL OF CARE: SNF  Routine Visit  CHIEF COMPLAINT:  Manage diabetes mellitus, hypertension and hyperlipidemia.   HISTORY OF PRESENT ILLNESS:  REASSESSMENT OF ONGOING PROBLEM(S):  DM:pt's DM remains stable.  Pt denies polyuria, polydipsia, polyphagia, changes in vision or hypoglycemic episodes.  No complications noted from the medication presently being used.  Last hemoglobin A1c is: 05/2012:  Hemoglobin A1C 7.4, is 7/14 hemoglobin A1c 6.4, in 10-14 hemoglobin A1c 6.3  HTN: Pt 's HTN remains stable.  Denies CP, sob, DOE, pedal edema, headaches, dizziness or visual disturbances.  No complications from the medications currently being used.  Last BP :134/70, 156/99, 158/92, 156/82, 151/57, 145/56, 169/91, 127/61, 114/79, 111/74, 126/72.  HYPERLIPIDEMIA: No complications from the medications presently being used. Last fasting lipid panel showed : 08/2012:  Fasting lipid panel normal. in 11-14 LDL 115 otherwise fasting lipid panel normal, Tegretol level 4.3   PAST MEDICAL HISTORY : Reviewed.  No changes.  CURRENT MEDICATIONS: Reviewed per [facility]  REVIEW OF SYSTEMS:  Difficult to obtain due to dementia.   PHYSICAL EXAMINATION  VS:  T 97.6       P 68    RR 18      BP 126/72   POX 98% room air      WT (Lb)  GENERAL: no acute distress, normal body habitus EYES: Normal sclerae, normal conjunctivae, no discharge NECK: supple, trachea midline, no neck masses, no thyroid tenderness, no thyromegaly LYMPHATICS: No cervical lymphadenopathy, no supraclavicular lymphadenopathy RESPIRATORY: breathing is even & unlabored, BS CTAB CARDIAC: RRR, no murmur,no extra heart sounds EDEMA/VARICOSITIES:  +1 bilateral lower extremity edema  ARTERIAL:  pedal pulses +1  GI: abdomen soft, normal BS, no masses, no tenderness, no hepatomegaly, no  splenomegaly PSYCHIATRIC: the patient is alert & disoriented, affect & behavior appropriate  LABS/RADIOLOGY:  10- 14 MCV 100.2 otherwise CBC normal, glucose 137 otherwise BMP normal, Depakote level 30  8-14 Depakote level 41.4  7/14  Hb 12.5, mcv 93.6 otherwise CBC normal, glucose 156, creatinine 1.61 otherwise BMP normal, Tegretol level 5.2 6/14 creatinine 1.49 otherwise CMP normal 08/2012:  Creatinine 1.24, otherwise BMP normal,tegretol level 5.9, depakote level 30.8  Albumin 3.4, otherwise CMP normal.     Hemoglobin 12.5, MCV 92.4, otherwise CBC normal.    05/2012:  Iron panel normal.  TSH 3.954.  Vitamin B12 level 385, folate 8.6, ferritin 136.  ASSESSMENT/PLAN:  Diabetes mellitus.  Well controlled.  Hypertension.  Blood pressure stable  Hyperlipidemia.  LDL not at goal. Increase Lipitor to 40 mg each bedtime. Check fasting lipid panel in 12 weeks.  Alzheimer's dementia.  Moderate advanced.   Seizure disorder.  Well controlled.    Urinary incontinence.  Continue Detrol LA.   Gout.  Continue allopurinol.    Depression.  Continue current antidepressant.  Check CMP in 12 weeks  CPT CODE: 44010

## 2013-05-02 ENCOUNTER — Other Ambulatory Visit: Payer: Self-pay

## 2013-05-09 ENCOUNTER — Non-Acute Institutional Stay (SKILLED_NURSING_FACILITY): Payer: PRIVATE HEALTH INSURANCE | Admitting: Internal Medicine

## 2013-05-09 DIAGNOSIS — E119 Type 2 diabetes mellitus without complications: Secondary | ICD-10-CM

## 2013-05-09 DIAGNOSIS — J189 Pneumonia, unspecified organism: Secondary | ICD-10-CM | POA: Insufficient documentation

## 2013-05-09 DIAGNOSIS — I10 Essential (primary) hypertension: Secondary | ICD-10-CM

## 2013-05-09 DIAGNOSIS — E7800 Pure hypercholesterolemia, unspecified: Secondary | ICD-10-CM

## 2013-05-09 NOTE — Progress Notes (Signed)
Patient ID: [name], male   DOB: 1940-05-13, 73 y.o.   MRN: [id number]        PROGRESS NOTE  DATE:  05/09/2013   FACILITY: [facility]   LEVEL OF CARE: SNF  Routine/acute Visit  CHIEF COMPLAINT:  Manage pneumonitis, diabetes mellitus, hypertension and hyperlipidemia.   HISTORY OF PRESENT ILLNESS:  REASSESSMENT OF ONGOING PROBLEM(S):  PNEUMONITIS: New problem. On 12 -26-14 chest x-ray shows patchy pneumonitis. Patient also tested positive for influenza. He had altered mental status. He was started on ceftriaxone andTamiflu.  DM:pt's DM remains stable.  Pt denies polyuria, polydipsia, polyphagia, changes in vision or hypoglycemic episodes.  No complications noted from the medication presently being used.  Last hemoglobin A1c is: 05/2012:  Hemoglobin A1C 7.4, is 7/14 hemoglobin A1c 6.4, in 10-14 hemoglobin A1c 6.3  HTN: Pt 's HTN remains stable.  Denies CP, sob, DOE, pedal edema, headaches, dizziness or visual disturbances.  No complications from the medications currently being used.  Last BP :134/70, 156/99, 158/92, 156/82, 151/57, 145/56, 169/91, 127/61, 114/79, 111/74, 126/72.  HYPERLIPIDEMIA: No complications from the medications presently being used. Last fasting lipid panel showed : 08/2012:  Fasting lipid panel normal. in 11-14 LDL 115 otherwise fasting lipid panel normal.  PAST MEDICAL HISTORY : Reviewed.  No changes.  CURRENT MEDICATIONS: Reviewed per [facility]  REVIEW OF SYSTEMS:  Difficult to obtain due to dementia.   PHYSICAL EXAMINATION  VS:  Not available  GENERAL: no acute distress, normal body habitus EYES: Normal sclerae, normal conjunctivae, no discharge NECK: supple, trachea midline, no neck masses, no thyroid tenderness, no thyromegaly LYMPHATICS: No cervical lymphadenopathy, no supraclavicular lymphadenopathy RESPIRATORY: breathing is even & unlabored, BS diminished CARDIAC: RRR, no murmur,no extra heart sounds EDEMA/VARICOSITIES:  +1 bilateral lower  extremity edema  ARTERIAL:  pedal pulses +1  GI: abdomen soft, normal BS, no masses, no tenderness, no hepatomegaly, no splenomegaly PSYCHIATRIC: the patient is alert & disoriented, affect & behavior appropriate MUSCULOSKELETAL: Grossly moves all extremities NEUROLOGIC: Difficult to assess. Patient not following commands well  LABS/RADIOLOGY:  12-14 Tegretol 1.4, hemoglobin 11.4, MCV 100.5 otherwise CBC normal, sodium 152, chloride 118, glucose 149, creatinine 4.3 otherwise BMP normal, Tegretol 5.96  10- 14 MCV 100.2 otherwise CBC normal, glucose 137 otherwise BMP normal, Depakote level 30  8-14 Depakote level 41.4  7/14  Hb 12.5, mcv 93.6 otherwise CBC normal, glucose 156, creatinine 1.61 otherwise BMP normal, Tegretol level 5.2 6/14 creatinine 1.49 otherwise CMP normal 08/2012:  Creatinine 1.24, otherwise BMP normal,tegretol level 5.9, depakote level 30.8  Albumin 3.4, otherwise CMP normal.     Hemoglobin 12.5, MCV 92.4, otherwise CBC normal.    05/2012:  Iron panel normal.  TSH 3.954.  Vitamin B12 level 385, folate 8.6, ferritin 136.  ASSESSMENT/PLAN:        Pneumonitis-new onset significant problem. Agree with ceftriaxone.  Diabetes mellitus.  Well controlled.  Hypertension.  Request blood pressure charting  Hyperlipidemia.  LDL not at goal. Increase Lipitor to 40 mg each bedtime. Check fasting lipid panel in 8 weeks.  Influenza-treated with Tamiflu  Acute renal failure-new onset significant problem. agree with IV fluids and holding the Lasix. Recheck pending.  Hypernatremia-new onset significant problem. Agree with 1/2 normal saline. Recheck pending  Alzheimer's dementia.  Moderate advanced.   Seizure disorder.  Well controlled.    Urinary incontinence.  Continue Detrol LA.   Gout.  Continue allopurinol.    Depression.  Continue current antidepressant.  Management discussed with the [facility] nurse practitioner.  CPT CODE: 33825

## 2013-05-10 ENCOUNTER — Other Ambulatory Visit: Payer: Self-pay

## 2013-05-10 LAB — BASIC METABOLIC PANEL
ANION GAP: 2 — AB (ref 7–16)
BUN: 42 mg/dL — ABNORMAL HIGH (ref 7–18)
CHLORIDE: 126 mmol/L — AB (ref 98–107)
CO2: 27 mmol/L (ref 21–32)
Calcium, Total: 8.2 mg/dL — ABNORMAL LOW (ref 8.5–10.1)
Creatinine: 1.52 mg/dL — ABNORMAL HIGH (ref 0.60–1.30)
EGFR (Non-African Amer.): 45 — ABNORMAL LOW
GFR CALC AF AMER: 52 — AB
GLUCOSE: 58 mg/dL — AB (ref 65–99)
Osmolality: 316 (ref 275–301)
POTASSIUM: 5.9 mmol/L — AB (ref 3.5–5.1)
SODIUM: 155 mmol/L — AB (ref 136–145)

## 2013-06-20 NOTE — Progress Notes (Signed)
This encounter was created in error - please disregard.

## 2013-07-07 ENCOUNTER — Encounter: Payer: Self-pay | Admitting: *Deleted

## 2013-08-25 ENCOUNTER — Non-Acute Institutional Stay (SKILLED_NURSING_FACILITY): Payer: PRIVATE HEALTH INSURANCE | Admitting: Internal Medicine

## 2013-08-25 DIAGNOSIS — G309 Alzheimer's disease, unspecified: Secondary | ICD-10-CM

## 2013-08-25 DIAGNOSIS — E1129 Type 2 diabetes mellitus with other diabetic kidney complication: Secondary | ICD-10-CM | POA: Insufficient documentation

## 2013-08-25 DIAGNOSIS — E7800 Pure hypercholesterolemia, unspecified: Secondary | ICD-10-CM

## 2013-08-25 DIAGNOSIS — I10 Essential (primary) hypertension: Secondary | ICD-10-CM

## 2013-08-25 DIAGNOSIS — F0280 Dementia in other diseases classified elsewhere without behavioral disturbance: Secondary | ICD-10-CM

## 2013-08-25 DIAGNOSIS — E1165 Type 2 diabetes mellitus with hyperglycemia: Principal | ICD-10-CM

## 2013-08-25 NOTE — Progress Notes (Signed)
Patient ID: [name], male   DOB: [birth year], 73 y.o.   MRN: [id number]         PROGRESS NOTE  DATE:  08/25/2013   FACILITY: [facility]   LEVEL OF CARE: SNF  Routine Visit  CHIEF COMPLAINT:  Manage diabetes mellitus, hypertension and hyperlipidemia.   HISTORY OF PRESENT ILLNESS:  REASSESSMENT OF ONGOING PROBLEM(S):  DM:pt's DM remains stable.  Pt denies polyuria, polydipsia, polyphagia, changes in vision or hypoglycemic episodes.  No complications noted from the medication presently being used.  Last hemoglobin A1c is: 05/2012:  Hemoglobin A1C 7.4, is 7/14 hemoglobin A1c 6.4, in 10-14 hemoglobin A1c 6.3, in 1/15 HbA1c 7  HTN: Pt 's HTN remains stable.  Denies CP, sob, DOE, pedal edema, headaches, dizziness or visual disturbances.  No complications from the medications currently being used.  Last BP :134/70, 156/99, 158/92, 156/82, 151/57, 145/56, 169/91, 127/61, 114/79, 111/74, 126/72, 120/72.  HYPERLIPIDEMIA: No complications from the medications presently being used. Last fasting lipid panel showed : 08/2012:  Fasting lipid panel normal. in 11-14 LDL 115 otherwise fasting lipid panel normal, 3/15 FLP nl  PAST MEDICAL HISTORY : Reviewed.  No changes.  CURRENT MEDICATIONS: Reviewed per [facility]  REVIEW OF SYSTEMS:  Difficult to obtain due to dementia.   PHYSICAL EXAMINATION  VS:  See VS section  GENERAL: no acute distress, normal body habitus EYES: Normal sclerae, normal conjunctivae, no discharge NECK: supple, trachea midline, no neck masses, no thyroid tenderness, no thyromegaly LYMPHATICS: No cervical lymphadenopathy, no supraclavicular lymphadenopathy RESPIRATORY: breathing is even & unlabored, BS diminished CARDIAC: RRR, no murmur,no extra heart sounds EDEMA/VARICOSITIES:  +1 bilateral lower extremity edema  ARTERIAL:  pedal pulses +1  GI: abdomen soft, normal BS, no masses, no tenderness, no hepatomegaly, no splenomegaly PSYCHIATRIC: the patient is alert &  disoriented, affect & behavior appropriate  LABS/RADIOLOGY:  3/15 cmp nl  12-14 Tegretol 1.4, hemoglobin 11.4, MCV 100.5 otherwise CBC normal, sodium 152, chloride 118, glucose 149, creatinine 4.3 otherwise BMP normal, Tegretol 5.96  10- 14 MCV 100.2 otherwise CBC normal, glucose 137 otherwise BMP normal, Depakote level 30  8-14 Depakote level 41.4  7/14  Hb 12.5, mcv 93.6 otherwise CBC normal, glucose 156, creatinine 1.61 otherwise BMP normal, Tegretol level 5.2 6/14 creatinine 1.49 otherwise CMP normal 08/2012:  Creatinine 1.24, otherwise BMP normal,tegretol level 5.9, depakote level 30.8  Albumin 3.4, otherwise CMP normal.     Hemoglobin 12.5, MCV 92.4, otherwise CBC normal.    05/2012:  Iron panel normal.  TSH 3.954.  Vitamin B12 level 385, folate 8.6, ferritin 136.  ASSESSMENT/PLAN:   Diabetes mellitus.  Uncontrolled.  Lantus 10u qhs started  Hypertension.  Well controlled.  Hyperlipidemia.  Well controlled.  Alzheimer's dementia.  Moderate advanced.   Seizure disorder.  Well controlled.    Urinary incontinence.  Continue Detrol LA.   Gout.  Continue allopurinol.    Depression.  Continue current antidepressant.  Check depakote level  CPT CODE: [id number]  [name], [name] [phone]

## 2013-09-18 ENCOUNTER — Non-Acute Institutional Stay (SKILLED_NURSING_FACILITY): Payer: PRIVATE HEALTH INSURANCE | Admitting: Internal Medicine

## 2013-09-18 DIAGNOSIS — E1165 Type 2 diabetes mellitus with hyperglycemia: Principal | ICD-10-CM

## 2013-09-18 DIAGNOSIS — E1129 Type 2 diabetes mellitus with other diabetic kidney complication: Secondary | ICD-10-CM

## 2013-09-18 DIAGNOSIS — G309 Alzheimer's disease, unspecified: Secondary | ICD-10-CM

## 2013-09-18 DIAGNOSIS — F0280 Dementia in other diseases classified elsewhere without behavioral disturbance: Secondary | ICD-10-CM

## 2013-09-18 DIAGNOSIS — I150 Renovascular hypertension: Secondary | ICD-10-CM | POA: Insufficient documentation

## 2013-09-18 DIAGNOSIS — E7800 Pure hypercholesterolemia, unspecified: Secondary | ICD-10-CM

## 2013-09-18 NOTE — Progress Notes (Signed)
Patient ID: SKYELAR SWIGART, male   DOB: [birth year], 73 y.o.   MRN: [id number]          PROGRESS NOTE  DATE:  09/18/2013   FACILITY: [facility]   LEVEL OF CARE: SNF  Routine Visit  CHIEF COMPLAINT:  Manage diabetes mellitus, hypertension and hyperlipidemia.   HISTORY OF PRESENT ILLNESS:  REASSESSMENT OF ONGOING PROBLEM(S):  DM:pt's DM remains stable.  Pt denies polyuria, polydipsia, polyphagia, changes in vision or hypoglycemic episodes.  No complications noted from the medication presently being used.  Last hemoglobin A1c is: 05/2012:  Hemoglobin A1C 7.4, is 7/14 hemoglobin A1c 6.4, in 10-14 hemoglobin A1c 6.3, in 1/15 HbA1c 7, in 4-15 hemoglobin A1c 9  HTN: Pt 's HTN remains stable.  Denies CP, sob, DOE, pedal edema, headaches, dizziness or visual disturbances.  No complications from the medications currently being used.  Last BP :134/70, 156/99, 158/92, 156/82, 151/57, 145/56, 169/91, 127/61, 114/79, 111/74, 126/72, 120/72, 114/81.  HYPERLIPIDEMIA: No complications from the medications presently being used. Last fasting lipid panel showed : 08/2012:  Fasting lipid panel normal. in 11-14 LDL 115 otherwise fasting lipid panel normal, 3/15 FLP nl  PAST MEDICAL HISTORY : Reviewed.  No changes.  CURRENT MEDICATIONS: Reviewed per [facility]  REVIEW OF SYSTEMS:  Difficult to obtain due to dementia.   PHYSICAL EXAMINATION  VS:  See VS section  GENERAL: no acute distress, normal body habitus EYES: Normal sclerae, normal conjunctivae, no discharge NECK: supple, trachea midline, no neck masses, no thyroid tenderness, no thyromegaly LYMPHATICS: No cervical lymphadenopathy, no supraclavicular lymphadenopathy RESPIRATORY: breathing is even & unlabored, BS diminished CARDIAC: RRR, no murmur,no extra heart sounds EDEMA/VARICOSITIES:  +1 bilateral lower extremity edema  ARTERIAL:  pedal pulses +1  GI: abdomen soft, normal BS, no masses, no tenderness, no hepatomegaly, no  splenomegaly PSYCHIATRIC: the patient is alert & disoriented, affect & behavior appropriate  LABS/RADIOLOGY: 4-15 Depakote level 42.9 3/15 cmp nl  12-14 Tegretol 1.4, hemoglobin 11.4, MCV 100.5 otherwise CBC normal, sodium 152, chloride 118, glucose 149, creatinine 4.3 otherwise BMP normal, Tegretol 5.96  10- 14 MCV 100.2 otherwise CBC normal, glucose 137 otherwise BMP normal, Depakote level 30  8-14 Depakote level 41.4  7/14  Hb 12.5, mcv 93.6 otherwise CBC normal, glucose 156, creatinine 1.61 otherwise BMP normal, Tegretol level 5.2 6/14 creatinine 1.49 otherwise CMP normal 08/2012:  Creatinine 1.24, otherwise BMP normal,tegretol level 5.9, depakote level 30.8  Albumin 3.4, otherwise CMP normal.     Hemoglobin 12.5, MCV 92.4, otherwise CBC normal.    05/2012:  Iron panel normal.  TSH 3.954.  Vitamin B12 level 385, folate 8.6, ferritin 136.  ASSESSMENT/PLAN:   Diabetes mellitus.  Uncontrolled.  Lantus was increased  Hypertension.  Well controlled.  Hyperlipidemia.  Well controlled.  Alzheimer's dementia.  Moderate advanced. Depakote was decreased   Seizure disorder.  Well controlled.    Urinary incontinence.  Continue Detrol LA.   Gout.  Continue allopurinol.    Depression.  Continue current antidepressant.  CPT CODE: 03559  Kirklin Mcduffee Y. Durwin Reges, [place] [phone]

## 2013-10-06 ENCOUNTER — Encounter: Payer: Self-pay | Admitting: *Deleted

## 2013-10-09 ENCOUNTER — Non-Acute Institutional Stay (SKILLED_NURSING_FACILITY): Payer: PRIVATE HEALTH INSURANCE | Admitting: Internal Medicine

## 2013-10-09 DIAGNOSIS — I150 Renovascular hypertension: Secondary | ICD-10-CM

## 2013-10-09 DIAGNOSIS — E7800 Pure hypercholesterolemia, unspecified: Secondary | ICD-10-CM

## 2013-10-09 DIAGNOSIS — F0280 Dementia in other diseases classified elsewhere without behavioral disturbance: Secondary | ICD-10-CM

## 2013-10-09 DIAGNOSIS — E1129 Type 2 diabetes mellitus with other diabetic kidney complication: Secondary | ICD-10-CM

## 2013-10-09 DIAGNOSIS — E1165 Type 2 diabetes mellitus with hyperglycemia: Principal | ICD-10-CM

## 2013-10-09 DIAGNOSIS — G309 Alzheimer's disease, unspecified: Secondary | ICD-10-CM

## 2013-10-10 NOTE — Progress Notes (Signed)
Patient ID: [name], male   DOB: April 21, 1941, 73 y.o.   MRN: [id number]           PROGRESS NOTE  DATE:  10/09/2013   FACILITY: [facility]   LEVEL OF CARE: SNF  Routine Visit  CHIEF COMPLAINT:  Manage diabetes mellitus, hypertension and hyperlipidemia.   HISTORY OF PRESENT ILLNESS:  REASSESSMENT OF ONGOING PROBLEM(S):  DM:pt's DM remains stable.  Pt denies polyuria, polydipsia, polyphagia, changes in vision or hypoglycemic episodes.  No complications noted from the medication presently being used.  Last hemoglobin A1c is: 05/2012:  Hemoglobin A1C 7.4, is 7/14 hemoglobin A1c 6.4, in 10-14 hemoglobin A1c 6.3, in 1/15 HbA1c 7, in 4-15 hemoglobin A1c 9  HTN: Pt 's HTN remains stable.  Denies CP, sob, DOE, pedal edema, headaches, dizziness or visual disturbances.  No complications from the medications currently being used.  Last BP :134/70, 156/99, 158/92, 156/82, 151/57, 145/56, 169/91, 127/61, 114/79, 111/74, 126/72, 120/72, 114/81.  HYPERLIPIDEMIA: No complications from the medications presently being used. Last fasting lipid panel showed : 08/2012:  Fasting lipid panel normal. in 11-14 LDL 115 otherwise fasting lipid panel normal, 3/15 FLP nl  PAST MEDICAL HISTORY : Reviewed.  No changes.  CURRENT MEDICATIONS: Reviewed per [facility]  REVIEW OF SYSTEMS:  Difficult to obtain due to dementia.   PHYSICAL EXAMINATION  VS:  See VS section  GENERAL: no acute distress, normal body habitus EYES: Normal sclerae, normal conjunctivae, no discharge NECK: supple, trachea midline, no neck masses, no thyroid tenderness, no thyromegaly LYMPHATICS: No cervical lymphadenopathy, no supraclavicular lymphadenopathy RESPIRATORY: breathing is even & unlabored, BS diminished CARDIAC: RRR, no murmur,no extra heart sounds EDEMA/VARICOSITIES:  +1 bilateral lower extremity edema  ARTERIAL:  pedal pulses +1  GI: abdomen soft, normal BS, no masses, no tenderness, no hepatomegaly, no  splenomegaly PSYCHIATRIC: the patient is alert & disoriented, affect & behavior appropriate  LABS/RADIOLOGY: 4-15 Depakote level 42.9 3/15 cmp nl  12-14 Tegretol 1.4, hemoglobin 11.4, MCV 100.5 otherwise CBC normal, sodium 152, chloride 118, glucose 149, creatinine 4.3 otherwise BMP normal, Tegretol 5.96  10- 14 MCV 100.2 otherwise CBC normal, glucose 137 otherwise BMP normal, Depakote level 30  8-14 Depakote level 41.4  7/14  Hb 12.5, mcv 93.6 otherwise CBC normal, glucose 156, creatinine 1.61 otherwise BMP normal, Tegretol level 5.2 6/14 creatinine 1.49 otherwise CMP normal 08/2012:  Creatinine 1.24, otherwise BMP normal,tegretol level 5.9, depakote level 30.8  Albumin 3.4, otherwise CMP normal.     Hemoglobin 12.5, MCV 92.4, otherwise CBC normal.    05/2012:  Iron panel normal.  TSH 3.954.  Vitamin B12 level 385, folate 8.6, ferritin 136.  ASSESSMENT/PLAN:   Diabetes mellitus.  Uncontrolled.  Lantus was increased  Hypertension.  Well controlled.  Hyperlipidemia.  Well controlled.  Alzheimer's dementia.  Moderate advanced. Depakote was decreased   Seizure disorder.  Well controlled.    Gout.  Continue allopurinol.    Depression.  Continue current antidepressant.  Check CBC  CPT CODE: 16967  Edgar Frisk. Durwin Reges, [place] [phone]

## 2013-11-20 ENCOUNTER — Non-Acute Institutional Stay (SKILLED_NURSING_FACILITY): Payer: PRIVATE HEALTH INSURANCE | Admitting: Internal Medicine

## 2013-11-20 DIAGNOSIS — E1165 Type 2 diabetes mellitus with hyperglycemia: Secondary | ICD-10-CM

## 2013-11-20 DIAGNOSIS — E7800 Pure hypercholesterolemia, unspecified: Secondary | ICD-10-CM

## 2013-11-20 DIAGNOSIS — G309 Alzheimer's disease, unspecified: Secondary | ICD-10-CM

## 2013-11-20 DIAGNOSIS — E1129 Type 2 diabetes mellitus with other diabetic kidney complication: Secondary | ICD-10-CM

## 2013-11-20 DIAGNOSIS — I150 Renovascular hypertension: Secondary | ICD-10-CM

## 2013-11-20 DIAGNOSIS — F0280 Dementia in other diseases classified elsewhere without behavioral disturbance: Secondary | ICD-10-CM

## 2013-11-21 NOTE — Progress Notes (Signed)
Patient ID: [name], male   DOB: [birth year], 73 y.o.   MRN: [id number]         PROGRESS NOTE  DATE:  11/20/2013   FACILITY: [facility]   LEVEL OF CARE: SNF  Routine Visit  CHIEF COMPLAINT:  Manage diabetes mellitus, hypertension and hyperlipidemia.   HISTORY OF PRESENT ILLNESS:  REASSESSMENT OF ONGOING PROBLEM(S):  DM:pt's DM remains stable.  Pt denies polyuria, polydipsia, polyphagia, changes in vision or hypoglycemic episodes.  No complications noted from the medication presently being used.  Last hemoglobin A1c is: 05/2012:  Hemoglobin A1C 7.4, is 7/14 hemoglobin A1c 6.4, in 10-14 hemoglobin A1c 6.3, in 1/15 HbA1c 7, in 4-15 hemoglobin A1c 9  HTN: Pt 's HTN remains stable.  Denies CP, sob, DOE, pedal edema, headaches, dizziness or visual disturbances.  No complications from the medications currently being used.  Last BP :134/70, 156/99, 158/92, 156/82, 151/57, 145/56, 169/91, 127/61, 114/79, 111/74, 126/72, 120/72, 114/81 150/92, 146/72, 146/90.  HYPERLIPIDEMIA: No complications from the medications presently being used. Last fasting lipid panel showed : 08/2012:  Fasting lipid panel normal. in 11-14 LDL 115 otherwise fasting lipid panel normal, 3/15 FLP nl  PAST MEDICAL HISTORY : Reviewed.  No changes.  CURRENT MEDICATIONS: Reviewed per [facility]  REVIEW OF SYSTEMS:  Difficult to obtain due to dementia.   PHYSICAL EXAMINATION  VS:  See VS section  GENERAL: no acute distress, normal body habitus EYES: Normal sclerae, normal conjunctivae, no discharge NECK: supple, trachea midline, no neck masses, no thyroid tenderness, no thyromegaly LYMPHATICS: No cervical lymphadenopathy, no supraclavicular lymphadenopathy RESPIRATORY: breathing is even & unlabored, BS diminished CARDIAC: RRR, no murmur,no extra heart sounds EDEMA/VARICOSITIES:  +1 bilateral lower extremity edema  ARTERIAL:  pedal pulses +1  GI: abdomen soft, normal BS, no masses, no tenderness, no hepatomegaly, no  splenomegaly PSYCHIATRIC: the patient is alert & disoriented, affect & behavior appropriate  LABS/RADIOLOGY: 6-15 hemoglobin 12.9, MCV 95.3 other lites CBC normal 4-15 Depakote level 42.9 3/15 cmp nl  12-14 Tegretol 1.4, hemoglobin 11.4, MCV 100.5 otherwise CBC normal, sodium 152, chloride 118, glucose 149, creatinine 4.3 otherwise BMP normal, Tegretol 5.96  10- 14 MCV 100.2 otherwise CBC normal, glucose 137 otherwise BMP normal, Depakote level 30  8-14 Depakote level 41.4  7/14  Hb 12.5, mcv 93.6 otherwise CBC normal, glucose 156, creatinine 1.61 otherwise BMP normal, Tegretol level 5.2 6/14 creatinine 1.49 otherwise CMP normal 08/2012:  Creatinine 1.24, otherwise BMP normal,tegretol level 5.9, depakote level 30.8  Albumin 3.4, otherwise CMP normal.     Hemoglobin 12.5, MCV 92.4, otherwise CBC normal.    05/2012:  Iron panel normal.  TSH 3.954.  Vitamin B12 level 385, folate 8.6, ferritin 136.  ASSESSMENT/PLAN:   Diabetes mellitus.  Uncontrolled.  Lantus was increased. Check hemoglobin A1c.  Hypertension. uncontrolled. Start HCTZ 25 mg daily  Hyperlipidemia.  Well controlled.  Alzheimer's dementia.  Moderate advanced.   Seizure disorder.  Well controlled.    Gout.  Continue allopurinol.    Depression.  Continue current antidepressant.  Check BMP on 11-24-13  CPT CODE: 61607  Edgar Frisk. Durwin Reges, [place] [phone]

## 2014-01-01 ENCOUNTER — Non-Acute Institutional Stay (SKILLED_NURSING_FACILITY): Payer: PRIVATE HEALTH INSURANCE | Admitting: Internal Medicine

## 2014-01-01 DIAGNOSIS — E1129 Type 2 diabetes mellitus with other diabetic kidney complication: Secondary | ICD-10-CM

## 2014-01-01 DIAGNOSIS — G309 Alzheimer's disease, unspecified: Secondary | ICD-10-CM

## 2014-01-01 DIAGNOSIS — F0280 Dementia in other diseases classified elsewhere without behavioral disturbance: Secondary | ICD-10-CM

## 2014-01-01 DIAGNOSIS — E7800 Pure hypercholesterolemia, unspecified: Secondary | ICD-10-CM

## 2014-01-01 DIAGNOSIS — E1165 Type 2 diabetes mellitus with hyperglycemia: Principal | ICD-10-CM

## 2014-01-01 DIAGNOSIS — I150 Renovascular hypertension: Secondary | ICD-10-CM

## 2014-01-05 LAB — BASIC METABOLIC PANEL
BUN: 27 mg/dL — AB (ref 4–21)
Creatinine: 1.8 mg/dL — AB (ref 0.6–1.3)
GLUCOSE: 139 mg/dL
POTASSIUM: 4.1 mmol/L (ref 3.4–5.3)
SODIUM: 138 mmol/L (ref 137–147)

## 2014-01-05 LAB — CBC AND DIFFERENTIAL
HEMATOCRIT: 40 % — AB (ref 41–53)
Hemoglobin: 12.7 g/dL — AB (ref 13.5–17.5)
Platelets: 267 10*3/uL (ref 150–399)
WBC: 6.6 10^3/mL

## 2014-01-05 NOTE — Progress Notes (Signed)
Patient ID: [name], male   DOB: 26-Apr-1941, 73 y.o.   MRN: [id number]         PROGRESS NOTE  DATE:  01/01/2014   FACILITY: [facility]   LEVEL OF CARE: SNF  Routine Visit  CHIEF COMPLAINT:  Manage diabetes mellitus, hypertension and hyperlipidemia.   HISTORY OF PRESENT ILLNESS:  REASSESSMENT OF ONGOING PROBLEM(S):  DM:pt's DM remains stable.  Pt denies polyuria, polydipsia, polyphagia, changes in vision or hypoglycemic episodes.  No complications noted from the medication presently being used.  Last hemoglobin A1c is: 05/2012:  Hemoglobin A1C 7.4, is 7/14 hemoglobin A1c 6.4, in 10-14 hemoglobin A1c 6.3, in 1/15 HbA1c 7, in 4-15 hemoglobin A1c 9, in 7-15 hemoglobin A1c 9.9  HTN: Pt 's HTN remains stable.  Denies CP, sob, DOE, pedal edema, headaches, dizziness or visual disturbances.  No complications from the medications currently being used.  Last BP :134/70, 156/99, 158/92, 156/82, 151/57, 145/56, 169/91, 127/61, 114/79, 111/74, 126/72, 120/72, 114/81 150/92, 146/72, 146/90, 139/80.  HYPERLIPIDEMIA: No complications from the medications presently being used. Last fasting lipid panel showed : 08/2012:  Fasting lipid panel normal. in 11-14 LDL 115 otherwise fasting lipid panel normal, 3/15 FLP nl  PAST MEDICAL HISTORY : Reviewed.  No changes.  CURRENT MEDICATIONS: Reviewed per [facility]  REVIEW OF SYSTEMS:  Difficult to obtain due to dementia.   PHYSICAL EXAMINATION  VS:  See VS section  GENERAL: no acute distress, normal body habitus EYES: Normal sclerae, normal conjunctivae, no discharge NECK: supple, trachea midline, no neck masses, no thyroid tenderness, no thyromegaly LYMPHATICS: No cervical lymphadenopathy, no supraclavicular lymphadenopathy RESPIRATORY: breathing is even & unlabored, BS diminished CARDIAC: RRR, no murmur,no extra heart sounds EDEMA/VARICOSITIES:  +1 bilateral lower extremity edema  ARTERIAL:  pedal pulses +1  GI: abdomen soft, normal BS, no masses,  no tenderness, no hepatomegaly, no splenomegaly PSYCHIATRIC: the patient is alert & disoriented, affect & behavior appropriate  LABS/RADIOLOGY: 7-15 glucose 184, creatinine 1.8 otherwise BMP normal 6-15 hemoglobin 12.9, MCV 95.3 other lites CBC normal 4-15 Depakote level 42.9 3/15 cmp nl  12-14 Tegretol 1.4, hemoglobin 11.4, MCV 100.5 otherwise CBC normal, sodium 152, chloride 118, glucose 149, creatinine 4.3 otherwise BMP normal, Tegretol 5.96  10- 14 MCV 100.2 otherwise CBC normal, glucose 137 otherwise BMP normal, Depakote level 30  8-14 Depakote level 41.4  7/14  Hb 12.5, mcv 93.6 otherwise CBC normal, glucose 156, creatinine 1.61 otherwise BMP normal, Tegretol level 5.2 6/14 creatinine 1.49 otherwise CMP normal 08/2012:  Creatinine 1.24, otherwise BMP normal,tegretol level 5.9, depakote level 30.8  Albumin 3.4, otherwise CMP normal.     Hemoglobin 12.5, MCV 92.4, otherwise CBC normal.    05/2012:  Iron panel normal.  TSH 3.954.  Vitamin B12 level 385, folate 8.6, ferritin 136.  ASSESSMENT/PLAN:   Diabetes mellitus.  Uncontrolled.  Lantus was increased.   Hypertension. uncontrolled.   Hyperlipidemia.  Well controlled.  Alzheimer's dementia.  Moderate advanced. Depakote was decreased  Seizure disorder.  Well controlled.    Gout.  Continue allopurinol.    Depression.  Continue current antidepressant.  CPT CODE: 92010  Jla Reynolds Y. Durwin Reges, [place] [phone]

## 2014-01-20 ENCOUNTER — Non-Acute Institutional Stay (SKILLED_NURSING_FACILITY): Payer: PRIVATE HEALTH INSURANCE | Admitting: Internal Medicine

## 2014-01-20 DIAGNOSIS — F0280 Dementia in other diseases classified elsewhere without behavioral disturbance: Secondary | ICD-10-CM

## 2014-01-20 DIAGNOSIS — E7800 Pure hypercholesterolemia, unspecified: Secondary | ICD-10-CM

## 2014-01-20 DIAGNOSIS — E1165 Type 2 diabetes mellitus with hyperglycemia: Principal | ICD-10-CM

## 2014-01-20 DIAGNOSIS — G309 Alzheimer's disease, unspecified: Secondary | ICD-10-CM

## 2014-01-20 DIAGNOSIS — I150 Renovascular hypertension: Secondary | ICD-10-CM

## 2014-01-20 DIAGNOSIS — E1129 Type 2 diabetes mellitus with other diabetic kidney complication: Secondary | ICD-10-CM

## 2014-01-20 NOTE — Progress Notes (Signed)
Patient ID: [name], male   DOB: [birth year], 73 y.o.   MRN: [id number]         PROGRESS NOTE  DATE:  01/20/2014   FACILITY: [facility]   LEVEL OF CARE: SNF  Routine Visit  CHIEF COMPLAINT:  Manage diabetes mellitus, hypertension and hyperlipidemia.   HISTORY OF PRESENT ILLNESS:  REASSESSMENT OF ONGOING PROBLEM(S):  DM:pt's DM remains stable.  Pt denies polyuria, polydipsia, polyphagia, changes in vision or hypoglycemic episodes.  No complications noted from the medication presently being used.  Last hemoglobin A1c is: 05/2012:  Hemoglobin A1C 7.4, is 7/14 hemoglobin A1c 6.4, in 10-14 hemoglobin A1c 6.3, in 1/15 HbA1c 7, in 4-15 hemoglobin A1c 9, in 7-15 hemoglobin A1c 9.9  HTN: Pt 's HTN remains stable.  Denies CP, sob, DOE, pedal edema, headaches, dizziness or visual disturbances.  No complications from the medications currently being used.  Last BP :134/70, 156/99, 158/92, 156/82, 151/57, 145/56, 169/91, 127/61, 114/79, 111/74, 126/72, 120/72, 114/81 150/92, 146/72, 146/90, 139/80, 134/62.  HYPERLIPIDEMIA: No complications from the medications presently being used. Last fasting lipid panel showed : 08/2012:  Fasting lipid panel normal. in 11-14 LDL 115 otherwise fasting lipid panel normal, 3/15 FLP nl, in 8-15 otherwise fasting lipid panel normal  PAST MEDICAL HISTORY : Reviewed.  No changes.  CURRENT MEDICATIONS: Reviewed per [facility]  REVIEW OF SYSTEMS:  Difficult to obtain due to dementia.   PHYSICAL EXAMINATION  VS:  See VS section  GENERAL: no acute distress, normal body habitus EYES: Normal sclerae, normal conjunctivae, no discharge NECK: supple, trachea midline, no neck masses, no thyroid tenderness, no thyromegaly LYMPHATICS: No cervical lymphadenopathy, no supraclavicular lymphadenopathy RESPIRATORY: breathing is even & unlabored, BS diminished CARDIAC: RRR, no murmur,no extra heart sounds EDEMA/VARICOSITIES:  +1 bilateral lower extremity edema  ARTERIAL:   pedal pulses +1  GI: abdomen soft, normal BS, no masses, no tenderness, no hepatomegaly, no splenomegaly PSYCHIATRIC: the patient is alert & disoriented, affect & behavior appropriate  LABS/RADIOLOGY: 8-15 hemoglobin 12.7, MCV 95.7 otherwise CBC normal, CO2 33, glucose 139, BUN 27, creatinine 1.8 otherwise BMP normal 7-15 glucose 184, creatinine 1.8 otherwise BMP normal 6-15 hemoglobin 12.9, MCV 95.3 other lites CBC normal 4-15 Depakote level 42.9 3/15 cmp nl  12-14 Tegretol 1.4, hemoglobin 11.4, MCV 100.5 otherwise CBC normal, sodium 152, chloride 118, glucose 149, creatinine 4.3 otherwise BMP normal, Tegretol 5.96  10- 14 MCV 100.2 otherwise CBC normal, glucose 137 otherwise BMP normal, Depakote level 30  8-14 Depakote level 41.4  7/14  Hb 12.5, mcv 93.6 otherwise CBC normal, glucose 156, creatinine 1.61 otherwise BMP normal, Tegretol level 5.2 6/14 creatinine 1.49 otherwise CMP normal 08/2012:  Creatinine 1.24, otherwise BMP normal,tegretol level 5.9, depakote level 30.8  Albumin 3.4, otherwise CMP normal.     Hemoglobin 12.5, MCV 92.4, otherwise CBC normal.    05/2012:  Iron panel normal.  TSH 3.954.  Vitamin B12 level 385, folate 8.6, ferritin 136.  ASSESSMENT/PLAN:   Diabetes mellitus.  Uncontrolled.  Lantus was increased.   Hypertension. Well controlled.   Hyperlipidemia.  Well controlled.  Alzheimer's dementia.  Moderate advanced. Depakote was decreased  Seizure disorder.  Well controlled.    Gout.  Continue allopurinol.    Depression.  Continue current antidepressant.  Constipation-continue laxatives  CPT CODE: 77824  Will Schier Y. Durwin Reges, [place] [phone]

## 2014-01-21 LAB — LIPID PANEL
Cholesterol: 167 mg/dL (ref 0–200)
HDL: 50 mg/dL (ref 35–70)
LDL CALC: 100 mg/dL
Triglycerides: 86 mg/dL (ref 40–160)

## 2014-02-12 ENCOUNTER — Non-Acute Institutional Stay (SKILLED_NURSING_FACILITY): Payer: PRIVATE HEALTH INSURANCE | Admitting: Internal Medicine

## 2014-02-12 DIAGNOSIS — E7800 Pure hypercholesterolemia, unspecified: Secondary | ICD-10-CM

## 2014-02-12 DIAGNOSIS — F0280 Dementia in other diseases classified elsewhere without behavioral disturbance: Secondary | ICD-10-CM

## 2014-02-12 DIAGNOSIS — E119 Type 2 diabetes mellitus without complications: Secondary | ICD-10-CM

## 2014-02-12 DIAGNOSIS — G309 Alzheimer's disease, unspecified: Secondary | ICD-10-CM

## 2014-02-12 DIAGNOSIS — I10 Essential (primary) hypertension: Secondary | ICD-10-CM

## 2014-02-14 NOTE — Progress Notes (Signed)
Patient ID: [name], male   DOB: Apr 04, 1941, 73 y.o.   MRN: [id number]         PROGRESS NOTE  DATE:  02/12/2014   FACILITY: [facility]   LEVEL OF CARE: SNF  Routine Visit  CHIEF COMPLAINT:  Manage diabetes mellitus, hypertension and hyperlipidemia.   HISTORY OF PRESENT ILLNESS:  REASSESSMENT OF ONGOING PROBLEM(S):  DM:pt's DM remains stable.  Pt denies polyuria, polydipsia, polyphagia, changes in vision or hypoglycemic episodes.  No complications noted from the medication presently being used.  Last hemoglobin A1c is: 05/2012:  Hemoglobin A1C 7.4, is 7/14 hemoglobin A1c 6.4, in 10-14 hemoglobin A1c 6.3, in 1/15 HbA1c 7, in 4-15 hemoglobin A1c 9, in 7-15 hemoglobin A1c 9.9  HTN: Pt 's HTN remains stable.  Denies CP, sob, DOE, pedal edema, headaches, dizziness or visual disturbances.  No complications from the medications currently being used.  Last BP :134/70, 156/99, 158/92, 156/82, 151/57, 145/56, 169/91, 127/61, 114/79, 111/74, 126/72, 120/72, 114/81 150/92, 146/72, 146/90, 139/80, 134/62.  HYPERLIPIDEMIA: No complications from the medications presently being used. Last fasting lipid panel showed : 08/2012:  Fasting lipid panel normal. in 11-14 LDL 115 otherwise fasting lipid panel normal, 3/15 FLP nl, in 8-15 otherwise fasting lipid panel normal  PAST MEDICAL HISTORY : Reviewed.  No changes.  CURRENT MEDICATIONS: Reviewed per [facility]  REVIEW OF SYSTEMS:  Difficult to obtain due to dementia.   PHYSICAL EXAMINATION  VS:  See VS section  GENERAL: no acute distress, normal body habitus EYES: Normal sclerae, normal conjunctivae, no discharge NECK: supple, trachea midline, no neck masses, no thyroid tenderness, no thyromegaly LYMPHATICS: No cervical lymphadenopathy, no supraclavicular lymphadenopathy RESPIRATORY: breathing is even & unlabored, BS diminished CARDIAC: RRR, no murmur,no extra heart sounds EDEMA/VARICOSITIES:  +1 bilateral lower extremity edema  ARTERIAL:   pedal pulses +1  GI: abdomen soft, normal BS, no masses, no tenderness, no hepatomegaly, no splenomegaly PSYCHIATRIC: the patient is alert & disoriented, affect & behavior appropriate  LABS/RADIOLOGY: 8-15 hemoglobin 12.7, MCV 95.7 otherwise CBC normal, CO2 33, glucose 139, BUN 27, creatinine 1.8 otherwise BMP normal 7-15 glucose 184, creatinine 1.8 otherwise BMP normal 6-15 hemoglobin 12.9, MCV 95.3 other lites CBC normal 4-15 Depakote level 42.9 3/15 cmp nl  12-14 Tegretol 1.4, hemoglobin 11.4, MCV 100.5 otherwise CBC normal, sodium 152, chloride 118, glucose 149, creatinine 4.3 otherwise BMP normal, Tegretol 5.96  10- 14 MCV 100.2 otherwise CBC normal, glucose 137 otherwise BMP normal, Depakote level 30  8-14 Depakote level 41.4  7/14  Hb 12.5, mcv 93.6 otherwise CBC normal, glucose 156, creatinine 1.61 otherwise BMP normal, Tegretol level 5.2 6/14 creatinine 1.49 otherwise CMP normal 08/2012:  Creatinine 1.24, otherwise BMP normal,tegretol level 5.9, depakote level 30.8  Albumin 3.4, otherwise CMP normal.     Hemoglobin 12.5, MCV 92.4, otherwise CBC normal.    05/2012:  Iron panel normal.  TSH 3.954.  Vitamin B12 level 385, folate 8.6, ferritin 136.  ASSESSMENT/PLAN:   Diabetes mellitus.  Uncontrolled.  Lantus was increased.   Hypertension. Well controlled.   Hyperlipidemia.  Well controlled.  Alzheimer's dementia.  Moderate advanced.   Seizure disorder.  Well controlled.      Depression.  Continue current antidepressant.  Constipation-continue laxatives  Check liver profile  CPT CODE: [id number]  Edgar Frisk. Durwin Reges, [place] [phone]

## 2014-02-23 ENCOUNTER — Other Ambulatory Visit: Payer: Self-pay | Admitting: *Deleted

## 2014-02-23 MED ORDER — LORAZEPAM 0.5 MG PO TABS: ORAL_TABLET | ORAL | Status: DC

## 2014-02-23 NOTE — Telephone Encounter (Signed)
[facility] 

## 2014-02-28 LAB — HEMOGLOBIN A1C: Hgb A1c MFr Bld: 9.6 % — AB (ref 4.0–6.0)

## 2014-03-16 ENCOUNTER — Non-Acute Institutional Stay (SKILLED_NURSING_FACILITY): Payer: PRIVATE HEALTH INSURANCE | Admitting: Internal Medicine

## 2014-03-16 ENCOUNTER — Encounter: Payer: Self-pay | Admitting: Internal Medicine

## 2014-03-16 DIAGNOSIS — H409 Unspecified glaucoma: Secondary | ICD-10-CM

## 2014-03-16 DIAGNOSIS — E785 Hyperlipidemia, unspecified: Secondary | ICD-10-CM

## 2014-03-16 DIAGNOSIS — F32A Depression, unspecified: Secondary | ICD-10-CM

## 2014-03-16 DIAGNOSIS — F329 Major depressive disorder, single episode, unspecified: Secondary | ICD-10-CM

## 2014-03-16 DIAGNOSIS — E1121 Type 2 diabetes mellitus with diabetic nephropathy: Secondary | ICD-10-CM

## 2014-03-16 DIAGNOSIS — F0280 Dementia in other diseases classified elsewhere without behavioral disturbance: Secondary | ICD-10-CM

## 2014-03-16 DIAGNOSIS — G309 Alzheimer's disease, unspecified: Secondary | ICD-10-CM

## 2014-03-16 DIAGNOSIS — I10 Essential (primary) hypertension: Secondary | ICD-10-CM

## 2014-03-16 NOTE — Progress Notes (Signed)
Patient ID: [name], male   DOB: Jan 01, 1941, 73 y.o.   MRN: [id number]   Place of Service: [facility]  No Known Allergies  Code Status: DNR  Goals of Care: Comfort and Quality of Life/ Long term care  Chief Complaint  Patient presents with  . Medical Management of Chronic Issues    DM2, HLD, AD, depression, glaucoma    HPI 73 y.o. male with PMH of AD, DM2, HLD, depression, glaucoma among others is being seen for a routine visit. No recent falls or skin issues reported. Weight stable. No change in behaviors or functional status. No concerns from staff. No complaints verbalized by patient.   Review of Systems Constitutional: Negative for fever and chills HENT: Negative for ear pain, congestion, and sore throat Eyes: Negative for eye pain and eye discharge Cardiovascular: Negative for chest pain and leg swelling Respiratory: Negative cough, shortness of breath, and wheezing.  Gastrointestinal: Negative for nausea and vomiting. Negative for diarrhea and constipation  Musculoskeletal: Negative for back pain and joint pain Neurological: Negative for dizziness and headache  Skin: Negative for rash and wound.   Psychiatric: Negative for depression   Past Medical History  Diagnosis Date  . Colon cancer   . Dementia   . CHF (congestive heart failure)   . Hypertension   . Hyperlipemia   . Diabetes mellitus   . History of CVA (cerebrovascular accident)   . History of colectomy   . Mild mental retardation   . Gout   . Alzheimer disease   . Depression   . Glaucoma   . Substance abuse     alcoholism    Past Surgical History  Procedure Laterality Date  . Colectomy  08/2008    History   Social History  . Marital Status: Single    Spouse Name: N/A    Number of Children: N/A  . Years of Education: N/A   Occupational History  . Not on file.   Social History Main Topics  . Smoking status: Former Smoker -- 2.00 packs/day    Quit date:  07/29/1991  . Smokeless tobacco: Never Used  . Alcohol Use: No  . Drug Use: No  . Sexual Activity: No   Other Topics Concern  . Not on file   Social History Narrative      Medication List       This list is accurate as of: 03/16/14 11:21 AM.  Always use your most recent med list.               acetaminophen 325 MG tablet  Commonly known as:  TYLENOL  Take 650 mg by mouth every 4 (four) hours as needed. For temp> 100     amLODipine 10 MG tablet  Commonly known as:  NORVASC  Take 10 mg by mouth every evening. Hold for SBP<110     aspirin 81 MG tablet  Take 81 mg by mouth daily. For CVA     atenolol 100 MG tablet  Commonly known as:  TENORMIN  Take 100 mg by mouth daily. Hold for SBP less than 110 and pulse less than 50     atorvastatin 20 MG tablet  Commonly known as:  LIPITOR  Take 40 mg by mouth at bedtime.     betaxolol 0.25 % ophthalmic suspension  Commonly known as:  BETOPTIC-S  Place 1 drop into both eyes 2 (two) times daily.     cloNIDine 0.3 MG tablet  Commonly  known as:  CATAPRES  Take 0.3 mg by mouth 3 (three) times daily. For HTN     Cranberry 475 MG Caps  Take 475 mg by mouth 2 (two) times daily. For chronic UTI     donepezil 10 MG tablet  Commonly known as:  ARICEPT  Take 10 mg by mouth at bedtime. For dementia     FLUoxetine 20 MG tablet  Commonly known as:  PROZAC  Take 20 mg by mouth daily. For depression     hydrALAZINE 100 MG tablet  Commonly known as:  APRESOLINE  Take 100 mg by mouth every 8 (eight) hours. For HTN     hydrochlorothiazide 25 MG tablet  Commonly known as:  HYDRODIURIL  Take 25 mg by mouth daily.     insulin aspart 100 UNIT/ML injection  Commonly known as:  novoLOG  Inject 3 Units into the skin 3 (three) times daily with meals. For CBGs > 200     insulin glargine 100 UNIT/ML injection  Commonly known as:  LANTUS  Inject 30-35 Units into the skin 2 (two) times daily. 30 units SQ qam and 35 units SQ QHS      ipratropium 0.02 % nebulizer solution  Commonly known as:  ATROVENT  Take 2.5 mLs (0.5 mg total) by nebulization every 6 (six) hours.     LORazepam 0.5 MG tablet  Commonly known as:  ATIVAN  Take one tablet by mouth every 8 hours as needed for agitation     mirabegron ER 25 MG Tb24 tablet  Commonly known as:  MYRBETRIQ  Take 25 mg by mouth daily. For BPH     NAMENDA XR 28 MG Cp24  Generic drug:  Memantine HCl ER  Take 28 mg by mouth daily.     polyethylene glycol packet  Commonly known as:  MIRALAX / GLYCOLAX  Take 17 g by mouth daily. Mix in 6-8 oz of liquids daily. For constipation     travoprost (benzalkonium) 0.004 % ophthalmic solution  Commonly known as:  TRAVATAN  Place 1 drop into both eyes at bedtime. Wait 3- 5 minutes between eye medications.     Vitamin D3 2000 UNITS Tabs  Take 2,000 Units by mouth 2 (two) times daily. For osteoporosis        Physical Exam Filed Vitals:   03/16/14 1101  BP: 141/84  Pulse: 62  Temp: 96.8 F (36 C)  Resp: 18   Constitutional: WDWN elderly male in no acute distress. Conversant and pleasant.  HEENT: Normocephalic and atraumatic. PERRL. EOM intact. No icterus. Oral mucosa moist. Posterior pharynx clear of any exudate or lesions. Poor dentition.  Neck: Supple and nontender. No lymphadenopathy, masses, or thyromegaly. No JVD or carotid bruits. Cardiac: Normal S1, S2. RRR without appreciable murmurs, rubs, or gallops. Distal pulses intact. Trace pitting bilateral leg edema.  Lungs: No respiratory distress. Breath sounds clear bilaterally without rales, rhonchi, or wheezes. Abdomen: Audible bowel sounds in all quadrants. Soft, nontender, nondistended. No palpable mass.  Musculoskeletal: Able to move all extremities. No joint erythema or tenderness. Skin: Warm and dry. No rash noted. No erythema.  Neurological: Alert and oriented to person Psychiatric: Appropriate mood and affect. Has dementia at baseline  Labs Reviewed CBC Latest  Ref Rng 01/05/2014 11/23/2012 11/22/2012  WBC - 6.6 6.8 8.3  Hemoglobin 13.5 - 17.5 g/dL 12.7(A) 12.3(L) 13.6  Hematocrit 41 - 53 % 40(A) 36.3(L) 39.6  Platelets 150 - 399 K/L 267 237 237    CMP  Component Value Date/Time   NA 138 01/05/2014   NA 141 11/23/2012 0530   K 4.1 01/05/2014   CL 106 11/23/2012 0530   CO2 25 11/23/2012 0530   GLUCOSE 83 11/23/2012 0530   BUN 27* 01/05/2014   BUN 19 11/23/2012 0530   CREATININE 1.8* 01/05/2014   CREATININE 1.45* 11/23/2012 0530   CALCIUM 8.1* 11/23/2012 0530   PROT 7.8 11/22/2012 1229   ALBUMIN 3.0* 11/22/2012 1229   AST 18 11/22/2012 1229   ALT 17 11/22/2012 1229   ALKPHOS 79 11/22/2012 1229   BILITOT 0.2* 11/22/2012 1229   GFRNONAA 47* 11/23/2012 0530   GFRAA 54* 11/23/2012 0530    Lipid Panel     Component Value Date/Time   CHOL 167 01/21/2014   TRIG 86 01/21/2014   HDL 50 01/21/2014   CHOLHDL 4.4 08/03/2008 0440   VLDL 12 08/03/2008 0440   LDLCALC 100 01/21/2014    Lab Results  Component Value Date   HGBA1C 9.6* 02/28/2014    Assessment & Plan 1. Type 2 diabetes mellitus without complication Uncontrolled. Last a1c 9.6 on 02/28/14. CBGs range 223-343. Lantus was recently increased (03/13/14) from 20 units Qam to 30 units Qam and 30 units QHS to 35 units QHS. Mealtime insulin was discontinued and Humalog 3 units TID with meals for CBGs >200 was initiated. Will continue the same regimen for now and monitor. Goal a1c 7 for him. Continue low dose asa. Continue statin for lipid. Currently not on ACEI/ARB secondary to CKD stage 3. Eye and foot exam are up to date.   2. Alzheimer's disease Persists with decline anticipated. Will continue aricept 10mg  daily and switch namenda 5mg  twice daily to namenda xr 28mg  daily. Continue to monitor for change in behaviors. Continue fall risk and pressure ulcer precautions.   3. Depression Stable. Continue prozac 20mg  daily and monitor for change in mood  4. HLD  (hyperlipidemia) Stable. LDL 100 on 01/21/14. Continue lipitor 40mg  daily and monitor.   5. Glaucoma Stable. Last eye exam 02/24/14. Continue travatan 0.004% drop in each eye daily at bedtime and betoptic-s drop in each eye twice daily. Continue to monitor.   6. HTN Stable. BP readings in 130s/70s. Continue hydralazine 100mg  Q8H , hctz 25mg  daily, atenolol 100mg  daily, amlodipine 10mg  daily, and clonidine 0.3mg  three times daily. Continue to monitor.   Labs Ordered: urine microalbumin/creatinine ratio.   Family/Staff Communication Plan of care discuss with nursing staff. Nursing staff verbalize understanding and agree with plan of care. No additional questions or concerns reported.    Arthur Holms, MSN, AGNP-C [place], [place] [address] [phone] [8am-5pm] After hours: [phone]  I have personally reviewed this note and agree with the care plan  [facility], MD  [facility] [phone] (Monday-Friday 8 am - 5 pm) [phone] (afterhours)

## 2014-03-19 LAB — BASIC METABOLIC PANEL
BUN: 25 mg/dL — AB (ref 4–21)
CREATININE: 1.6 mg/dL — AB (ref <=1.3)

## 2014-05-14 ENCOUNTER — Non-Acute Institutional Stay (SKILLED_NURSING_FACILITY): Payer: Medicare Other | Admitting: Internal Medicine

## 2014-05-14 DIAGNOSIS — R32 Unspecified urinary incontinence: Secondary | ICD-10-CM

## 2014-05-14 DIAGNOSIS — F22 Delusional disorders: Secondary | ICD-10-CM

## 2014-05-14 DIAGNOSIS — F03918 Unspecified dementia, unspecified severity, with other behavioral disturbance: Secondary | ICD-10-CM

## 2014-05-14 DIAGNOSIS — F0390 Unspecified dementia without behavioral disturbance: Secondary | ICD-10-CM | POA: Insufficient documentation

## 2014-05-14 DIAGNOSIS — F0391 Unspecified dementia with behavioral disturbance: Secondary | ICD-10-CM

## 2014-05-14 DIAGNOSIS — F0392 Unspecified dementia, unspecified severity, with psychotic disturbance: Secondary | ICD-10-CM | POA: Insufficient documentation

## 2014-05-14 DIAGNOSIS — E1122 Type 2 diabetes mellitus with diabetic chronic kidney disease: Secondary | ICD-10-CM

## 2014-05-14 DIAGNOSIS — N189 Chronic kidney disease, unspecified: Secondary | ICD-10-CM

## 2014-05-14 DIAGNOSIS — N183 Chronic kidney disease, stage 3 (moderate): Secondary | ICD-10-CM

## 2014-05-14 NOTE — Progress Notes (Signed)
Patient ID: [name], male   DOB: April 06, 1941, 74 y.o.   MRN: [id number]    [facility]  Chief complaint- medical management of chronic issues  Allergies- NKDA  Code status: DNR  HPI 74 y.o. male patient is seen for routine visit. He denies any concerns. No new concern from staff. No falls reported. No new skin concerns. No new behavior changes. He has PMH of DM2, HLD, depression, glaucoma and dementia.   ROS Constitutional: Negative for fever and chills HENT: Negative for congestion, and sore throat Eyes: Negative for eye pain and eye discharge Cardiovascular: Negative for chest pain and leg swelling Respiratory: Negative cough, shortness of breath, and wheezing.   Gastrointestinal: Negative for nausea and vomiting. Negative for diarrhea and constipation  Musculoskeletal: Negative for back pain and joint pain Neurological: Negative for dizziness and headache. Has dysarthria Skin: Negative for rash and wound.   Psychiatric: Negative for depression   Past Medical History  Diagnosis Date  . Colon cancer   . Dementia   . CHF (congestive heart failure)   . Hypertension   . Hyperlipemia   . Diabetes mellitus   . History of CVA (cerebrovascular accident)   . History of colectomy   . Mild mental retardation   . Gout   . Alzheimer disease   . Depression   . Glaucoma   . Substance abuse     alcoholism     Medication List       This list is accurate as of: 05/14/14  5:18 PM.  Always use your most recent med list.               acetaminophen 325 MG tablet  Commonly known as:  TYLENOL  Take 650 mg by mouth every 4 (four) hours as needed. For temp> 100     amLODipine 10 MG tablet  Commonly known as:  NORVASC  Take 10 mg by mouth every evening. Hold for SBP<110     aspirin 81 MG tablet  Take 81 mg by mouth daily. For CVA     atenolol 100 MG tablet  Commonly known as:  TENORMIN  Take 100 mg by mouth daily. Hold for  SBP less than 110 and pulse less than 50     atorvastatin 20 MG tablet  Commonly known as:  LIPITOR  Take 40 mg by mouth at bedtime.     cloNIDine 0.3 MG tablet  Commonly known as:  CATAPRES  Take 0.3 mg by mouth 3 (three) times daily. For HTN     Cranberry 475 MG Caps  Take 475 mg by mouth 2 (two) times daily. For chronic UTI     divalproex 125 MG DR tablet  Commonly known as:  DEPAKOTE  Take 125 mg by mouth at bedtime.     donepezil 10 MG tablet  Commonly known as:  ARICEPT  Take 10 mg by mouth at bedtime. For dementia     FLUoxetine 20 MG tablet  Commonly known as:  PROZAC  Take 20 mg by mouth daily. For depression     hydrALAZINE 100 MG tablet  Commonly known as:  APRESOLINE  Take 100 mg by mouth every 8 (eight) hours. For HTN     hydrochlorothiazide 25 MG tablet  Commonly known as:  HYDRODIURIL  Take 25 mg by mouth daily.     insulin aspart 100 UNIT/ML injection  Commonly known as:  novoLOG  Inject 3 Units into the skin  3 (three) times daily with meals. For CBGs > 200     insulin glargine 100 UNIT/ML injection  Commonly known as:  LANTUS  Inject 40-45 Units into the skin 2 (two) times daily. 30 units SQ qam and 35 units SQ QHS     ipratropium 0.02 % nebulizer solution  Commonly known as:  ATROVENT  Take 2.5 mLs (0.5 mg total) by nebulization every 6 (six) hours.     LORazepam 0.5 MG tablet  Commonly known as:  ATIVAN  Take one tablet by mouth every 8 hours as needed for agitation     mirabegron ER 25 MG Tb24 tablet  Commonly known as:  MYRBETRIQ  Take 25 mg by mouth daily. For BPH     NAMENDA XR 28 MG Cp24 24 hr capsule  Generic drug:  memantine  Take 28 mg by mouth daily.     polyethylene glycol packet  Commonly known as:  MIRALAX / GLYCOLAX  Take 17 g by mouth daily. Mix in 6-8 oz of liquids daily. For constipation     travoprost (benzalkonium) 0.004 % ophthalmic solution  Commonly known as:  TRAVATAN  Place 1 drop into both eyes at bedtime. Wait  3- 5 minutes between eye medications.     Vitamin D3 2000 UNITS Tabs  Take 2,000 Units by mouth 2 (two) times daily. For osteoporosis       Physical exam BP 136/81 mmHg  Pulse 62  Temp(Src) 97.5 F (36.4 C)  Resp 18  Wt 212 lb (96.163 kg)  SpO2 94%  Wt Readings from Last 3 Encounters:  05/14/14 212 lb (96.163 kg)  03/16/14 209 lb (94.802 kg)  12/04/12 204 lb 9.6 oz (92.806 kg)   Constitutional: elderly male in no acute distress.    HEENT: Normocephalic and atraumatic. PERRLA. EOM intact. No icterus. Oral mucosa moist. Poor dentition.  Neck: Supple and nontender. No lymphadenopathy, masses, or thyromegaly. No JVD. Cardiac: Normal S1, S2. RRR. Distal pulses intact. Trace pitting bilateral leg edema.  Lungs: No respiratory distress. Breath sounds clear bilaterally without rales, rhonchi, or wheezes. Abdomen: Audible bowel sounds in all quadrants. Soft, nontender, nondistended. No palpable mass.   Musculoskeletal: Able to move all extremities. No joint erythema or tenderness. Skin: Warm and dry. No rash noted. No erythema.  Neurological: Alert and oriented to person, has dysarthria Psychiatric: Appropriate mood and affect. Has dementia at baseline  Labs 03/18/14 na 137, k 4, bun 25, cr 1.6 03/17/14 urine microalbumin/ creatinine 1357 02/28/14 a1c 9.6 02/13/14 lft wnl 01/21/14 t.chol 167, hdl 50, ldl 100, tg 86 01/05/14 wbc 6.6, hb 12.7, hct 40.1, plt 267, na 138, k 4.1, bun 27, cr 1.8  Assessment/plan  Type 2 diabetes mellitus with renal manifestation Urine microalbuminuria positive. Last a1c 9.6 on 02/28/14. On lantus 40 u in am and 45u in pm at present with SSI. Check a1c end of jan 2016 and adjsut lantus further if needed. Goal a1c 7 for him. Continue lipitor and aspirin. Off ACEI with his CKD stage 3 . Eye and foot exam are up to date.   UI Continue myrbetriq for now  Hyperlipidemia Continue lipitor 40 mg daily, monitor lipid panel  Hypertension euvolemic on exam.  Continue tenormin, hydralazine, amlodipine and clonidine and monitor clinically. Discontinuing hctz with impaired renal function for now  CKD stage 3 On hctz which can worsen kidney function. Discontinue HCTZ for now. Monitor bp and if remains elevated, consider adding imdur  Dementia with depression Continue depakote and prozac  with his namenda xr and aricpet, monitor po intake. Has gained weight on review of chart. Monitor clincially

## 2014-06-11 LAB — HEMOGLOBIN A1C: Hgb A1c MFr Bld: 9.2 % — AB (ref 4.0–6.0)

## 2014-07-03 ENCOUNTER — Non-Acute Institutional Stay (SKILLED_NURSING_FACILITY): Payer: Medicare Other | Admitting: Internal Medicine

## 2014-07-03 DIAGNOSIS — N189 Chronic kidney disease, unspecified: Secondary | ICD-10-CM | POA: Diagnosis not present

## 2014-07-03 DIAGNOSIS — I10 Essential (primary) hypertension: Secondary | ICD-10-CM

## 2014-07-03 DIAGNOSIS — N182 Chronic kidney disease, stage 2 (mild): Secondary | ICD-10-CM

## 2014-07-03 DIAGNOSIS — F0280 Dementia in other diseases classified elsewhere without behavioral disturbance: Secondary | ICD-10-CM

## 2014-07-03 DIAGNOSIS — G309 Alzheimer's disease, unspecified: Secondary | ICD-10-CM | POA: Diagnosis not present

## 2014-07-03 DIAGNOSIS — E1122 Type 2 diabetes mellitus with diabetic chronic kidney disease: Secondary | ICD-10-CM | POA: Diagnosis not present

## 2014-07-03 DIAGNOSIS — F03918 Unspecified dementia, unspecified severity, with other behavioral disturbance: Secondary | ICD-10-CM

## 2014-07-03 DIAGNOSIS — F0391 Unspecified dementia with behavioral disturbance: Secondary | ICD-10-CM | POA: Diagnosis not present

## 2014-07-03 DIAGNOSIS — N183 Chronic kidney disease, stage 3 (moderate): Secondary | ICD-10-CM | POA: Diagnosis not present

## 2014-07-03 NOTE — Progress Notes (Signed)
Patient ID: [name], male   DOB: [birth year], 74 y.o.   MRN: [id number]    [facility] Room Number: 806  Place of Service: SN[facility] (31) followed by [facility]   No Known Allergies  Chief Complaint  Patient presents with  . Medical Management of Chronic Issues    HPI:  Alzheimer's disease: Unchanged. There is likely a vascular component as well  CKD (chronic kidney disease) stage 2, GFR 60-89 ml/min: Most recent values would probably place him in a CK D2 range. Renal function has been worse in the past.  Type 2 diabetes mellitus with diabetic chronic kidney disease: Poor control  Essential hypertension: Controlled  CKD stage 3 due to type 2 diabetes mellitus: Improved  Dementia, presenile with delusions, with behavioral disturbance: Appears that he did not tolerate the stopping of Depakote. He is back on this drug now. He has been calm recently.    Medications: Patient's Medications  New Prescriptions   No medications on file  Previous Medications   ACETAMINOPHEN (TYLENOL) 325 MG TABLET    Take 650 mg by mouth every 4 (four) hours as needed. For temp> 100   AMLODIPINE (NORVASC) 10 MG TABLET    Take 10 mg by mouth every evening. Hold for SBP<110   ASPIRIN 81 MG TABLET    Take 81 mg by mouth daily. For CVA   ATENOLOL (TENORMIN) 100 MG TABLET    Take 100 mg by mouth daily. Hold for SBP less than 110 and pulse less than 50   ATORVASTATIN (LIPITOR) 20 MG TABLET    Take 40 mg by mouth at bedtime.    CHOLECALCIFEROL (VITAMIN D3) 2000 UNITS TABS    Take 2,000 Units by mouth 2 (two) times daily. For osteoporosis   CLONIDINE (CATAPRES) 0.3 MG TABLET    Take 0.3 mg by mouth 3 (three) times daily. For HTN   CRANBERRY 475 MG CAPS    Take 475 mg by mouth 2 (two) times daily. For chronic UTI   DIVALPROEX (DEPAKOTE) 125 MG DR TABLET    Take 125 mg by mouth at bedtime.   DONEPEZIL (ARICEPT) 10 MG TABLET    Take 10 mg by mouth at bedtime. For dementia   FLUOXETINE (PROZAC) 20 MG TABLET    Take 20 mg by mouth daily. For depression   HYDRALAZINE (APRESOLINE) 100 MG TABLET    Take 100 mg by mouth every 8 (eight) hours. For HTN   HYDROCHLOROTHIAZIDE (HYDRODIURIL) 25 MG TABLET    Take 25 mg by mouth daily.   INSULIN ASPART (NOVOLOG) 100 UNIT/ML INJECTION    Inject 3 Units into the skin 3 (three) times daily with meals. For CBGs > 200   INSULIN GLARGINE (LANTUS) 100 UNIT/ML INJECTION    Inject 40-45 Units into the skin 2 (two) times daily. 30 units SQ qam and 35 units SQ QHS   IPRATROPIUM (ATROVENT) 0.02 % NEBULIZER SOLUTION    Take 2.5 mLs (0.5 mg total) by nebulization every 6 (six) hours.   LORAZEPAM (ATIVAN) 0.5 MG TABLET    Take one tablet by mouth every 8 hours as needed for agitation   MEMANTINE HCL ER (NAMENDA XR) 28 MG CP24    Take 28 mg by mouth daily.   MIRABEGRON ER (MYRBETRIQ) 25 MG TB24 TABLET    Take 25 mg by mouth daily. For BPH   POLYETHYLENE GLYCOL (MIRALAX / GLYCOLAX) PACKET    Take 17 g by mouth daily. Mix in  6-8 oz of liquids daily. For constipation   TRAVOPROST, BENZALKONIUM, (TRAVATAN) 0.004 % OPHTHALMIC SOLUTION    Place 1 drop into both eyes at bedtime. Wait 3- 5 minutes between eye medications.  Modified Medications   No medications on file  Discontinued Medications   No medications on file     Review of Systems  Constitutional: Positive for fatigue. Negative for fever, activity change, appetite change and unexpected weight change.       Chronic history of spending a lot of time in bed.  HENT: Positive for dental problem and hearing loss. Negative for congestion, ear pain, rhinorrhea, sore throat, tinnitus, trouble swallowing and voice change.   Eyes:       Corrective lenses  Respiratory: Negative for cough, choking, chest tightness, shortness of breath and wheezing.   Cardiovascular: Negative for chest pain, palpitations and leg swelling.  Gastrointestinal: Negative for nausea, abdominal pain, diarrhea, constipation  and abdominal distention.  Endocrine: Negative for cold intolerance, heat intolerance, polydipsia, polyphagia and polyuria.       Diabetic  Genitourinary: Negative for dysuria, urgency, frequency and testicular pain.       Not incontinent  Musculoskeletal: Negative for myalgias, back pain, arthralgias, gait problem and neck pain.  Skin: Negative for color change, pallor and rash.  Allergic/Immunologic: Negative.   Neurological: Negative for dizziness, tremors, syncope, speech difficulty, weakness, numbness and headaches.       Significant dementia. Responsive to questions. Mild dysarthria.  Hematological: Negative for adenopathy. Does not bruise/bleed easily.  Psychiatric/Behavioral: Negative for hallucinations, behavioral problems, confusion, sleep disturbance and decreased concentration. The patient is not nervous/anxious.     Filed Vitals:   07/03/14 1548  BP: 142/96  Pulse: 61  Temp: 97.1 F (36.2 C)  Resp: 18  Height: 5\' 10"  (1.778 m)  Weight: 215 lb 12.8 oz (97.886 kg)   Body mass index is 30.96 kg/(m^2).  Physical Exam  Constitutional: He is oriented to person, place, and time. He appears well-developed and well-nourished. No distress.  HENT:  Right Ear: External ear normal.  Left Ear: External ear normal.  Nose: Nose normal.  Mouth/Throat: Oropharynx is clear and moist. No oropharyngeal exudate.  Poor dentition  Eyes: Conjunctivae and EOM are normal. Pupils are equal, round, and reactive to light.  Neck: No JVD present. No tracheal deviation present. No thyromegaly present.  Cardiovascular: Normal rate, regular rhythm, normal heart sounds and intact distal pulses.  Exam reveals no gallop and no friction rub.   No murmur heard. Pulmonary/Chest: No respiratory distress. He has no wheezes. He has no rales. He exhibits no tenderness.  Abdominal: He exhibits no distension and no mass. There is no tenderness.  Musculoskeletal: Normal range of motion. He exhibits no edema or  tenderness.  Lymphadenopathy:    He has no cervical adenopathy.  Neurological: He is alert and oriented to person, place, and time. He has normal reflexes. No cranial nerve deficit. Coordination normal.  Mild dysarthria. Significant dementia.  Skin: No rash noted. No erythema. No pallor.  Psychiatric: He has a normal mood and affect. His behavior is normal. Judgment and thought content normal.     Labs reviewed: No visits with results within 3 Month(s) from this visit. Latest known visit with results is:  Nursing Home on 03/16/2014  Component Date Value Ref Range Status  . Hemoglobin 01/05/2014 12.7* 13.5 - 17.5 g/dL Final  . HCT 01/05/2014 40* 41 - 53 % Final  . Platelets 01/05/2014 267  150 - 399 K/L  Final  . WBC 01/05/2014 6.6   Final  . Glucose 01/05/2014 139   Final  . BUN 01/05/2014 27* 4 - 21 mg/dL Final  . Creatinine 01/05/2014 1.8* 0.6 - 1.3 mg/dL Final  . Potassium 01/05/2014 4.1  3.4 - 5.3 mmol/L Final  . Sodium 01/05/2014 138  137 - 147 mmol/L Final  . Triglycerides 01/21/2014 86  40 - 160 mg/dL Final  . Cholesterol 01/21/2014 167  0 - 200 mg/dL Final  . HDL 01/21/2014 50  35 - 70 mg/dL Final  . LDL Cholesterol 01/21/2014 100   Final  . Hgb A1c MFr Bld 02/28/2014 9.6* 4.0 - 6.0 % Final     Assessment/Plan 1. Alzheimer's disease Unchanged. There is likely a vascular component to his dementia as well.  2. CKD (chronic kidney disease) stage 2, GFR 60-89 ml/min Stable  3. Type 2 diabetes mellitus with diabetic chronic kidney disease Diabetes is not well controlled. Hemoglobin A1c remains high at 9.2. On 06/16/14 Lantus was increased to 54 units twice daily. There've been steady increases in his insulin over the last couple of months.  4. Essential hypertension Controlled  5. CKD stage 3 due to type 2 diabetes mellitus Improved  6. Dementia, presenile with delusions, with behavioral disturbance Patient had an attempt to take him off Depakote 06/16/14. This drug  was resumed 1 week later. He is calm and cheerful this afternoon when I speak with him. His roommate says he is a good roommate.

## 2014-07-20 LAB — CBC AND DIFFERENTIAL
HCT: 41 % (ref 41–53)
Hemoglobin: 13.3 g/dL — AB (ref 13.5–17.5)
PLATELETS: 288 10*3/uL (ref 150–399)
WBC: 7.2 10*3/mL

## 2014-08-05 ENCOUNTER — Non-Acute Institutional Stay (SKILLED_NURSING_FACILITY): Payer: Medicare Other | Admitting: Internal Medicine

## 2014-08-05 DIAGNOSIS — J302 Other seasonal allergic rhinitis: Secondary | ICD-10-CM | POA: Insufficient documentation

## 2014-08-05 DIAGNOSIS — E785 Hyperlipidemia, unspecified: Secondary | ICD-10-CM | POA: Diagnosis not present

## 2014-08-05 DIAGNOSIS — N183 Chronic kidney disease, stage 3 unspecified: Secondary | ICD-10-CM | POA: Insufficient documentation

## 2014-08-05 DIAGNOSIS — F329 Major depressive disorder, single episode, unspecified: Secondary | ICD-10-CM | POA: Diagnosis not present

## 2014-08-05 DIAGNOSIS — F0280 Dementia in other diseases classified elsewhere without behavioral disturbance: Secondary | ICD-10-CM | POA: Diagnosis not present

## 2014-08-05 DIAGNOSIS — I5032 Chronic diastolic (congestive) heart failure: Secondary | ICD-10-CM

## 2014-08-05 DIAGNOSIS — I1310 Hypertensive heart and chronic kidney disease without heart failure, with stage 1 through stage 4 chronic kidney disease, or unspecified chronic kidney disease: Secondary | ICD-10-CM

## 2014-08-05 DIAGNOSIS — F0393 Unspecified dementia, unspecified severity, with mood disturbance: Secondary | ICD-10-CM | POA: Insufficient documentation

## 2014-08-05 DIAGNOSIS — N189 Chronic kidney disease, unspecified: Secondary | ICD-10-CM

## 2014-08-05 DIAGNOSIS — E1122 Type 2 diabetes mellitus with diabetic chronic kidney disease: Secondary | ICD-10-CM | POA: Diagnosis not present

## 2014-08-05 NOTE — Progress Notes (Signed)
Patient ID: [name], male   DOB: 1941-01-29, 74 y.o.   MRN: [id number]    [facility]  Chief complaint- medical management of chronic issues  Allergies- NKDA  Code status: DNR  HPI 74 y.o. male patient is seen for routine visit. He denies any concerns. No new concern from staff. No falls reported. No new skin concerns. No new behavior changes.  He has runny nose with clear discharge. denies sore throat, denies cough He has PMH of DM2, HLD, CHF, HTN, depression, glaucoma and dementia.   ROS Constitutional: Negative for fever and chills HENT: Negative for congestion, and sore throat Eyes: Negative for eye pain and eye discharge Cardiovascular: Negative for chest pain and leg swelling Respiratory: Negative cough, shortness of breath, and wheezing.   Gastrointestinal: Negative for nausea and vomiting. Negative for diarrhea and constipation   Musculoskeletal: Negative for back pain and joint pain Neurological: Negative for dizziness and headache. Has dysarthria Skin: Negative for rash and wound.   Psychiatric: Negative for depression   Past medical history reviewed    Medication List       This list is accurate as of: 08/05/14  3:36 PM.  Always use your most recent med list.               aspirin 81 MG tablet  Take 81 mg by mouth daily. For CVA     atorvastatin 20 MG tablet  Commonly known as:  LIPITOR  Take 40 mg by mouth at bedtime.     carvedilol 6.25 MG tablet  Commonly known as:  COREG  Take 6.25 mg by mouth 2 (two) times daily with a meal.     Cranberry 475 MG Caps  Take 450 mg by mouth 2 (two) times daily. For chronic UTI     divalproex 125 MG DR tablet  Commonly known as:  DEPAKOTE  Take 125 mg by mouth 2 (two) times daily.     donepezil 10 MG tablet  Commonly known as:  ARICEPT  Take 10 mg by mouth at bedtime. For dementia     FLUoxetine 20 MG tablet  Commonly known as:  PROZAC  Take 20 mg by  mouth daily. For depression     hydrALAZINE 100 MG tablet  Commonly known as:  APRESOLINE  Take 100 mg by mouth every 8 (eight) hours. For HTN     insulin aspart 100 UNIT/ML injection  Commonly known as:  novoLOG  Inject 3 Units into the skin 3 (three) times daily with meals. For CBGs > 200     insulin glargine 100 UNIT/ML injection  Commonly known as:  LANTUS  Inject 52-54 Units into the skin 2 (two) times daily. 54 units SQ qam and 52 units SQ QHS     ipratropium 0.02 % nebulizer solution  Commonly known as:  ATROVENT  Take 2.5 mLs (0.5 mg total) by nebulization every 6 (six) hours.     isosorbide dinitrate 30 MG tablet  Commonly known as:  ISORDIL  Take 30 mg by mouth daily.     mirabegron ER 25 MG Tb24 tablet  Commonly known as:  MYRBETRIQ  Take 25 mg by mouth daily. For BPH     NAMENDA XR 28 MG Cp24 24 hr capsule  Generic drug:  memantine  Take 28 mg by mouth daily.     polyethylene glycol packet  Commonly known as:  MIRALAX / GLYCOLAX  Take 17 g by mouth daily.  Mix in 6-8 oz of liquids daily. For constipation     travoprost (benzalkonium) 0.004 % ophthalmic solution  Commonly known as:  TRAVATAN  Place 1 drop into both eyes at bedtime. Wait 3- 5 minutes between eye medications.     Vitamin D3 2000 UNITS Tabs  Take 2,000 Units by mouth daily. For osteoporosis        Physical exam BP 141/75 mmHg  Pulse 72  Temp(Src) 96.2 F (35.7 C)  Resp 18  SpO2 98%  Constitutional: elderly male in no acute distress.    HEENT: Normocephalic and atraumatic. PERRLA. EOM intact. No icterus. Oral mucosa moist. Poor dentition.  clear nasal discharge Neck: Supple and nontender. No lymphadenopathy, masses, or thyromegaly. No JVD. Cardiac: Normal S1, S2. RRR. Distal pulses intact. Trace bilateral leg edema.   Lungs: No respiratory distress. Breath sounds clear bilaterally without rales, rhonchi, or wheezes. Abdomen: Audible bowel sounds in all quadrants. Soft, nontender,  nondistended. No palpable mass.   Musculoskeletal: Able to move all extremities. No joint erythema or tenderness. Skin: Warm and dry. No rash noted. No erythema.   Neurological: Alert and oriented to person, has dysarthria Psychiatric: Appropriate mood and affect. Has dementia at baseline  Labs 06/15/14 na 140, k 4.5, bun 21, cr 1.73, glu 96, cr cl 51 06/11/14 t.chol 132, ldl 79, hdl 41, a1c 9.2 03/18/14 na 137, k 4, bun 25, cr 1.6 03/17/14 urine microalbumin/ creatinine 1357 02/28/14 a1c 9.6 02/13/14 lft wnl 01/21/14 t.chol 167, hdl 50, ldl 100, tg 86 01/05/14 wbc 6.6, hb 12.7, hct 40.1, plt 267, na 138, k 4.1, bun 27, cr 1.8  Assessment/plan  Allergic rhinitis With clear nasal discharge, add loratadine 10 mg daily x 1 week and then change to daily prn, reassess if no improvement  Type 2 diabetes mellitus with CKD Recent a1c 9.2. Continue lantus 54 u am and 52 u pm with premeal insulin.  Goal a1c 7 for him. Continue lipitor and aspirin. Off ACEI with his CKD stage 3 . Eye and foot exam are up to date.   Hyperlipidemia LDL at goal, decrease lipitor to 20 mg daily  Hypertensive heart and renal disease Stable bp readings. Continue coreg 6.125 mg bid, hydralazine 100 mg tid and isodril 30 mg daily. Monitor bp readings  Chronic diastolic heart failure Stable, continue coreg, isodril and hydralazine. Monitor weight and BP with renal function  Depression due to dementia Stable, continue prozac and depakote

## 2014-08-05 NOTE — Progress Notes (Signed)
Improved bp reading

## 2014-08-25 ENCOUNTER — Non-Acute Institutional Stay (SKILLED_NURSING_FACILITY): Payer: Medicare Other | Admitting: Internal Medicine

## 2014-08-25 DIAGNOSIS — I2510 Atherosclerotic heart disease of native coronary artery without angina pectoris: Secondary | ICD-10-CM | POA: Insufficient documentation

## 2014-08-25 DIAGNOSIS — J309 Allergic rhinitis, unspecified: Secondary | ICD-10-CM | POA: Diagnosis not present

## 2014-08-25 DIAGNOSIS — E1139 Type 2 diabetes mellitus with other diabetic ophthalmic complication: Secondary | ICD-10-CM | POA: Diagnosis not present

## 2014-08-25 DIAGNOSIS — G308 Other Alzheimer's disease: Secondary | ICD-10-CM

## 2014-08-25 DIAGNOSIS — F0281 Dementia in other diseases classified elsewhere with behavioral disturbance: Secondary | ICD-10-CM | POA: Insufficient documentation

## 2014-08-25 DIAGNOSIS — G309 Alzheimer's disease, unspecified: Principal | ICD-10-CM | POA: Insufficient documentation

## 2014-08-25 DIAGNOSIS — F02818 Dementia in other diseases classified elsewhere, unspecified severity, with other behavioral disturbance: Secondary | ICD-10-CM

## 2014-08-25 NOTE — Progress Notes (Signed)
Patient ID: [name], male   DOB: 04/19/1941, 74 y.o.   MRN: [id number]    [facility]  Chief complaint- medical management of chronic issues  Allergies- NKDA  Code status: DNR  HPI 74 y.o. male patient is seen for routine visit. He has dementia and has behavioral disturbance overall controlled with depakote but as per staff there are times at night where he gets somewhat agitated and restless and has refused care at times. This am he is at his baseline, pleasantly confused and in no distress. He denies any concerns. No new concern from staff. No falls reported. No new skin concerns. No new behavior changes.   He has PMH of DM2, HLD, CHF, HTN, depression, glaucoma and dementia.   ROS Constitutional: Negative for fever and chills HENT: Negative for congestion, and sore throat Eyes: Negative for eye pain and eye discharge Cardiovascular: Negative for chest pain and leg swelling Respiratory: Negative cough, shortness of breath, and wheezing.   Gastrointestinal: Negative for nausea and vomiting. Negative for diarrhea and constipation   Musculoskeletal: Negative for back pain and joint pain Neurological: Negative for dizziness and headache. Has dysarthria Skin: Negative for rash and wound.   Psychiatric: Negative for depression   Past medical history reviewed    Medication List       This list is accurate as of: 08/25/14 12:54 PM.  Always use your most recent med list.               aspirin 81 MG tablet  Take 81 mg by mouth daily. For CVA     atorvastatin 20 MG tablet  Commonly known as:  LIPITOR  Take 20 mg by mouth at bedtime.     betaxolol 0.25 % ophthalmic suspension  Commonly known as:  BETOPTIC-S  Place 1 drop into both eyes 2 (two) times daily.     carvedilol 6.25 MG tablet  Commonly known as:  COREG  Take 6.25 mg by mouth 2 (two) times daily with a meal.     Cranberry 475 MG Caps  Take 450 mg by mouth 2  (two) times daily. For chronic UTI     divalproex 125 MG DR tablet  Commonly known as:  DEPAKOTE  Take 125 mg by mouth 2 (two) times daily.     donepezil 10 MG tablet  Commonly known as:  ARICEPT  Take 10 mg by mouth at bedtime. For dementia     DUONATE-12 PO  Take by mouth. Administer 3 ml per nebulizer every 4 hours as needed for wheezing     FLUoxetine 20 MG tablet  Commonly known as:  PROZAC  Take 20 mg by mouth daily. For depression     hydrALAZINE 100 MG tablet  Commonly known as:  APRESOLINE  Take 100 mg by mouth every 8 (eight) hours. For HTN     insulin glargine 100 UNIT/ML injection  Commonly known as:  LANTUS  Inject 52-54 Units into the skin 2 (two) times daily. 54 units SQ qam and 52 units SQ QHS     insulin lispro 100 UNIT/ML injection  Commonly known as:  HUMALOG  Inject into the skin 3 (three) times daily before meals. Administer 5 units subcutaneously three times a day with meals for bs     isosorbide mononitrate 30 MG 24 hr tablet  Commonly known as:  IMDUR  Take 30 mg by mouth daily. Take 1 tablet by mouth daily for CHF  loratadine 10 MG tablet  Commonly known as:  CLARITIN  Take 10 mg by mouth daily. Take by mouth x  7 days for allergies     magic mouthwash Soln  Take by mouth. Swab as directed three times a day if patient is unable to swish and swallow     mirabegron ER 25 MG Tb24 tablet  Commonly known as:  MYRBETRIQ  Take 25 mg by mouth daily. For BPH     NAMENDA XR 28 MG Cp24 24 hr capsule  Generic drug:  memantine  Take 28 mg by mouth daily. For dementia     polyethylene glycol packet  Commonly known as:  MIRALAX / GLYCOLAX  Take 17 g by mouth daily. Mix in 6-8 oz of liquids daily. For constipation     travoprost (benzalkonium) 0.004 % ophthalmic solution  Commonly known as:  TRAVATAN  Place 1 drop into both eyes at bedtime. Wait 3- 5 minutes between eye medications.     Vitamin D3 2000 UNITS Tabs  Take 2,000 Units by mouth daily.  For osteoporosis        Physical exam BP 140/81 mmHg  Pulse 79  Temp(Src) 98.4 F (36.9 C)  Resp 18  Ht 5\' 10"  (1.778 m)  Wt 210 lb (95.255 kg)  BMI 30.13 kg/m2  SpO2 92%  Wt Readings from Last 3 Encounters:  08/25/14 210 lb (95.255 kg)  07/03/14 215 lb 12.8 oz (97.886 kg)  05/14/14 212 lb (96.163 kg)    Constitutional: elderly male in no acute distress.    HEENT: Normocephalic and atraumatic. PERRLA. EOM intact. No icterus. Oral mucosa moist. Poor dentition.  clear nasal discharge Neck: Supple and nontender. No lymphadenopathy, masses, or thyromegaly. No JVD. Cardiac: Normal S1, S2. RRR. Distal pulses intact. Trace bilateral leg edema.   Lungs: No respiratory distress. Breath sounds clear bilaterally without rales, rhonchi, or wheezes. Abdomen: Audible bowel sounds in all quadrants. Soft, nontender, nondistended. No palpable mass.   Musculoskeletal: Able to move all extremities. No joint erythema or tenderness. Skin: Warm and dry. No rash noted. No erythema.   Neurological: Alert and oriented to person, has dysarthria Psychiatric: Appropriate mood and affect. Has dementia at baseline  Labs 07/20/14 wbc 7.2, hb 13.3, hct 40.6, plt 288, valproic acid < 12.5 06/15/14 na 140, k 4.5, bun 21, cr 1.73, glu 96, cr cl 51 06/11/14 t.chol 132, ldl 79, hdl 41, a1c 9.2 03/18/14 na 137, k 4, bun 25, cr 1.6 03/17/14 urine microalbumin/ creatinine 1357 02/28/14 a1c 9.6 02/13/14 lft wnl 01/21/14 t.chol 167, hdl 50, ldl 100, tg 86 01/05/14 wbc 6.6, hb 12.7, hct 40.1, plt 267, na 138, k 4.1, bun 27, cr 1.8  Assessment/plan  Alzheimer's disease with behavioral disturbance Continue aricept and namenda xr for now. Continue depakote sprinkles.monitor for decline, provide supportive care, fall precautions and pressure ulcer prophylaxis. Also on prozac and followed by [facility]. With night time behavioral changes concern of sundowning and delirium in setting of his dementia. Currently calm behavior. If  this persists, can consider low dose seroquel to help with delirium  Allergic rhinitis Persists. 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 Continue lantus 54 u am and 52 u pm with premeal insulin.  Goal a1c 7 for him. Continue lipitor and aspirin. Off ACEI with his CKD stage 3 . Eye and foot exam are up to date. Continue his eye drops travatan and betoptic. Pending  a1c in 5/16  Atherosclerotic heart disease Remains chest pain free. Continue isodril 30 mg daily, coreg 6.25 mg bid, aspirin 81 mg daily and lipitor 20 mg daily

## 2014-09-09 ENCOUNTER — Non-Acute Institutional Stay (SKILLED_NURSING_FACILITY): Payer: Medicare Other | Admitting: Internal Medicine

## 2014-09-09 DIAGNOSIS — E785 Hyperlipidemia, unspecified: Secondary | ICD-10-CM

## 2014-09-09 DIAGNOSIS — F322 Major depressive disorder, single episode, severe without psychotic features: Secondary | ICD-10-CM | POA: Diagnosis not present

## 2014-09-09 DIAGNOSIS — N3281 Overactive bladder: Secondary | ICD-10-CM

## 2014-09-09 DIAGNOSIS — I10 Essential (primary) hypertension: Secondary | ICD-10-CM | POA: Diagnosis not present

## 2014-09-09 DIAGNOSIS — F329 Major depressive disorder, single episode, unspecified: Secondary | ICD-10-CM

## 2014-09-09 NOTE — Progress Notes (Signed)
Patient ID: [name], male   DOB: [birth year], 74 y.o.   MRN: [id number]    [facility]  Chief complaint- medical management of chronic issues  Allergies- NKDA  Code status: DNR  HPI 74 y.o. male patient is seen for routine visit. He denies any concerns. No new concern from staff. No falls reported. No new skin concerns. No new behavior changes.  He has PMH of DM2, HLD, CHF, HTN, depression, glaucoma and dementia.   ROS Constitutional: Negative for fever and chills HENT: Negative for congestion, and sore throat Eyes: Negative for eye pain and eye discharge Cardiovascular: Negative for chest pain and leg swelling Respiratory: Negative cough, shortness of breath, and wheezing.   Gastrointestinal: Negative for nausea and vomiting. Negative for diarrhea and constipation   Musculoskeletal: Negative for back pain and falls Neurological: Negative for dizziness and headache. Has dysarthria Skin: Negative for rash and wound.   Psychiatric: Negative for depression   Past medical history reviewed    Medication List       This list is accurate as of: 09/09/14 11:59 PM.  Always use your most recent med list.               aspirin 81 MG tablet  Take 81 mg by mouth daily. For CVA     atorvastatin 20 MG tablet  Commonly known as:  LIPITOR  Take 20 mg by mouth at bedtime.     betaxolol 0.25 % ophthalmic suspension  Commonly known as:  BETOPTIC-S  Place 1 drop into both eyes 2 (two) times daily.     carvedilol 6.25 MG tablet  Commonly known as:  COREG  Take 6.25 mg by mouth 2 (two) times daily with a meal.     Cranberry 475 MG Caps  Take 450 mg by mouth 2 (two) times daily. For chronic UTI     divalproex 125 MG DR tablet  Commonly known as:  DEPAKOTE  Take 125 mg by mouth 2 (two) times daily.     donepezil 10 MG tablet  Commonly known as:  ARICEPT  Take 10 mg by mouth at bedtime. For dementia     DUONATE-12 PO  Take by  mouth. Administer 3 ml per nebulizer every 4 hours as needed for wheezing     FLUoxetine 20 MG tablet  Commonly known as:  PROZAC  Take 20 mg by mouth daily. For depression     hydrALAZINE 100 MG tablet  Commonly known as:  APRESOLINE  Take 100 mg by mouth every 8 (eight) hours. For HTN     insulin glargine 100 UNIT/ML injection  Commonly known as:  LANTUS  Inject 52-54 Units into the skin 2 (two) times daily. 54 units SQ qam and 52 units SQ QHS     insulin lispro 100 UNIT/ML injection  Commonly known as:  HUMALOG  Inject 5 Units into the skin 3 (three) times daily before meals. Subcutaneously with meals for blood sugar     isosorbide mononitrate 30 MG 24 hr tablet  Commonly known as:  IMDUR  Take 30 mg by mouth daily. Take 1 tablet by mouth daily for CHF     loratadine 10 MG tablet  Commonly known as:  CLARITIN  Take 10 mg by mouth daily.     magic mouthwash Soln  Take by mouth. Swab as directed three times a day if patient is unable to swish and swallow     mirabegron ER  25 MG Tb24 tablet  Commonly known as:  MYRBETRIQ  Take 25 mg by mouth daily. For BPH     NAMENDA XR 28 MG Cp24 24 hr capsule  Generic drug:  memantine  Take 28 mg by mouth daily. For dementia     polyethylene glycol packet  Commonly known as:  MIRALAX / GLYCOLAX  Take 17 g by mouth daily. Mix in 6-8 oz of liquids daily. For constipation     travoprost (benzalkonium) 0.004 % ophthalmic solution  Commonly known as:  TRAVATAN  Place 1 drop into both eyes at bedtime. Wait 3- 5 minutes between eye medications.     Vitamin D3 2000 UNITS Tabs  Take 2,000 Units by mouth daily. For osteoporosis        Physical exam BP 127/74 mmHg  Pulse 78  Temp(Src) 98.1 F (36.7 C)  Resp 18  Wt 212 lb (96.163 kg)  SpO2 95%   Constitutional: elderly male in no acute distress.    HEENT: Normocephalic and atraumatic. PERRLA. EOM intact. No icterus. Oral mucosa moist. Poor dentition.  clear nasal discharge Neck:  Supple and nontender. No lymphadenopathy, masses, or thyromegaly. No JVD. Cardiac: Normal S1, S2. RRR. Distal pulses intact. Trace bilateral leg edema.   Lungs: No respiratory distress. Breath sounds clear bilaterally without rales, rhonchi, or wheezes. Abdomen: Audible bowel sounds in all quadrants. Soft, nontender, nondistended. No palpable mass.   Musculoskeletal: Able to move all extremities. No joint erythema or tenderness. Skin: Warm and dry. No rash noted. No erythema.   Neurological: Alert and oriented to person, has dysarthria Psychiatric: Appropriate mood and affect. Has dementia at baseline  Labs 07/20/14 wbc 7.2, hb 13.3, hct 40.6, plt 288, valproic acid < 12.5 06/15/14 na 140, k 4.5, bun 21, cr 1.73, glu 96, cr cl 51 06/11/14 t.chol 132, ldl 79, hdl 41, a1c 9.2 03/18/14 na 137, k 4, bun 25, cr 1.6 03/17/14 urine microalbumin/ creatinine 1357 02/28/14 a1c 9.6 02/13/14 lft wnl 01/21/14 t.chol 167, hdl 50, ldl 100, tg 86 01/05/14 wbc 6.6, hb 12.7, hct 40.1, plt 267, na 138, k 4.1, bun 27, cr 1.8  Assessment/plan  HLD Continue lipitor 20 mg daily, tolerating it well, monitor  Chronic depression With dementia, continue prozac and depakote for now  OAB Stable with myrbetriq for now, monitor  HTN Stable, continue aspirin with coreg, hydralazine and monitor bp readings   Blanchie Serve, MD  [facility] [phone] (Monday-Friday 8 am - 5 pm) [phone] (afterhours)

## 2014-09-16 LAB — BASIC METABOLIC PANEL
BUN: 23 mg/dL — AB (ref 4–21)
CREATININE: 1.7 mg/dL — AB (ref 0.6–1.3)
Glucose: 67 mg/dL
POTASSIUM: 4.3 mmol/L (ref 3.4–5.3)
Sodium: 141 mmol/L (ref 137–147)

## 2014-09-16 LAB — LIPID PANEL
Cholesterol: 162 mg/dL (ref 0–200)
HDL: 50 mg/dL (ref 35–70)
LDL CALC: 97 mg/dL
TRIGLYCERIDES: 73 mg/dL (ref 40–160)

## 2014-09-16 LAB — CBC AND DIFFERENTIAL
HEMATOCRIT: 41 % (ref 41–53)
HEMOGLOBIN: 13.9 g/dL (ref 13.5–17.5)
PLATELETS: 289 10*3/uL (ref 150–399)
WBC: 4.6 10*3/mL

## 2014-09-16 LAB — HEPATIC FUNCTION PANEL
ALT: 16 U/L (ref 10–40)
AST: 15 U/L (ref 14–40)
Alkaline Phosphatase: 108 U/L (ref 25–125)
BILIRUBIN, TOTAL: 0.3 mg/dL

## 2014-09-16 LAB — HEMOGLOBIN A1C: Hgb A1c MFr Bld: 7.9 % — AB (ref 4.0–6.0)

## 2014-10-21 ENCOUNTER — Encounter: Payer: Self-pay | Admitting: Internal Medicine

## 2014-10-21 ENCOUNTER — Non-Acute Institutional Stay (SKILLED_NURSING_FACILITY): Payer: Medicare Other | Admitting: Internal Medicine

## 2014-10-21 DIAGNOSIS — E785 Hyperlipidemia, unspecified: Secondary | ICD-10-CM

## 2014-10-21 DIAGNOSIS — M8580 Other specified disorders of bone density and structure, unspecified site: Secondary | ICD-10-CM | POA: Insufficient documentation

## 2014-10-21 DIAGNOSIS — N189 Chronic kidney disease, unspecified: Secondary | ICD-10-CM | POA: Diagnosis not present

## 2014-10-21 DIAGNOSIS — F01518 Vascular dementia, unspecified severity, with other behavioral disturbance: Secondary | ICD-10-CM

## 2014-10-21 DIAGNOSIS — F0151 Vascular dementia with behavioral disturbance: Secondary | ICD-10-CM | POA: Diagnosis not present

## 2014-10-21 DIAGNOSIS — G309 Alzheimer's disease, unspecified: Secondary | ICD-10-CM | POA: Diagnosis not present

## 2014-10-21 DIAGNOSIS — M810 Age-related osteoporosis without current pathological fracture: Secondary | ICD-10-CM | POA: Diagnosis not present

## 2014-10-21 DIAGNOSIS — E1122 Type 2 diabetes mellitus with diabetic chronic kidney disease: Secondary | ICD-10-CM | POA: Diagnosis not present

## 2014-10-21 NOTE — Progress Notes (Signed)
Patient ID: [name], male   DOB: Feb 05, 1941, 74 y.o.   MRN: [id number]    [place]  Code Status: DNR  Chief Complaint  Patient presents with  . Medical Management of Chronic Issues    Routine Visit    No Known Allergies   HPI 74 y.o. male patient is seen for routine visit. He has been at his baseline with his dementia. No falls reported. No acute behavioral changes or skin concerns reported. He has dementia and has occasional restless and has refused care at times. He denies any concerns. No new concern from staff.  His a1c suggestive of improved control in sugar. He has PMH of DM2, HLD, CHF, HTN, depression, glaucoma and dementia.   ROS Constitutional: Negative for fever and chills HENT: Negative for congestion, and sore throat Eyes: Negative for eye pain and eye discharge Cardiovascular: Negative for chest pain and leg swelling Respiratory: Negative cough, shortness of breath, and wheezing.   Gastrointestinal: Negative for nausea and vomiting. Negative for diarrhea and constipation   Musculoskeletal: Negative for back pain and joint pain. Has osteoporosis. Neurological: Negative for dizziness and headache. Has dysarthria Skin: Negative for rash and wound.   Psychiatric: Negative for depression   Past medical history reviewed    Medication List       This list is accurate as of: 10/21/14 11:47 AM.  Always use your most recent med list.               aspirin 81 MG tablet  Take 81 mg by mouth daily. For CVA     atorvastatin 20 MG tablet  Commonly known as:  LIPITOR  Take 20 mg by mouth at bedtime.     betaxolol 0.25 % ophthalmic suspension  Commonly known as:  BETOPTIC-S  Place 1 drop into both eyes 2 (two) times daily.     carvedilol 6.25 MG tablet  Commonly known as:  COREG  Take 6.25 mg by mouth 2 (two) times daily with a meal.     Cranberry 475 MG Caps  Take 450 mg by mouth 2 (two) times daily. For chronic UTI     divalproex  125 MG DR tablet  Commonly known as:  DEPAKOTE  Take 125 mg by mouth 2 (two) times daily.     donepezil 10 MG tablet  Commonly known as:  ARICEPT  Take 10 mg by mouth at bedtime. For dementia     FLUoxetine 20 MG tablet  Commonly known as:  PROZAC  Take 20 mg by mouth daily. For depression     hydrALAZINE 100 MG tablet  Commonly known as:  APRESOLINE  Take 100 mg by mouth every 8 (eight) hours. For HTN     insulin glargine 100 UNIT/ML injection  Commonly known as:  LANTUS  Inject 52-54 Units into the skin 2 (two) times daily. 54 units SQ qam and 52 units SQ QHS     insulin lispro 100 UNIT/ML injection  Commonly known as:  HUMALOG  Inject 5 Units into the skin 3 (three) times daily before meals. Subcutaneously with meals for blood sugar     ipratropium-albuterol 0.5-2.5 (3) MG/3ML Soln  Commonly known as:  DUONEB  Take 3 mLs by nebulization every 4 (four) hours as needed (For Wheezing).     isosorbide mononitrate 30 MG 24 hr tablet  Commonly known as:  IMDUR  Take 30 mg by mouth daily. Take 1 tablet by mouth daily for CHF  loratadine 10 MG tablet  Commonly known as:  CLARITIN  Take 10 mg by mouth daily.     LORazepam 0.5 MG tablet  Commonly known as:  ATIVAN  1/2 by mouth twice daily as needed for agitation     magic mouthwash Soln  Take by mouth. Swab as directed three times a day if patient is unable to swish and swallow     mirabegron ER 25 MG Tb24 tablet  Commonly known as:  MYRBETRIQ  Take 25 mg by mouth daily. For BPH     NAMENDA XR 28 MG Cp24 24 hr capsule  Generic drug:  memantine  Take 28 mg by mouth daily. For dementia     polyethylene glycol packet  Commonly known as:  MIRALAX / GLYCOLAX  Take 17 g by mouth daily. Mix in 6-8 oz of liquids daily. For constipation     travoprost (benzalkonium) 0.004 % ophthalmic solution  Commonly known as:  TRAVATAN  Place 1 drop into both eyes at bedtime. Wait 3- 5 minutes between eye medications.     Vitamin D3  2000 UNITS Tabs  Take 2,000 Units by mouth daily. For osteoporosis        Physical exam BP 125/86 mmHg  Pulse 76  Temp(Src) 97 F (36.1 C) (Oral)  Resp 18  Ht 5\' 10"  (1.778 m)  Wt 210 lb (95.255 kg)  BMI 30.13 kg/m2  SpO2 95%  Wt Readings from Last 3 Encounters:  10/21/14 210 lb (95.255 kg)  09/09/14 212 lb (96.163 kg)  08/25/14 210 lb (95.255 kg)    Constitutional: elderly male in no acute distress.    HEENT: Normocephalic and atraumatic. PERRLA. EOM intact. No icterus. Oral mucosa moist. Poor dentition.  clear nasal discharge Neck: Supple and nontender. No lymphadenopathy, masses, or thyromegaly. No JVD. Cardiac: Normal S1, S2. RRR. Distal pulses intact. Trace bilateral leg edema.   Lungs: No respiratory distress. Breath sounds clear bilaterally without rales, rhonchi, or wheezes. Abdomen: Audible bowel sounds in all quadrants. Soft, nontender, nondistended. No palpable mass.   Musculoskeletal: Able to move all extremities. No joint erythema or tenderness. Skin: Warm and dry. No rash noted. No erythema.   Neurological: Alert and oriented to person, has dysarthria Psychiatric: Appropriate mood and affect. Has dementia at baseline  Labs 09/16/14 wbc 7.4, hb 13.9, plt 289, na 141, k 4.3, bun 23, cr 1.69, glu 67, a1c 7.9 09/14/14 t.chol 162, ldl 97, hdl 50, tg 73 07/20/14 wbc 7.2, hb 13.3, hct 40.6, plt 288, valproic acid < 12.5 06/15/14 na 140, k 4.5, bun 21, cr 1.73, glu 96, cr cl 51 06/11/14 t.chol 132, ldl 79, hdl 41, a1c 9.2 03/18/14 na 137, k 4, bun 25, cr 1.6 03/17/14 urine microalbumin/ creatinine 1357 02/28/14 a1c 9.6 02/13/14 lft wnl 01/21/14 t.chol 167, hdl 50, ldl 100, tg 86 01/05/14 wbc 6.6, hb 12.7, hct 40.1, plt 267, na 138, k 4.1, bun 27, cr 1.8  Assessment/plan  Type 2 dm with ckd a1c suggestive of improved diabetic control with reduced a1c from 9.2 to 7.9. Continue lantus 54 u am and 52 u pm with premeal insulin.  Goal a1c 7 for him. Continue lipitor and  aspirin. Off ACEI with his CKD stage 3 . Eye and foot exam are up to date. Continue his eye drops travatan and betoptic. Monitor cbg. Check urine for microalbuminuria  Hyperlipidemia ldl at goal, decrease lipitor to 10 mg daily and monitor lipid panel q 6 month  Age related osteoporosis Continue  vitamin d 2000 u dialy, fall precautions  Mixed dementia with behavioral disturbance Continue aricept and namenda xr for now. Continue depakote sprinkles.monitor for decline, provide supportive care, fall precautions and pressure ulcer prophylaxis. Weight has been stable. Also on prozac and followed by [facility].   Blanchie Serve, MD  [facility] [phone] (Monday-Friday 8 am - 5 pm) [phone] (afterhours)

## 2014-11-04 DIAGNOSIS — F329 Major depressive disorder, single episode, unspecified: Secondary | ICD-10-CM | POA: Insufficient documentation

## 2014-11-11 ENCOUNTER — Other Ambulatory Visit: Payer: Self-pay | Admitting: Internal Medicine

## 2014-11-11 DIAGNOSIS — R4182 Altered mental status, unspecified: Secondary | ICD-10-CM

## 2014-11-12 ENCOUNTER — Ambulatory Visit (HOSPITAL_COMMUNITY)
Admission: RE | Admit: 2014-11-12 | Discharge: 2014-11-12 | Disposition: A | Discharge status: Home / Self Care | Payer: Medicare Other | Source: Ambulatory Visit | Attending: Internal Medicine | Admitting: Internal Medicine

## 2014-11-12 DIAGNOSIS — G9389 Other specified disorders of brain: Secondary | ICD-10-CM | POA: Insufficient documentation

## 2014-11-12 DIAGNOSIS — R4182 Altered mental status, unspecified: Secondary | ICD-10-CM | POA: Diagnosis present

## 2014-11-12 DIAGNOSIS — J322 Chronic ethmoidal sinusitis: Secondary | ICD-10-CM | POA: Diagnosis not present

## 2014-11-13 ENCOUNTER — Other Ambulatory Visit: Payer: Self-pay

## 2014-11-13 ENCOUNTER — Non-Acute Institutional Stay (SKILLED_NURSING_FACILITY): Payer: Medicare Other | Admitting: Internal Medicine

## 2014-11-13 DIAGNOSIS — F0151 Vascular dementia with behavioral disturbance: Secondary | ICD-10-CM

## 2014-11-13 DIAGNOSIS — I639 Cerebral infarction, unspecified: Secondary | ICD-10-CM | POA: Diagnosis not present

## 2014-11-13 DIAGNOSIS — G309 Alzheimer's disease, unspecified: Secondary | ICD-10-CM | POA: Diagnosis not present

## 2014-11-13 MED ORDER — LORAZEPAM 0.5 MG PO TABS: ORAL_TABLET | ORAL | Status: AC

## 2014-11-13 MED ORDER — MORPHINE SULFATE (CONCENTRATE) 20 MG/ML PO SOLN: 5.0000 mg | ORAL | Status: AC | PRN

## 2014-11-13 NOTE — Progress Notes (Signed)
Patient ID: EWIN REHBERG, male   DOB: Sep 03, 1940, 74 y.o.   MRN: [id number]    [place]  Code Status: DNR  Chief Complaint  Patient presents with  . Medical Management of Chronic Issues   No Known Allergies   HPI 74 y.o. male patient is seen for routine visit. He had an acute decline over this last week with acute change in mental status, progressive left sided weakness. His family was contacted and wanted pt to be treated in the facility. He was started on iv hydration, kept npo and infection workup was done. He was placed on neuro check and continued to decline. There was family meeting yesterday and comfort care has now been decided upon. He has PMH of DM2, HLD, CHF, HTN, depression, glaucoma and dementia among others. He is seen in his room today. He is non verbal and not following commands. He appears comfortable and in no distress.  ROS Unable to obtain  Past medical history reviewed    Medication List       This list is accurate as of: 11/13/14  3:30 PM.  Always use your most recent med list.               acetaminophen 650 MG suppository  Commonly known as:  TYLENOL  Place 650 mg rectally every 8 (eight) hours as needed.     atropine 1 % ophthalmic solution  Place 2 drops under the tongue every 3 (three) hours as needed.     LORazepam 2 MG/ML concentrated solution  Commonly known as:  ATIVAN  Take 0.25 mg by mouth every 6 (six) hours as needed for anxiety.     morphine 20 MG/ML concentrated solution  Commonly known as:  ROXANOL  Take 5 mg by mouth every 2 (two) hours as needed for severe pain.        Physical exam BP 132/86 mmHg  Pulse 78  Temp(Src) 97 F (36.1 C)  Resp 18  Ht 5\' 10"  (1.778 m)  Wt 214 lb 8 oz (97.297 kg)  BMI 30.78 kg/m2  SpO2 99%  Wt Readings from Last 3 Encounters:  11/13/14 214 lb 8 oz (97.297 kg)  10/21/14 210 lb (95.255 kg)  09/09/14 212 lb (96.163 kg)    Constitutional: elderly male in no acute  distress.    HEENT: Normocephalic and atraumatic. No icterus. Oral mucosa moist. Poor dentition.   Neck: Supple and nontender. No lymphadenopathy. No JVD. Cardiac: Normal S1, S2. RRR.  Lungs: No respiratory distress. Scattered wheezing with rhonchi noted Abdomen: Audible bowel sounds in all quadrants. Soft, nontender, nondistended. No palpable mass.   Musculoskeletal: unable to assess Skin: Warm and dry.    Neurological: non verbal, unable to assess  Labs 11/11/14 wbc 9.1, hb 13.4, plt 263, na 146, k 4, bun 24, cr .68, glu 59, alb 3.2 09/16/14 wbc 7.4, hb 13.9, plt 289, na 141, k 4.3, bun 23, cr 1.69, glu 67, a1c 7.9 09/14/14 t.chol 162, ldl 97, hdl 50, tg 73 07/20/14 wbc 7.2, hb 13.3, hct 40.6, plt 288, valproic acid < 12.5 06/15/14 na 140, k 4.5, bun 21, cr 1.73, glu 96, cr cl 51 06/11/14 t.chol 132, ldl 79, hdl 41, a1c 9.2 03/18/14 na 137, k 4, bun 25, cr 1.6 03/17/14 urine microalbumin/ creatinine 1357 02/28/14 a1c 9.6 02/13/14 lft wnl 01/21/14 t.chol 167, hdl 50, ldl 100, tg 86 01/05/14 wbc 6.6, hb 12.7, hct 40.1, plt 267, na 138, k 4.1, bun  27, cr 1.8  Imaging 11/12/14 ct head wo contrast IMPRESSION: 1. Diffuse atrophy and moderate small vessel ischemic change throughout the periventricular white matter. No acute intracranial abnormality. 2. 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 3. Ethmoid and sphenoid sinusitis.    Assessment/plan  Acute CVA No acute changes seen in CT scan, no MRI brain performed. Clinical feature suggestive of acute stroke. Family has opted for comfort care with discontinuation of all stroke medication. Monitor clinically and to provide supportive care. Continue roxanol 5 mg q2h prn dyspnea and pain and atropine as needed to help with oral secretions. Sister at bedside.  Mixed dementia with behavioral disturbance Off all medication, complete assistance with ADLs to be provided. Continue skin care. Goal of  care is comfort.      Blanchie Serve, MD  [facility] [phone] (Monday-Friday 8 am - 5 pm) [phone] (afterhours)

## 2014-11-13 NOTE — Telephone Encounter (Signed)
Rx faxed to [facility] @ [phone], phone number [phone]  

## 2014-12-07 DEATH — deceased
